# Patient Record
Sex: Male | Born: 1945
Health system: Southern US, Community
[De-identification: ages and names within clinical notes are randomized; demographics above are authoritative.]

## PROBLEM LIST (undated history)

## (undated) DIAGNOSIS — I10 Essential (primary) hypertension: Secondary | ICD-10-CM

## (undated) DIAGNOSIS — I519 Heart disease, unspecified: Secondary | ICD-10-CM

## (undated) DIAGNOSIS — C801 Malignant (primary) neoplasm, unspecified: Secondary | ICD-10-CM

## (undated) DIAGNOSIS — I2510 Atherosclerotic heart disease of native coronary artery without angina pectoris: Secondary | ICD-10-CM

## (undated) HISTORY — PX: SHOULDER SURGERY: SHX246

## (undated) HISTORY — DX: Atherosclerotic heart disease of native coronary artery without angina pectoris: I25.10

## (undated) HISTORY — DX: Heart disease, unspecified: I51.9

---

## 2001-03-06 ENCOUNTER — Encounter: Payer: Self-pay | Admitting: *Deleted

## 2001-03-06 ENCOUNTER — Emergency Department (HOSPITAL_COMMUNITY): Admission: EM | Admit: 2001-03-06 | Discharge: 2001-03-06 | Payer: Self-pay | Admitting: *Deleted

## 2002-03-29 ENCOUNTER — Encounter: Payer: Self-pay | Admitting: Internal Medicine

## 2002-03-29 ENCOUNTER — Observation Stay (HOSPITAL_COMMUNITY): Admission: AD | Admit: 2002-03-29 | Discharge: 2002-03-30 | Payer: Self-pay | Admitting: Internal Medicine

## 2002-10-01 ENCOUNTER — Emergency Department (HOSPITAL_COMMUNITY): Admission: EM | Admit: 2002-10-01 | Discharge: 2002-10-01 | Payer: Self-pay | Admitting: *Deleted

## 2002-10-01 ENCOUNTER — Encounter: Payer: Self-pay | Admitting: *Deleted

## 2002-10-04 ENCOUNTER — Emergency Department (HOSPITAL_COMMUNITY): Admission: EM | Admit: 2002-10-04 | Discharge: 2002-10-04 | Payer: Self-pay | Admitting: *Deleted

## 2003-07-30 ENCOUNTER — Ambulatory Visit (HOSPITAL_COMMUNITY): Admission: RE | Admit: 2003-07-30 | Discharge: 2003-07-30 | Payer: Self-pay | Admitting: Family Medicine

## 2004-07-31 ENCOUNTER — Ambulatory Visit (HOSPITAL_COMMUNITY): Admission: RE | Admit: 2004-07-31 | Discharge: 2004-07-31 | Payer: Self-pay | Admitting: Family Medicine

## 2004-08-25 ENCOUNTER — Ambulatory Visit (HOSPITAL_COMMUNITY): Admission: RE | Admit: 2004-08-25 | Discharge: 2004-08-25 | Payer: Self-pay | Admitting: General Surgery

## 2008-11-30 ENCOUNTER — Emergency Department (HOSPITAL_COMMUNITY): Admission: EM | Admit: 2008-11-30 | Discharge: 2008-11-30 | Payer: Self-pay | Admitting: Emergency Medicine

## 2009-02-19 ENCOUNTER — Emergency Department (HOSPITAL_COMMUNITY): Admission: EM | Admit: 2009-02-19 | Discharge: 2009-02-19 | Payer: Self-pay | Admitting: Internal Medicine

## 2009-02-20 ENCOUNTER — Emergency Department (HOSPITAL_COMMUNITY): Admission: EM | Admit: 2009-02-20 | Discharge: 2009-02-20 | Payer: Self-pay | Admitting: Emergency Medicine

## 2010-04-27 LAB — BASIC METABOLIC PANEL
Calcium: 9.8 mg/dL (ref 8.4–10.5)
GFR calc Af Amer: >60 mL/min (ref >60)
Potassium: 3.3 mEq/L — ABNORMAL LOW (ref 3.5–5.1)
Sodium: 135 mEq/L (ref 135–145)

## 2010-04-27 LAB — CBC
Hemoglobin: 14.5 g/dL (ref 13.0–17.0)
MCV: 93.4 fL (ref 78.0–100.0)
Platelets: 179 10*3/uL (ref 150–400)
RDW: 13.2 % (ref 11.5–15.5)
WBC: 7.9 10*3/uL (ref 4.0–10.5)

## 2010-04-27 LAB — DIFFERENTIAL
Basophils Relative: 0 % (ref 0–1)
Neutrophils Relative %: 86 % — ABNORMAL HIGH (ref 43–77)

## 2010-05-15 LAB — CBC
HCT: 43.3 % (ref 39.0–52.0)
Hemoglobin: 15.2 g/dL (ref 13.0–17.0)
Platelets: 207 10*3/uL (ref 150–400)
RDW: 13.4 % (ref 11.5–15.5)
WBC: 13.7 10*3/uL — ABNORMAL HIGH (ref 4.0–10.5)

## 2010-05-15 LAB — COMPREHENSIVE METABOLIC PANEL
ALT: 43 U/L (ref 0–53)
AST: 33 U/L (ref 0–37)
Albumin: 4.3 g/dL (ref 3.5–5.2)
BUN: 11 mg/dL (ref 6–23)
Calcium: 9.7 mg/dL (ref 8.4–10.5)
Creatinine, Ser: 0.86 mg/dL (ref 0.4–1.5)
GFR calc non Af Amer: >60 mL/min (ref >60)
Glucose, Bld: 140 mg/dL — ABNORMAL HIGH (ref 70–99)
Potassium: 4.1 mEq/L (ref 3.5–5.1)
Total Protein: 7 g/dL (ref 6.0–8.3)

## 2010-05-15 LAB — DIFFERENTIAL
Basophils Absolute: 0 10*3/uL (ref 0.0–0.1)
Eosinophils Relative: 0 % (ref 0–5)
Monocytes Relative: 2 % — ABNORMAL LOW (ref 3–12)
Neutrophils Relative %: 93 % — ABNORMAL HIGH (ref 43–77)

## 2010-06-27 NOTE — Consult Note (Signed)
[name], [name]                        ACCOUNT NO.:  [id number]   MEDICAL RECORD NO.:  [id number]                   PATIENT TYPE:  OBV   LOCATION:  A301                                 FACILITY:  [facility]   PHYSICIAN:  Dennie Maizes, M.D.                DATE OF BIRTH:  26-Jan-1946   DATE OF CONSULTATION:  03/30/2002  DATE OF DISCHARGE:                                   CONSULTATION   REPORT TITLE:  UROLOGY CONSULTATION   REASON FOR CONSULTATION:  Right distal ureteral calculus with obstruction.   HISTORY OF PRESENT ILLNESS:  This 65 year old male has a past history of  recurrent urolithiasis.  He experienced severe right focal abdominal pain  yesterday.  He had relief of pain after some time.  He had recurrent severe  pain this morning associated with nausea.  He came to the emergency room for  further evaluation.  He had passed a stone several years ago.  The patient  felt the stone was typical for his stone pain he had before.  He was  evaluated by Dr. Malvin Johns for possible appendicitis.  The patient has been  admitted to the hospital for observation.   CT scan of the abdomen and pelvis with contrast revealed no evidence of  appendicitis.  The patient had bilateral renal calculi as well  a 5 x 3 mm  size right distal ureteral calculus with obstruction and mild  hydronephrosis.  I was asked to see the patient for further evaluation and  management.  The patient is not having any fever, chills, voiding  difficulty, or gross hematuria.  He has some nausea.  His pain is adequately  controlled with pain narcotics.   PAST MEDICAL HISTORY:  1. History of recurrent urolithiasis.  2. Hypertension.   MEDICATIONS:  Diovan 80 __________  one p.o. daily.   ALLERGIES:  PENICILLIN.   PHYSICAL EXAMINATION:  GENERAL:  The patient is comfortable after receiving  pain narcotics.  ABDOMEN:  Soft.  No palpable flank mass.  No CVA tenderness.  No local  abdominal tenderness.  Bladder not  palpable.  GENITOURINARY:  Penis and testes are normal.  RECTAL:  Not done.  Rectal examination by Dr. Malvin Johns revealed a normal  prostate.   ADMISSION LABORATORY DATA:  CBC:  WBC 9.7, hemoglobin 14.8, hematocrit 42.4.  BUN 20, creatinine 1.2.  Urinalysis revealed small blood, RBCs 7-10/hpf.   CT of the abdomen and pelvis revealed bilateral small renal calculi.  There  is a 5 x 3 mm size right distal ureteral calculus at the level of the  ureterosacral junction with obstruction and mild hydronephrosis.   IMPRESSIONS:  1. Right distal ureteral calculus with obstruction.  2. Right hydronephrosis.  3. Right renal colic.   PLAN:  1. Continue IV fluids and pain narcotics.  2. Discussed with the patient and his wife regarding management options.  He     has a  good chance of passing this stone spontaneously.  The patient wants     to wait and pass the stone.  3. The patient will be discharged in the a.m. if he is pain free.   Thanks for this consult.                                               Dennie Maizes, M.D.    SK/MEDQ  D:  03/29/2002  T:  03/29/2002  Job:  [id number]   cc:   Barbaraann Barthel, M.D.  [address]  [place]  [place] [address]  Fax: [phone]

## 2010-06-27 NOTE — Consult Note (Signed)
[name], [name]                        ACCOUNT NO.:  [id number]   MEDICAL RECORD NO.:  [id number]                   PATIENT TYPE:  EMS   LOCATION:  ED                                   FACILITY:  [facility]   PHYSICIAN:  Barbaraann Barthel, M.D.              DATE OF BIRTH:  24-Oct-1945   DATE OF CONSULTATION:  DATE OF DISCHARGE:                                   CONSULTATION   REASON FOR CONSULTATION:  Surgery was asked to see this 65 year old white  male for right lower quadrant pain.   CHIEF COMPLAINT:  Right lower quadrant pain and nausea.   HISTORY OF PRESENT MEDICAL ILLNESS:  The patient states that he has  approximately a 24-hour history of right lower quadrant pain.  This began  yesterday morning when he awoke.  He had some right lower quadrant pain.  He  drank a little V8 juice and the pain continued and got worse later on in the  morning and then abated.  This pain was described as crampy and localized to  the right lower quadrant.  This pain abated somewhat and he was able to eat  lunch and a little supper, however, he awoke the next morning with continued  pain and he sought attention in the emergency room.  Surgery was consulted  and responded immediately.   PHYSICAL EXAMINATION:  GENERAL:  Discloses a pleasant 65 year old white  male, uncomfortable, but in no acute distress.   VITAL SIGNS:  He is approximately 5 feet 7 inches, weighs 170 pounds.  His  blood pressure is 138/67, heart rate 52 per minute, respirations 16 per  minute.   HEENT:  The head is normocephalic.  Eyes, extraocular movements intact,  pupils equal, round, reactive to light and accommodation, there is no  conjunctive pallor or scleral injection.  Sclerae is a normal tincture.  There are no bruits auscultated and there is no adenopathy.   CHEST:  Clear to both anterior and posterior auscultation.   HEART:  Regular rhythm.   ABDOMEN:  Bowel sounds are diminished.  The patient has right lower  quadrant  pain with localized tenderness.  No hernias are appreciated.   RECTAL:  The prostate is smooth and stool is guaiac negative.   EXTREMITIES:  These are within normal limits.  The patient has had previous  left shoulder surgery.  Other than that, he is moving all extremities  purposefully.   REVIEW OF SYSTEMS:  GASTROINTESTINAL:  Right lower quadrant pain with nausea  and anorexia and no past history of hepatitis.  No past history of change in  bowel habits.  The patient moved his bowels today.  There was no diarrhea,  no bright red rectal bleeding or melena.  No past history of hepatitis.  No  past history of unexplained weight loss.  GU:  No history of dysuria.  The  patient has had a past history of nephrolithiasis in  the past, however, he  states that the pain he is having now is not at all like previous renal  colic that is known to him.  ENDOCRINE:  No history of diabetes or thyroid  disease.  CARDIOVASCULAR:  The patient is a nonsmoker, nondrinker.  He has a  history of hypertension.  He takes Diovan 80 mg/12.5 daily.  He has no past  history of myocardial infarction.  His EKG shows sinus bradycardia.  This  was after he was administered Demerol.  MUSCULOSKELETAL:  As stated, past  history of left shoulder surgery.   LABORATORY DATA:  The patient has a white count of 9.7 with an H&H of 14.8  and 42.4 with normal platelet count of 193,000 with 81 neutrophils noted.  Amylase is not elevated.  Lipase is also not elevated.  Liver function  studies and electrolytes are all grossly within normal limits.  The patient  has been sent by the emergency room physician to have a CT scan.  This is  pending at present.   IMPRESSION:  Acute appendicitis.  The patient has signs and symptoms, to me,  suggestive of this at approximately 24 hours duration.   PLAN:  Continue IV hydration.  We will initiate IV antibiotics.  We will  plan for a laparoscopic or open appendectomy as explained  in detail to the  family.  Informed consent was obtained and all questions were answered.                                               Barbaraann Barthel, M.D.    WB/MEDQ  D:  03/29/2002  T:  03/29/2002  Job:  [id number]

## 2010-06-27 NOTE — H&P (Signed)
NAMEKLAUS, CASTENEDA            ACCOUNT NO.:  [id number]   MEDICAL RECORD NO.:  [id number]          PATIENT TYPE:  AMB   LOCATION:                                FACILITY:  [facility]   PHYSICIAN:  Dalia Heading, M.D.  DATE OF BIRTH:  11-Oct-1945   DATE OF ADMISSION:  08/25/2004  DATE OF DISCHARGE:  LH                                HISTORY & PHYSICAL   CHIEF COMPLAINT:  Need for screening colonoscopy.   HISTORY OF PRESENT ILLNESS:  The patient is a 65 year old, white male who  was referred for endoscopic evaluation.  He needs a colonoscopy for  screening purposes.  He has had no abdominal pain, weight loss, nausea,  vomiting, diarrhea, constipation, melena or hematochezia have been noted.  He has never had a colonoscopy.  There is no family history of colon  carcinoma.   PAST MEDICAL HISTORY:  Hypertension.   PAST SURGICAL HISTORY:  Left shoulder surgery.   CURRENT MEDICATIONS:  Diovan.   ALLERGIES:  CLARINEX.   REVIEW OF SYSTEMS:  Noncontributory.   PHYSICAL EXAMINATION:  GENERAL:  Well-developed, well-nourished, white male  in no acute distress.  VITAL SIGNS:  Stable.  LUNGS:  Clear to auscultation with equal breath sounds bilaterally.  HEART:  Regular rate and rhythm without S3, S4 or murmurs.  ABDOMEN:  Soft, nontender, nondistended.  No hepatosplenomegaly or masses  are noted  RECTAL:  Deferred to the procedure.   IMPRESSION:  Need for screening colonoscopy.   PLAN:  The patient was scheduled for a colonoscopy on August 25, 2004.  The  risks and benefits of the procedure including bleeding and perforation were  fully explained to the patient gaining informed consent.       MAJ/MEDQ  D:  08/21/2004  T:  08/21/2004  Job:  [id number]   cc:   Corrie Mckusick, M.D.  Fax: [phone]

## 2010-06-27 NOTE — Procedures (Signed)
   [name], [name]                        ACCOUNT NO.:  [id number]   MEDICAL RECORD NO.:  [id number]                   PATIENT TYPE:  OBV   LOCATION:  A301                                 FACILITY:  [facility]   PHYSICIAN:  Edward L. Juanetta Gosling, M.D.             DATE OF BIRTH:  May 16, 1945   DATE OF PROCEDURE:  03/29/2002  DATE OF DISCHARGE:                                EKG INTERPRETATION   INTERPRETATION:  The rhythm is sinus rhythm with a bradycardic rate in the  40s.                                               Edward L. Juanetta Gosling, M.D.    ELH/MEDQ  D:  03/29/2002  T:  03/29/2002  Job:  [id number]

## 2010-08-01 IMAGING — CT CT HEAD W/O CM
1 series · 16 of 30 positions shown, 20 images · non-contrast
Comparison: None

CLINICAL DATA: High blood pressure.  Headache.

CT HEAD WITHOUT CONTRAST
TECHNIQUE: Contiguous axial images were obtained from the base of
the skull through the vertex without contrast.

[Series 2: headseq 4.8 h37s · axial · 0.45mm/px · z∈[+76,+236]mm · 16 of 36 slices shown, 20 images]
[im 2/36  brain]
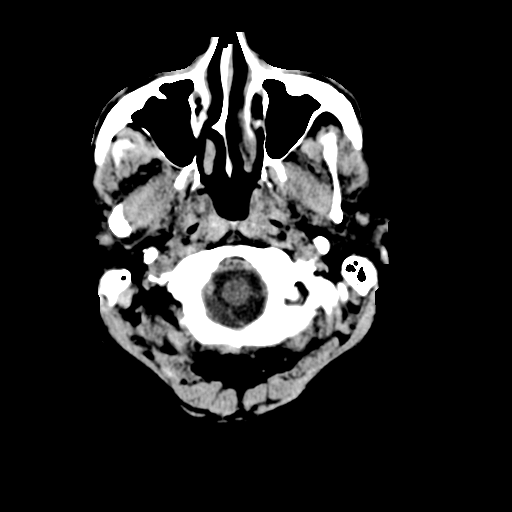
[im 2/36  bone]
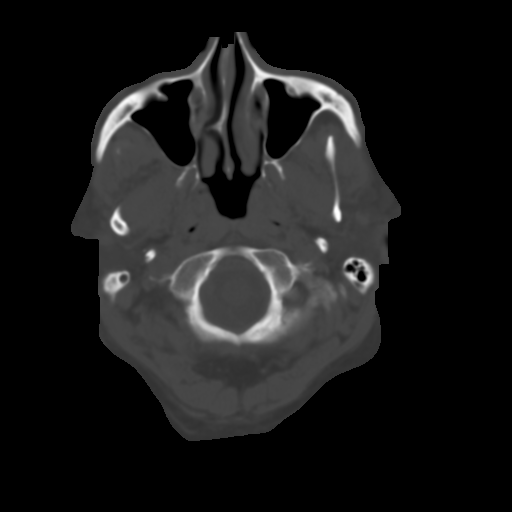
[im 4/36  brain]
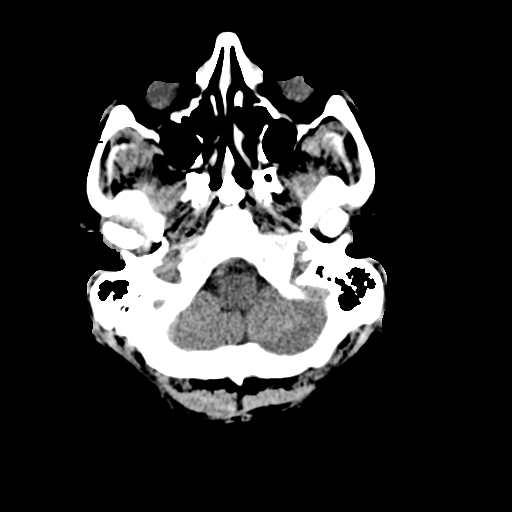
[im 7/36  brain]
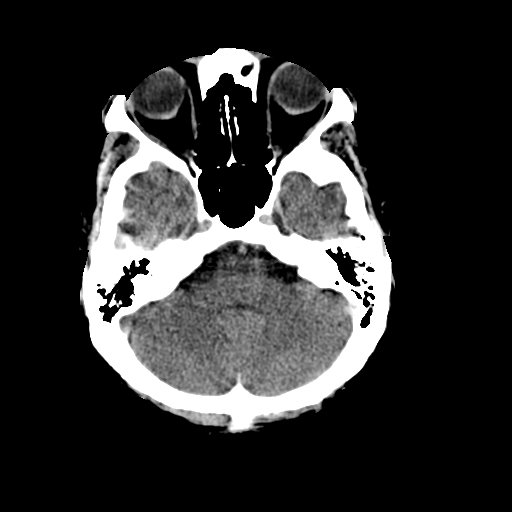
[im 9/36  brain]
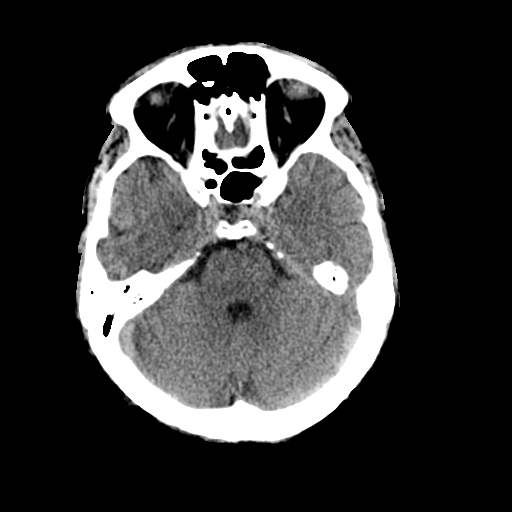
[im 10/36  brain]
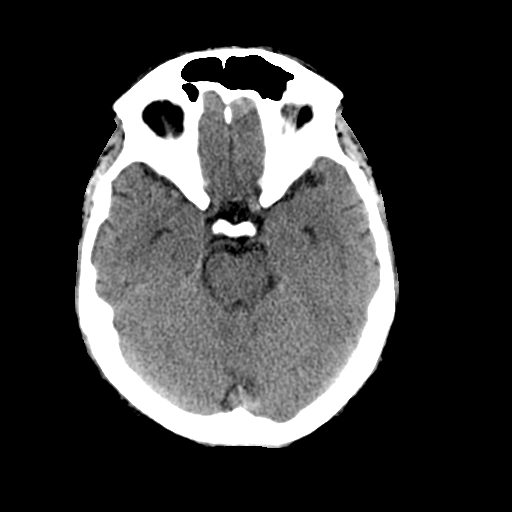
[im 10/36  bone]
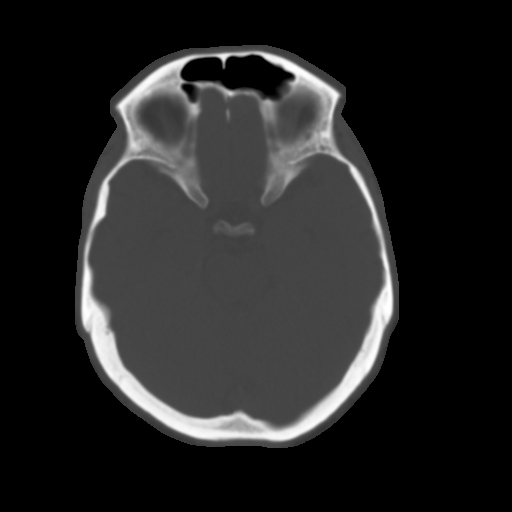
[im 13/36  brain]
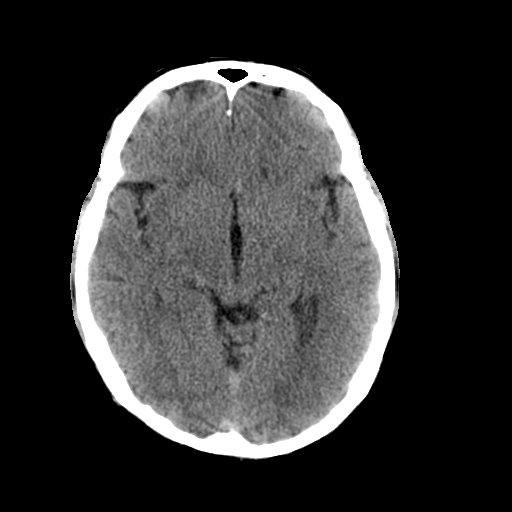
[im 15/36  brain]
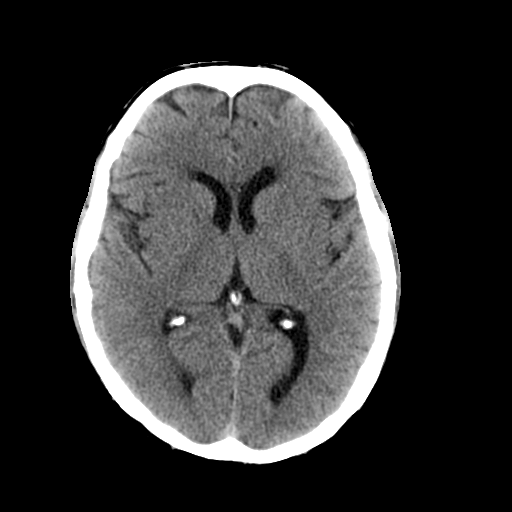
[im 17/36  brain]
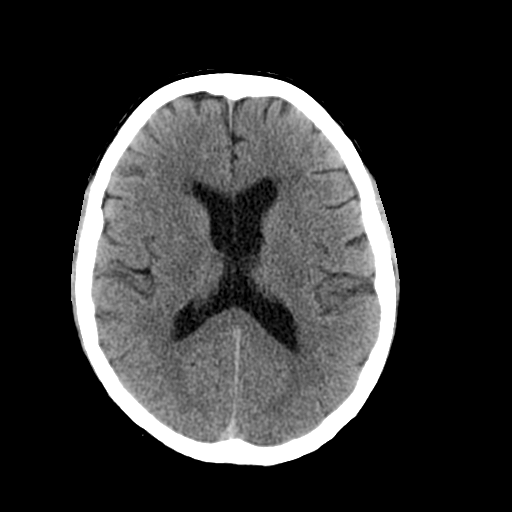
[im 19/36  brain]
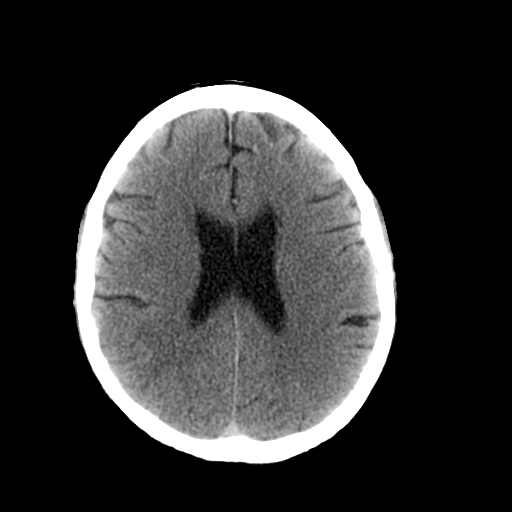
[im 19/36  bone]
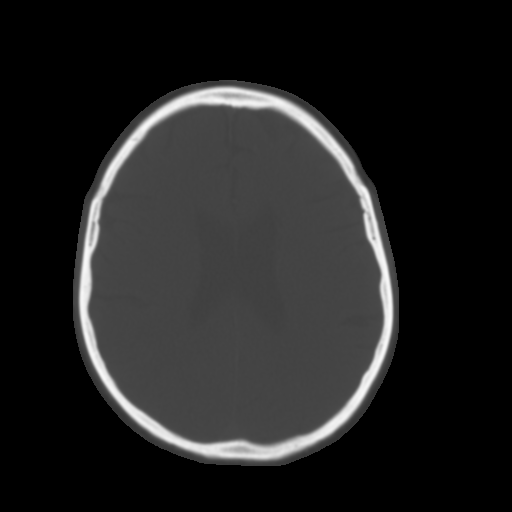
[im 21/36  brain]
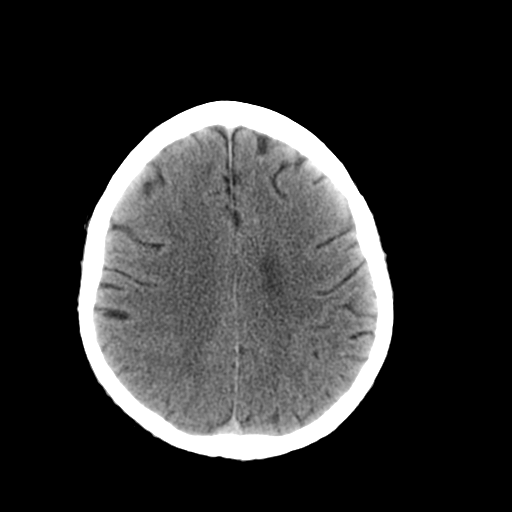
[im 23/36  brain]
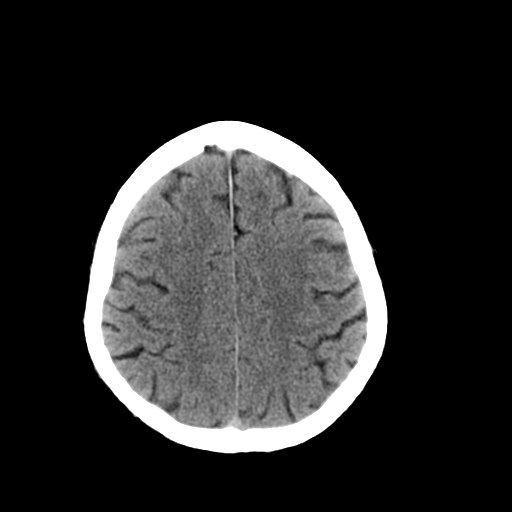
[im 26/36  brain]
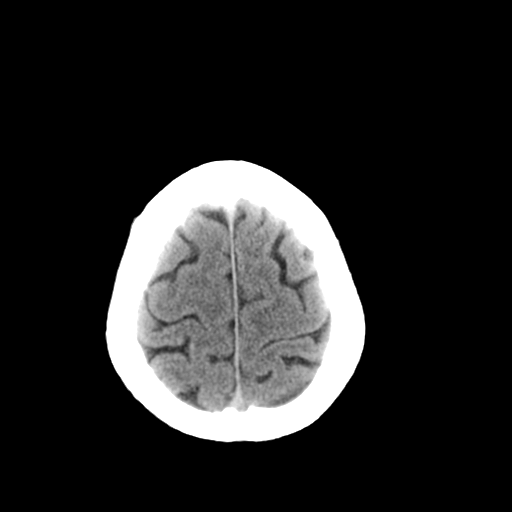
[im 27/36  brain]
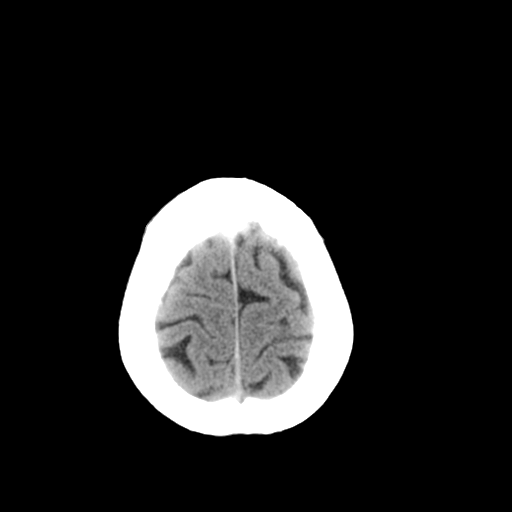
[im 27/36  bone]
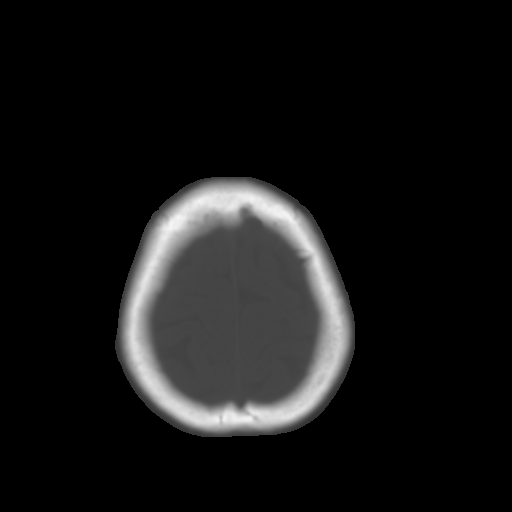
[im 29/36  brain]
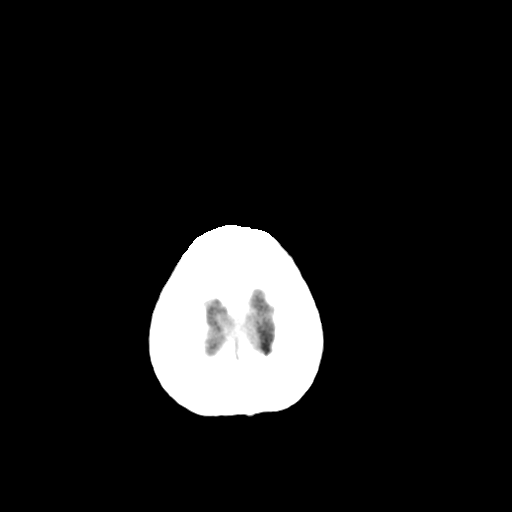
[im 32/36  brain]
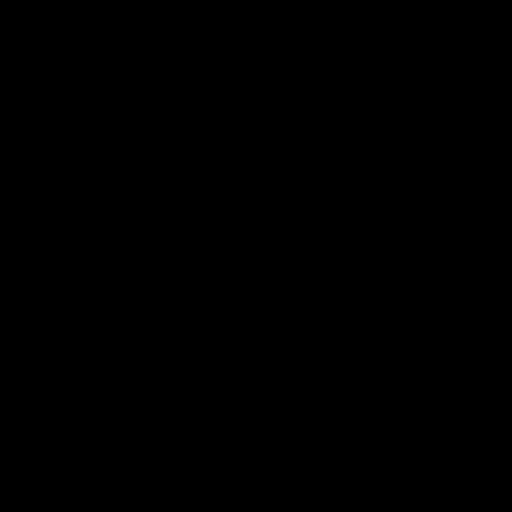
[im 34/36  brain]
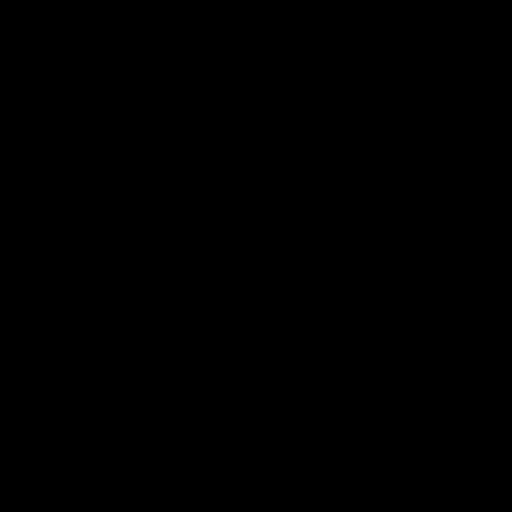

[16 of 30 positions shown; findings below may reference images not displayed]

FINDINGS: There is no intra or extra-axial fluid collection or mass
lesion.  The basilar cisterns and ventricles have a normal
appearance.  There is no CT evidence for acute infarction or
hemorrhage.

Bone windows show no calvarial fracture.
IMPRESSION: No evidence for acute intracranial abnormality.

## 2011-05-15 ENCOUNTER — Ambulatory Visit (HOSPITAL_COMMUNITY)
Admission: RE | Admit: 2011-05-15 | Discharge: 2011-05-15 | Disposition: A | Discharge status: Home / Self Care | Payer: Medicare Other | Source: Ambulatory Visit | Attending: Orthopedic Surgery | Admitting: Orthopedic Surgery

## 2011-05-15 DIAGNOSIS — IMO0001 Reserved for inherently not codable concepts without codable children: Secondary | ICD-10-CM | POA: Insufficient documentation

## 2011-05-15 DIAGNOSIS — M7540 Impingement syndrome of unspecified shoulder: Secondary | ICD-10-CM | POA: Insufficient documentation

## 2011-05-15 DIAGNOSIS — M25519 Pain in unspecified shoulder: Secondary | ICD-10-CM | POA: Insufficient documentation

## 2011-05-15 DIAGNOSIS — M6281 Muscle weakness (generalized): Secondary | ICD-10-CM | POA: Insufficient documentation

## 2011-05-15 DIAGNOSIS — M25619 Stiffness of unspecified shoulder, not elsewhere classified: Secondary | ICD-10-CM | POA: Insufficient documentation

## 2011-05-15 NOTE — Evaluation (Signed)
Occupational Therapy Evaluation  Patient Details  Name: [name] MRN: [id number] Date of Birth: Sep 29, 1945  Today's Date: 05/15/2011 Time: 4098-1191 Time Calculation (min): 40 min OT Eval 850-910 Manual Therapy 910-930 20'  Visit#: 1  of 24   Re-eval: 06/12/11  Assessment Diagnosis: Right SAD/DCE Surgical Date: 05/05/11 Next MD Visit: one month Prior Therapy: no  Past Medical History: No past medical history on file. Past Surgical History: No past surgical history on file.  Subjective Symptoms/Limitations Symptoms: S:  I had surgery on 05/05/11.  I want to get back to playing golf. Limitations: [name] reports that he hurt his right shoulder in 2008.  This winter, he slipped and fell on porch steps, catching his right arm in the hand rail.  He consulted with Dr. Rennis Chris and had surgery on his right shoulder on 05/05/11.  He has been referred to occupational therapy for evaluation and treatment.   Pain Assessment Currently in Pain?: Yes Pain Score:   3 Pain Location: Shoulder Pain Orientation: Right Pain Type: Surgical pain  Precautions/Restrictions   N/A  Prior Functioning  Home Living Additional Comments: lives with his wife Prior Function Comments: enjoys golfing, gardening, traveling  Assessment ADL/Vision/Perception ADL ADL Comments: R handed.  Can not reach overhead or into ext rotation  Cognition/Observation Cognition Overall Cognitive Status: Appears within functional limits for tasks assessed Observation/Other Assessments Other Assessments: 64/80 on UEFI   Additional Assessments RUE AROM (degrees) RUE Overall AROM Comments: assessed in supine, ER and IR with shoulder adducted strength deferred Right Shoulder Flexion  0-170: 125 Degrees Right Shoulder ABduction 0-140: 100 Degrees Right Shoulder Internal Rotation  0-70: 35 Degrees Right Shoulder External Rotation  0-90: 40 Degrees RUE PROM (degrees) Right Shoulder Flexion  0-170: 125  Degrees Right Shoulder ABduction 0-140: 125 Degrees Right Shoulder Internal Rotation  0-70: 35 Degrees Right Shoulder External Rotation  0-90: 40 Degrees Palpation Palpation: Min-mod fascial restrictions noted with healing scope incisions  Exercise/Treatments Supine Protraction: PROM;10 reps Horizontal ABduction: PROM;10 reps External Rotation: PROM;10 reps Internal Rotation: PROM;10 reps Flexion: PROM;10 reps ABduction: PROM;10 reps    Manual Therapy Manual Therapy: Myofascial release Myofascial Release: MFR and manual stretching to right upper arm, shoulder, and scapular Copeland to decrease pain and increase mobility.  910-930  Occupational Therapy Assessment and Plan OT Assessment and Plan Clinical Impression Statement: A:  Patient presents with decreased A/PROM and strength and increased pain and fascial restrictions causing decreased I with B/IADLs and leisure activities. Rehab Potential: Excellent OT Frequency: Min 3X/week OT Duration: 8 weeks OT Treatment/Interventions: Self-care/ADL training;Therapeutic exercise;Manual therapy;Modalities;Patient/family education OT Plan: P:  Skilled OT intervention to increase AROM and strength and decrease pain and restrictions.  Treatment Plan:  MFR and manual stretching to right shoulder.  Supine dowel progress to AROM as tolerated.  seated elev, ext, ret, row.  ball flex abd, thumbtacks, prot/ret//elev/dep, pulleys,.  Progress as tolerated.   Goals Short Term Goals Time to Complete Short Term Goals: 4 weeks Short Term Goal 1: Patient will be educated on a HEP. Short Term Goal 2: Patient will increase PROM in right shoulder to [name] for increased ability to swing golf club. Short Term Goal 3: Patient will increase right shoulder strength to 3+/5 for increased ability to lift gardening tools. Short Term Goal 4: Patient will decrease pain to 2/10 when reaching overhead. Short Term Goal 5: Patient will decrease fascial restrictions to minimal  in his right shoulder Copeland. Long Term Goals Time to Complete Long Term Goals:  8 weeks Long Term Goal 1: Patient will return to prior level of I with all B/IADLs, work, and leisure activities. Long Term Goal 2: Patient will increase right shoulder AROM to WNL for increased ability to swing golf club. Long Term Goal 3: Patient will have 5/5 strength in his right shoulder for increased ability to lift gardening tools. Long Term Goal 4: Patient will have 1/10 pain in his right shoulder when hitting golf balls. Long Term Goal 5: Patient will have trace fascial restrictions in his right shoulder Copeland.  Problem List Patient Active Problem List  Diagnoses  . Impingement syndrome of shoulder Copeland  . Pain in joint, shoulder Copeland  . Muscle weakness (generalized)    End of Session Activity Tolerance: Patient tolerated treatment well General Behavior During Session: [name] for tasks performed Cognition: [name] for tasks performed OT Plan of Care OT Home Exercise Plan: Towel slides and shoulder stretches.  GO  Functional Reporting Modifier  Current Status  [phone] - Carrying, Moving and Handling Objects CJ - At least 20% but less than 40% impaired, limited or restricted  Goal Status  [phone] - Carrying, Moving and Handling Objects CI - At least 1% but less than 20% impaired, limited or restricted   [name], OTR/L  05/15/2011, 2:39 PM  Physician Documentation Your signature is required to indicate approval of the treatment plan as stated above.  Please sign and either send electronically or make a copy of this report for your files and return this physician signed original.  Please mark one 1.__approve of plan  2. ___approve of plan with the following conditions.   ______________________________                                                          _____________________ Physician Signature                                                                                                              Date

## 2011-05-19 ENCOUNTER — Ambulatory Visit (HOSPITAL_COMMUNITY)
Admission: RE | Admit: 2011-05-19 | Discharge: 2011-05-19 | Disposition: A | Discharge status: Home / Self Care | Payer: Medicare Other | Source: Ambulatory Visit | Attending: Orthopedic Surgery | Admitting: Orthopedic Surgery

## 2011-05-19 DIAGNOSIS — M7540 Impingement syndrome of unspecified shoulder: Secondary | ICD-10-CM

## 2011-05-19 DIAGNOSIS — M25519 Pain in unspecified shoulder: Secondary | ICD-10-CM

## 2011-05-19 DIAGNOSIS — M6281 Muscle weakness (generalized): Secondary | ICD-10-CM

## 2011-05-19 NOTE — Progress Notes (Signed)
Occupational Therapy Treatment  Patient Details  Name: [name] MRN: [id number] Date of Birth: 1945/08/10  Today's Date: 05/19/2011 Time: 6213-0865 Time Calculation (min): 47 min Manual Therapy 850-910 20' Therapeutic Exercises 910-93 39'  Visit#: 2  of 24   Re-eval: 06/12/11    Subjective Symptoms/Limitations Symptoms: S:  I didnt have any problem with the exercises. Pain Assessment Currently in Pain?: Yes Pain Score:   3 Pain Location: Shoulder Pain Orientation: Right Pain Type: Surgical pain  Precautions/Restrictions     Exercise/Treatments Supine Protraction: PROM;AAROM;10 reps Horizontal ABduction: PROM;AAROM;10 reps External Rotation: PROM;AAROM;10 reps Internal Rotation: PROM;AAROM;10 reps Flexion: PROM;AAROM;10 reps ABduction: PROM;AAROM;10 reps Seated Elevation: AROM;10 reps Extension: AROM;10 reps Row: AROM;10 reps Pulleys Flexion: 2 minutes ABduction: 2 minutes Therapy Ball Flexion: 20 reps ABduction: 20 reps    Manual Therapy Manual Therapy: Myofascial release Myofascial Release: MFR and manual stretching to right upper arm, shoulder, and scapular region to decrease pain and increase mobility.  Scar release to each incision to decrease scar adherance and improve mobility.  850-910  Occupational Therapy Assessment and Plan OT Assessment and Plan Clinical Impression Statement: A:  Flexion and abduction painful at end range passive range of motion.   OT Plan: P:  Add thumbtacks, prot/ret//elev/dep, increase dowel reps as tolerated.   Goals Short Term Goals Time to Complete Short Term Goals: 4 weeks Short Term Goal 1: Patient will be educated on a HEP. Short Term Goal 1 Progress: Progressing toward goal Short Term Goal 2: Patient will increase PROM in right shoulder to [facility] for increased ability to swing golf club. Short Term Goal 2 Progress: Progressing toward goal Short Term Goal 3: Patient will increase right shoulder strength to 3+/5  for increased ability to lift gardening tools. Short Term Goal 3 Progress: Progressing toward goal Short Term Goal 4: Patient will decrease pain to 2/10 when reaching overhead. Short Term Goal 4 Progress: Progressing toward goal Short Term Goal 5: Patient will decrease fascial restrictions to minimal in his right shoulder region. Short Term Goal 5 Progress: Progressing toward goal Long Term Goals Time to Complete Long Term Goals: 8 weeks Long Term Goal 1: Patient will return to prior level of I with all B/IADLs, work, and leisure activities. Long Term Goal 1 Progress: Progressing toward goal Long Term Goal 2: Patient will increase right shoulder AROM to WNL for increased ability to swing golf club. Long Term Goal 2 Progress: Progressing toward goal Long Term Goal 3: Patient will have 5/5 strength in his right shoulder for increased ability to lift gardening tools. Long Term Goal 3 Progress: Progressing toward goal Long Term Goal 4: Patient will have 1/10 pain in his right shoulder when hitting golf balls. Long Term Goal 4 Progress: Progressing toward goal Long Term Goal 5: Patient will have trace fascial restrictions in his right shoulder region. Long Term Goal 5 Progress: Progressing toward goal  Problem List Patient Active Problem List  Diagnoses  . Impingement syndrome of shoulder region  . Pain in joint, shoulder region  . Muscle weakness (generalized)    End of Session Activity Tolerance: Patient tolerated treatment well General Behavior During Session: [facility] for tasks performed Cognition: [facility] for tasks performed  GO No functional reporting required  Shirlean Mylar, OTR/L  05/19/2011, 10:32 AM

## 2011-05-21 ENCOUNTER — Ambulatory Visit (HOSPITAL_COMMUNITY)
Admission: RE | Admit: 2011-05-21 | Discharge: 2011-05-21 | Disposition: A | Discharge status: Home / Self Care | Payer: Medicare Other | Source: Ambulatory Visit | Attending: Specialist | Admitting: Specialist

## 2011-05-21 NOTE — Progress Notes (Signed)
Occupational Therapy Treatment  Patient Details  Name: [name] MRN: [id number] Date of Birth: May 05, 1945  Today's Date: 05/21/2011 Time: 4098-1191 Time Calculation (min): 51 min Manual Therapy 478-295 27' Therapeutic Ex 621-308 24' Visit#: 3  of 24   Re-eval: 06/12/11    Subjective Symptoms/Limitations Symptoms: S:  I mowed the grass yesterday and I can feel that I worked my arm. Pain Assessment Currently in Pain?: Yes Pain Score:   2 Pain Location: Shoulder Pain Orientation: Right Pain Type: Surgical pain;Acute pain  Precautions/Restrictions     Exercise/Treatments Supine Protraction: PROM;AROM;10 reps Horizontal ABduction: PROM;AROM;10 reps External Rotation: PROM;AROM;10 reps Internal Rotation: PROM;AROM;10 reps Flexion: PROM;AROM;10 reps ABduction: PROM;AROM;10 reps Seated Elevation: AROM;15 reps Extension: AROM;15 reps Retraction: AROM;15 reps Row: AROM;15 reps   Pulleys Flexion: 2 minutes ABduction: 2 minutes Therapy Ball Flexion: 20 reps ABduction: 20 reps ROM / Strengthening / Isometric Strengthening Wall Wash: 1' Thumb Tacks: 1' Prot/Ret//Elev/Dep: 1'         Manual Therapy Manual Therapy: Myofascial release Myofascial Release: MFR and manual stretching to right upper  arm, shoulder, and scapular region to decrease pain and increase mobility.  Scar release to each incision to decrease scar adherarnce and improve mobility. [phone]  Occupational Therapy Assessment and Plan OT Assessment and Plan Clinical Impression Statement: A:  Patient transitioned from dowel rod AAROM to AROM in supine.  Flexion and abduction were most difficult movements and he required assistance with abduction. OT Plan: P:  Complete AROM abduction in supine without assistance.   Goals Short Term Goals Time to Complete Short Term Goals: 4 weeks Short Term Goal 1: Patient will be educated on a HEP. Short Term Goal 2: Patient will increase PROM in right shoulder  to [facility] for increased ability to swing golf club. Short Term Goal 3: Patient will increase right shoulder strength to 3+/5 for increased ability to lift gardening tools. Short Term Goal 4: Patient will decrease pain to 2/10 when reaching overhead. Short Term Goal 5: Patient will decrease fascial restrictions to minimal in his right shoulder region. Long Term Goals Time to Complete Long Term Goals: 8 weeks Long Term Goal 1: Patient will return to prior level of I with all B/IADLs, work, and leisure activities. Long Term Goal 2: Patient will increase right shoulder AROM to WNL for increased ability to swing golf club. Long Term Goal 3: Patient will have 5/5 strength in his right shoulder for increased ability to lift gardening tools. Long Term Goal 4: Patient will have 1/10 pain in his right shoulder when hitting golf balls. Long Term Goal 5: Patient will have trace fascial restrictions in his right shoulder region.  Problem List Patient Active Problem List  Diagnoses  . Impingement syndrome of shoulder region  . Pain in joint, shoulder region  . Muscle weakness (generalized)    End of Session Activity Tolerance: Patient tolerated treatment well General Behavior During Session: [facility] for tasks performed Cognition: [facility] for tasks performed  GO No functional reporting required  Shirlean Mylar, OTR/L  05/21/2011, 9:45 AM

## 2011-05-22 ENCOUNTER — Ambulatory Visit (HOSPITAL_COMMUNITY)
Admission: RE | Admit: 2011-05-22 | Discharge: 2011-05-22 | Disposition: A | Discharge status: Home / Self Care | Payer: Medicare Other | Source: Ambulatory Visit | Attending: Family Medicine | Admitting: Family Medicine

## 2011-05-22 ENCOUNTER — Ambulatory Visit (HOSPITAL_COMMUNITY): Payer: Self-pay | Admitting: Specialist

## 2011-05-22 DIAGNOSIS — M6281 Muscle weakness (generalized): Secondary | ICD-10-CM

## 2011-05-22 DIAGNOSIS — M7540 Impingement syndrome of unspecified shoulder: Secondary | ICD-10-CM

## 2011-05-22 DIAGNOSIS — M25519 Pain in unspecified shoulder: Secondary | ICD-10-CM

## 2011-05-22 NOTE — Progress Notes (Signed)
Occupational Therapy Treatment  Patient Details  Name: [name] MRN: [id number] Date of Birth: August 28, 1945  Today's Date: 05/22/2011 Time: 5784-6962 Time Calculation (min): 48 min Manual Therapy [id number]' Therapeutic Exercise 0102-7253 30' Visit#: 4  of 24   Re-eval: 06/12/11    Subjective Symptoms/Limitations Symptoms: S:  Its feeling pretty good. Pain Assessment Currently in Pain?: Yes Pain Score:   1 Pain Location: Shoulder Pain Orientation: Right Pain Type: Acute pain  Precautions/Restrictions     Exercise/Treatments   05/22/11 0700 Shoulder Exercises: Supine Protraction PROM;10 reps;AROM;12 reps Horizontal ABduction PROM;10 reps;AROM;12 reps External Rotation PROM;10 reps;AROM;12 reps Internal Rotation PROM;10 reps;AROM;12 reps Flexion PROM;10 reps;AROM;12 reps ABduction PROM;10 reps;AROM;12 reps Shoulder Exercises: Seated Elevation AROM;15 reps Extension AROM;15 reps Retraction AROM;15 reps Row AROM;15 reps Protraction AROM;10 reps Horizontal ABduction AROM;10 reps External Rotation AROM;10 reps Internal Rotation AROM;10 reps Flexion AROM;10 reps Abduction AROM;10 reps Shoulder Exercises: Therapy Ball Flexion 20 reps ABduction 20 reps Right/Left 5 reps Shoulder Exercises: ROM/Strengthening Wall Wash 2'  Thumb Tacks 1' X to V Arms x 5 Prot/Ret//Elev/Dep 1'       Manual Therapy Manual Therapy: Myofascial release Myofascial Release: MFR and manual stretching to right upper arm, shoulder, and scapular region to decrease pain and increase mobility.  Scar release to each incision [id number]  Occupational Therapy Assessment and Plan OT Assessment and Plan Clinical Impression Statement: A:  Able to complete AROM in supine without assistance this date.  Required min faciliation with abd in seated. OT Plan: P:  Increase to 10 reps of x to v arms.   Goals Short Term Goals Time to Complete Short Term Goals: 4 weeks Short Term Goal 1:  Patient will be educated on a HEP. Short Term Goal 2: Patient will increase PROM in right shoulder to [facility] for increased ability to swing golf club. Short Term Goal 3: Patient will increase right shoulder strength to 3+/5 for increased ability to lift gardening tools. Short Term Goal 4: Patient will decrease pain to 2/10 when reaching overhead. Short Term Goal 5: Patient will decrease fascial restrictions to minimal in his right shoulder region. Long Term Goals Time to Complete Long Term Goals: 8 weeks Long Term Goal 1: Patient will return to prior level of I with all B/IADLs, work, and leisure activities. Long Term Goal 2: Patient will increase right shoulder AROM to WNL for increased ability to swing golf club. Long Term Goal 3: Patient will have 5/5 strength in his right shoulder for increased ability to lift gardening tools. Long Term Goal 4: Patient will have 1/10 pain in his right shoulder when hitting golf balls. Long Term Goal 5: Patient will have trace fascial restrictions in his right shoulder region.  Problem List Patient Active Problem List  Diagnoses  . Impingement syndrome of shoulder region  . Pain in joint, shoulder region  . Muscle weakness (generalized)    End of Session Activity Tolerance: Patient tolerated treatment well General Behavior During Session: [facility] for tasks performed Cognition: [facility] for tasks performed  GO No functional reporting required  Shirlean Mylar, OTR/L  05/22/2011, 4:09 PM

## 2011-05-25 ENCOUNTER — Ambulatory Visit (HOSPITAL_COMMUNITY)
Admission: RE | Admit: 2011-05-25 | Discharge: 2011-05-25 | Disposition: A | Discharge status: Home / Self Care | Payer: Medicare Other | Source: Ambulatory Visit | Attending: Occupational Therapy | Admitting: Occupational Therapy

## 2011-05-25 DIAGNOSIS — M7540 Impingement syndrome of unspecified shoulder: Secondary | ICD-10-CM

## 2011-05-25 DIAGNOSIS — M6281 Muscle weakness (generalized): Secondary | ICD-10-CM

## 2011-05-25 DIAGNOSIS — M25519 Pain in unspecified shoulder: Secondary | ICD-10-CM

## 2011-05-25 NOTE — Progress Notes (Signed)
Occupational Therapy Treatment  Patient Details  Name: ROALD LUKACS MRN: [id number] Date of Birth: 1945-12-25  Today's Date: 05/25/2011 Time: 7829-5621 Time Calculation (min): 44 min Manual Therapy [phone] 29' Therapeutic Exercise [id number] 14'  Visit#: 5  of 25   Re-eval: 05/13/11 Assessment Diagnosis: Right SAD/DCE Surgical Date: 05/05/11  Subjective Symptoms/Limitations Symptoms: S:  It hurts me more at night. Pain Assessment Currently in Pain?: Yes Pain Score:   3 Pain Location: Shoulder Pain Orientation: Right Pain Type: Acute pain  Precautions/Restrictions     Exercise/Treatments Supine Protraction: PROM;10 reps;AROM;15 reps Horizontal ABduction: PROM;10 reps;AROM;15 reps External Rotation: PROM;10 reps;AROM;15 reps Internal Rotation: PROM;10 reps;AROM;15 reps Flexion: PROM;10 reps;AROM;15 reps ABduction: PROM;10 reps;AROM;15 reps Seated Protraction: AROM;12 reps Horizontal ABduction: AROM;12 reps External Rotation: AROM;12 reps Internal Rotation: AROM;12 reps Flexion: AROM;12 reps Abduction: AROM;12 reps Therapy Ball Flexion: 20 reps ABduction: 20 reps Right/Left: 5 reps ROM / Strengthening / Isometric Strengthening Wall Wash: 3' Thumb Tacks: 1' X to V Arms: x10       Manual Therapy Manual Therapy: Myofascial release Myofascial Release: MFR and manual stretching to right upper arm, shoulder, and scapular region to decrease pain and increase mobility. Scar release to each incision   Occupational Therapy Assessment and Plan OT Assessment and Plan Clinical Impression Statement: A:  Min to no assist with abd in seated position.  Added tband for scapular stab ex. Rehab Potential: Excellent OT Plan: P:  Increase reps with tband.   Goals Short Term Goals Time to Complete Short Term Goals: 4 weeks Short Term Goal 1: Patient will be educated on a HEP. Short Term Goal 2: Patient will increase PROM in right shoulder to [facility] for increased ability to  swing golf club. Short Term Goal 3: Patient will increase right shoulder strength to 3+/5 for increased ability to lift gardening tools. Short Term Goal 4: Patient will decrease pain to 2/10 when reaching overhead. Short Term Goal 5: Patient will decrease fascial restrictions to minimal in his right shoulder region. Long Term Goals Time to Complete Long Term Goals: 8 weeks Long Term Goal 1: Patient will return to prior level of I with all B/IADLs, work, and leisure activities. Long Term Goal 2: Patient will increase right shoulder AROM to WNL for increased ability to swing golf club. Long Term Goal 3: Patient will have 5/5 strength in his right shoulder for increased ability to lift gardening tools. Long Term Goal 4: Patient will have 1/10 pain in his right shoulder when hitting golf balls. Long Term Goal 5: Patient will have trace fascial restrictions in his right shoulder region.  Problem List Patient Active Problem List  Diagnoses  . Impingement syndrome of shoulder region  . Pain in joint, shoulder region  . Muscle weakness (generalized)    End of Session Activity Tolerance: Patient tolerated treatment well General Behavior During Session: [facility] for tasks performed Cognition: [facility] for tasks performed  GO No functional reporting required   Deantre Bourdon L. Khadeja Abt, COTA/L  05/25/2011, 12:08 PM

## 2011-05-26 ENCOUNTER — Ambulatory Visit (HOSPITAL_COMMUNITY)
Admission: RE | Admit: 2011-05-26 | Discharge: 2011-05-26 | Disposition: A | Discharge status: Home / Self Care | Payer: Medicare Other | Source: Ambulatory Visit | Attending: Family Medicine | Admitting: Family Medicine

## 2011-05-26 DIAGNOSIS — M7540 Impingement syndrome of unspecified shoulder: Secondary | ICD-10-CM

## 2011-05-26 DIAGNOSIS — M6281 Muscle weakness (generalized): Secondary | ICD-10-CM

## 2011-05-26 DIAGNOSIS — M25519 Pain in unspecified shoulder: Secondary | ICD-10-CM

## 2011-05-26 NOTE — Progress Notes (Addendum)
Occupational Therapy Treatment  Patient Details  Name: [name] MRN: [id number] Date of Birth: 02/17/1945  Today's Date: 05/26/2011 Time: 4098-1191 Time Calculation (min): 53 min Manual Therapy [phone] 27' Therapeutic Exercise [phone] 25'  Visit#: 6  of 25   Re-eval: 06/12/11 Assessment Diagnosis: Right SAD/DCE Surgical Date: 05/05/11  Subjective Symptoms/Limitations Symptoms: S:  I think I slept on it last night again.  Precautions/Restrictions     Exercise/Treatments Supine Protraction: PROM;10 reps;AROM;15 reps Horizontal ABduction: PROM;10 reps;AROM;15 reps External Rotation: PROM;10 reps;AROM;15 reps Internal Rotation: PROM;10 reps;AROM;15 reps Flexion: PROM;10 reps;AROM;15 reps ABduction: PROM;10 reps;AROM;15 reps Seated Theraband Level (Shoulder Elevation): Level 2 (Red) Extension: Theraband;12 reps Theraband Level (Shoulder Extension): Level 2 (Red) Retraction: Theraband;12 reps Theraband Level (Shoulder Retraction): Level 2 (Red) Row: Theraband;12 reps Theraband Level (Shoulder Row): Level 2 (Red) Protraction: AROM;15 reps Horizontal ABduction: AROM;15 reps External Rotation: AROM;15 reps Internal Rotation: AROM;15 reps Flexion: AROM;15 reps Abduction: AROM;15 reps Therapy Ball Flexion: 20 reps ABduction: 20 reps Right/Left: 5 reps ROM / Strengthening / Isometric Strengthening UBE (Upper Arm Bike): 3' forward 3' backwards 1.0 Wall Wash: 3' Thumb Tacks: 1' X to V Arms: x10 Prot/Ret//Elev/Dep: 1'       Manual Therapy Manual Therapy: Myofascial release Myofascial Release: MFR and manual stretching to right upper arm, shoulder, and scapular region to decrease pain and increase mobility. Scar release to each incision   Occupational Therapy Assessment and Plan OT Assessment and Plan Clinical Impression Statement: A:  Added UBE.  Good release along ribs with MFR evidenced by increase in ABD PROM and decrease pain with ABD PROM Rehab  Potential: Excellent OT Plan: P:  Add w arms.   Goals Short Term Goals Time to Complete Short Term Goals: 4 weeks Short Term Goal 1: Patient will be educated on a HEP. Short Term Goal 2: Patient will increase PROM in right shoulder to [facility] for increased ability to swing golf club. Short Term Goal 3: Patient will increase right shoulder strength to 3+/5 for increased ability to lift gardening tools. Short Term Goal 4: Patient will decrease pain to 2/10 when reaching overhead. Short Term Goal 5: Patient will decrease fascial restrictions to minimal in his right shoulder region. Long Term Goals Time to Complete Long Term Goals: 8 weeks Long Term Goal 1: Patient will return to prior level of I with all B/IADLs, work, and leisure activities. Long Term Goal 2: Patient will increase right shoulder AROM to WNL for increased ability to swing golf club. Long Term Goal 3: Patient will have 5/5 strength in his right shoulder for increased ability to lift gardening tools. Long Term Goal 4: Patient will have 1/10 pain in his right shoulder when hitting golf balls. Long Term Goal 5: Patient will have trace fascial restrictions in his right shoulder region.  Problem List Patient Active Problem List  Diagnoses  . Impingement syndrome of shoulder region  . Pain in joint, shoulder region  . Muscle weakness (generalized)    End of Session Activity Tolerance: Patient tolerated treatment well General Behavior During Session: [facility] for tasks performed Cognition: [facility] for tasks performed  GO No functional reporting required   Dajanae Brophy L. Carrington Mullenax, COTA/L  05/26/2011, 9:46 AM

## 2011-05-28 ENCOUNTER — Ambulatory Visit (HOSPITAL_COMMUNITY)
Admission: RE | Admit: 2011-05-28 | Discharge: 2011-05-28 | Disposition: A | Discharge status: Home / Self Care | Payer: Medicare Other | Source: Ambulatory Visit | Attending: Orthopedic Surgery | Admitting: Orthopedic Surgery

## 2011-05-28 DIAGNOSIS — M25519 Pain in unspecified shoulder: Secondary | ICD-10-CM

## 2011-05-28 DIAGNOSIS — M6281 Muscle weakness (generalized): Secondary | ICD-10-CM

## 2011-05-28 DIAGNOSIS — M7540 Impingement syndrome of unspecified shoulder: Secondary | ICD-10-CM

## 2011-05-28 NOTE — Progress Notes (Signed)
Occupational Therapy Treatment  Patient Details  Name: [name] MRN: [id number] Date of Birth: 04/11/1945  Today's Date: 05/28/2011 Time: 3664-4034 Time Calculation (min): 60 min Manual Therapy [phone] 29' Therapeutic exercises 919-950 31' Visit#: 7  of 24   Re-eval: 06/12/11    Subjective Symptoms/Limitations Symptoms: S:  I still have alot of soreness in the posterior shoulder. Pain Assessment Currently in Pain?: Yes Pain Score:   2 Pain Location: Shoulder Pain Orientation: Right Pain Type: Acute pain  Precautions/Restrictions     O:  Exercise/Treatments Supine Protraction: PROM;10 reps;AROM;15 reps Horizontal ABduction: PROM;10 reps;AROM;15 reps External Rotation: PROM;10 reps;AROM;15 reps Internal Rotation: PROM;10 reps;AROM;15 reps Flexion: PROM;10 reps;AROM;15 reps ABduction: PROM;10 reps;AROM;15 reps Seated Extension: Theraband;15 reps (green) Retraction: Theraband;15 reps (green) Row: Theraband;15 reps (green) Protraction: AROM;15 reps Horizontal ABduction: AROM;15 reps External Rotation: AROM;15 reps Internal Rotation: AROM;15 reps Flexion: AROM;15 reps Abduction: AROM;15 reps Other Seated Exercises: ext and int rot green tband x 15 Therapy Ball Flexion: 20 reps ABduction: 20 reps Right/Left: 5 reps ROM / Strengthening / Isometric Strengthening UBE (Upper Arm Bike): 3' forward 3' backwards 1.0 Wall Wash: 4'  Thumb Tacks: 1' "W" Arms: x 12 X to V Arms: x 12 Prot/Ret//Elev/Dep: 1'      Manual Therapy Manual Therapy: Myofascial release Myofascial Release: MFR and manual stretching to right upper arm, shoulder, and scapular region to decrease pain and increase mobility.  Scar release to each incision.  [phone]  Occupational Therapy Assessment and Plan OT Assessment and Plan Clinical Impression Statement: A:  With MFR to posterior/lateral upper arm and scapular, increased passive abduction with less pain. OT Plan: P:  Increase PROM to [facility]  in supine and add 1# resistance in supine.   Goals Short Term Goals Time to Complete Short Term Goals: 4 weeks Short Term Goal 1: Patient will be educated on a HEP. Short Term Goal 2: Patient will increase PROM in right shoulder to [facility] for increased ability to swing golf club. Short Term Goal 3: Patient will increase right shoulder strength to 3+/5 for increased ability to lift gardening tools. Short Term Goal 4: Patient will decrease pain to 2/10 when reaching overhead. Short Term Goal 5: Patient will decrease fascial restrictions to minimal in his right shoulder region. Long Term Goals Time to Complete Long Term Goals: 8 weeks Long Term Goal 1: Patient will return to prior level of I with all B/IADLs, work, and leisure activities. Long Term Goal 2: Patient will increase right shoulder AROM to WNL for increased ability to swing golf club. Long Term Goal 3: Patient will have 5/5 strength in his right shoulder for increased ability to lift gardening tools. Long Term Goal 4: Patient will have 1/10 pain in his right shoulder when hitting golf balls. Long Term Goal 5: Patient will have trace fascial restrictions in his right shoulder region.  Problem List Patient Active Problem List  Diagnoses  . Impingement syndrome of shoulder region  . Pain in joint, shoulder region  . Muscle weakness (generalized)    End of Session Activity Tolerance: Patient tolerated treatment well General Behavior During Session: [facility] for tasks performed Cognition: [facility] for tasks performed  GO No functional reporting required  Shirlean Mylar, OTR/L  05/28/2011, 11:58 AM

## 2011-06-01 ENCOUNTER — Ambulatory Visit (HOSPITAL_COMMUNITY)
Admission: RE | Admit: 2011-06-01 | Discharge: 2011-06-01 | Disposition: A | Discharge status: Home / Self Care | Payer: Medicare Other | Source: Ambulatory Visit | Attending: Orthopedic Surgery | Admitting: Orthopedic Surgery

## 2011-06-01 DIAGNOSIS — M6281 Muscle weakness (generalized): Secondary | ICD-10-CM

## 2011-06-01 DIAGNOSIS — M7540 Impingement syndrome of unspecified shoulder: Secondary | ICD-10-CM

## 2011-06-01 DIAGNOSIS — M25519 Pain in unspecified shoulder: Secondary | ICD-10-CM

## 2011-06-01 NOTE — Progress Notes (Signed)
Occupational Therapy Treatment  Patient Details  Name: [name] MRN: [id number] Date of Birth: [birth year]  Today's Date: 06/01/2011 Time: 6962-9528 Time Calculation (min): 65 min Manual Therapy [phone] 24' Therapeutic Exercise 909-950 25' Visit#: 8  of 24   Re-eval: 06/12/11    Subjective Symptoms/Limitations Symptoms: S:  I played golf this weekend.  I just chipped and putted, but it felt good. Pain Assessment Currently in Pain?: Yes Pain Score:   2 Pain Orientation: Right Pain Type: Acute pain  Precautions/Restrictions     Exercise/Treatments Supine Protraction: PROM;Strengthening;10 reps Protraction Weight (lbs): 1 Horizontal ABduction: PROM;Strengthening;10 reps Horizontal ABduction Weight (lbs): 1 External Rotation: PROM;Strengthening;10 reps External Rotation Weight (lbs): 1 Internal Rotation: PROM;Strengthening;10 reps Internal Rotation Weight (lbs): 1 Flexion: PROM;Strengthening;10 reps Shoulder Flexion Weight (lbs): 1 ABduction: PROM;Strengthening;10 reps Shoulder ABduction Weight (lbs): 1 Seated Extension: Theraband;15 reps Theraband Level (Shoulder Extension): Level 3 (Green) Retraction: Theraband;15 reps Theraband Level (Shoulder Retraction): Level 3 (Green) Row: Theraband;15 reps Theraband Level (Shoulder Row): Level 3 (Green) Protraction: AROM;15 reps Horizontal ABduction: AROM;15 reps External Rotation: AROM;15 reps Internal Rotation: AROM;15 reps Flexion: AROM;15 reps Abduction: AROM;15 reps Other Seated Exercises: ext and int rot green tband x 15 Therapy Ball Flexion: 20 reps ABduction: 20 reps Right/Left: 5 reps ROM / Strengthening / Isometric Strengthening UBE (Upper Arm Bike): 3' forward 3' backwards 1.5 Wall Wash: 2' with 1# Thumb Tacks: 1' "W" Arms: x 15 X to V Arms: x15 Prot/Ret//Elev/Dep: 1'      Manual Therapy Manual Therapy: Myofascial release Myofascial Release: MFR and manual stretching to right upper arm,  shoulder, and scapular region to decrease pain and increase mobility.  Scar release to each incision.  [phone]  Occupational Therapy Assessment and Plan OT Assessment and Plan Clinical Impression Statement: A:  Added one pound to supine exercises and wall wash. OT Plan: P:  Attempt weight in seated as tolerated.   Goals Short Term Goals Time to Complete Short Term Goals: 4 weeks Short Term Goal 1: Patient will be educated on a HEP. Short Term Goal 1 Progress: Progressing toward goal Short Term Goal 2: Patient will increase PROM in right shoulder to [facility] for increased ability to swing golf club. Short Term Goal 2 Progress: Progressing toward goal Short Term Goal 3: Patient will increase right shoulder strength to 3+/5 for increased ability to lift gardening tools. Short Term Goal 3 Progress: Progressing toward goal Short Term Goal 4: Patient will decrease pain to 2/10 when reaching overhead. Short Term Goal 4 Progress: Progressing toward goal Short Term Goal 5: Patient will decrease fascial restrictions to minimal in his right shoulder region. Short Term Goal 5 Progress: Progressing toward goal Long Term Goals Time to Complete Long Term Goals: 8 weeks Long Term Goal 1: Patient will return to prior level of I with all B/IADLs, work, and leisure activities. Long Term Goal 1 Progress: Progressing toward goal Long Term Goal 2: Patient will increase right shoulder AROM to WNL for increased ability to swing golf club. Long Term Goal 2 Progress: Progressing toward goal Long Term Goal 3: Patient will have 5/5 strength in his right shoulder for increased ability to lift gardening tools. Long Term Goal 3 Progress: Progressing toward goal Long Term Goal 4: Patient will have 1/10 pain in his right shoulder when hitting golf balls. Long Term Goal 4 Progress: Progressing toward goal Long Term Goal 5: Patient will have trace fascial restrictions in his right shoulder region. Long Term Goal 5 Progress:  Progressing toward  goal  Problem List Patient Active Problem List  Diagnoses  . Impingement syndrome of shoulder region  . Pain in joint, shoulder region  . Muscle weakness (generalized)    End of Session Activity Tolerance: Patient tolerated treatment well General Behavior During Session: [facility] for tasks performed Cognition: [facility] for tasks performed  GO No functional reporting required  Shirlean Mylar, OTR/L  06/01/2011, 11:48 AM

## 2011-06-03 ENCOUNTER — Ambulatory Visit (HOSPITAL_COMMUNITY)
Admission: RE | Admit: 2011-06-03 | Discharge: 2011-06-03 | Disposition: A | Discharge status: Home / Self Care | Payer: Medicare Other | Source: Ambulatory Visit | Attending: Orthopedic Surgery | Admitting: Orthopedic Surgery

## 2011-06-03 DIAGNOSIS — M6281 Muscle weakness (generalized): Secondary | ICD-10-CM

## 2011-06-03 DIAGNOSIS — M7540 Impingement syndrome of unspecified shoulder: Secondary | ICD-10-CM

## 2011-06-03 DIAGNOSIS — M25519 Pain in unspecified shoulder: Secondary | ICD-10-CM

## 2011-06-03 NOTE — Progress Notes (Signed)
Occupational Therapy Treatment  Patient Details  Name: [name] MRN: [id number] Date of Birth: 18-Jul-1945  Today's Date: 06/03/2011 Time: 4098-1191 Time Calculation (min): 64 min Manual THerapy [phone] 25' Therapeutic Exercises 911-950 39' Visit#: 9  of 24   Re-eval: 06/12/11    Subjective Symptoms/Limitations Symptoms: S:  Its doing better, not as sore and I can roll over on it better with less pain. Pain Assessment Currently in Pain?: No/denies  Precautions/Restrictions     Exercise/Treatments Supine   Seated   Therapy Ball   ROM / Strengthening / Isometric Strengthening        Manual Therapy Manual Therapy: Myofascial release Myofascial Release: MFR and manual stretching to right upper arm, shoulder, and scapular region to decrease pain and increase mobility.  Scar release to each incision.  [phone]  Occupational Therapy Assessment and Plan OT Assessment and Plan Clinical Impression Statement: A:  Increased to full PROM and AROM in supine this date.  Added 1# to seated strengthening exercises. OT Plan: P:  DC flexion and abduction with ball.  Attempt cybex press and row. Give tband as HEP.   Goals Short Term Goals Time to Complete Short Term Goals: 4 weeks Short Term Goal 1: Patient will be educated on a HEP. Short Term Goal 2: Patient will increase PROM in right shoulder to [facility] for increased ability to swing golf club. Short Term Goal 3: Patient will increase right shoulder strength to 3+/5 for increased ability to lift gardening tools. Short Term Goal 4: Patient will decrease pain to 2/10 when reaching overhead. Short Term Goal 5: Patient will decrease fascial restrictions to minimal in his right shoulder region. Long Term Goals Time to Complete Long Term Goals: 8 weeks Long Term Goal 1: Patient will return to prior level of I with all B/IADLs, work, and leisure activities. Long Term Goal 2: Patient will increase right shoulder AROM to WNL for  increased ability to swing golf club. Long Term Goal 3: Patient will have 5/5 strength in his right shoulder for increased ability to lift gardening tools. Long Term Goal 4: Patient will have 1/10 pain in his right shoulder when hitting golf balls. Long Term Goal 5: Patient will have trace fascial restrictions in his right shoulder region.  Problem List Patient Active Problem List  Diagnoses  . Impingement syndrome of shoulder region  . Pain in joint, shoulder region  . Muscle weakness (generalized)    End of Session Activity Tolerance: Patient tolerated treatment well General Behavior During Session: [facility] for tasks performed Cognition: [facility] for tasks performed  GO No functional reporting required  Shirlean Mylar, OTR/L  06/03/2011, 11:12 AM

## 2011-06-05 ENCOUNTER — Ambulatory Visit (HOSPITAL_COMMUNITY)
Admission: RE | Admit: 2011-06-05 | Discharge: 2011-06-05 | Disposition: A | Discharge status: Home / Self Care | Payer: Medicare Other | Source: Ambulatory Visit | Attending: Family Medicine | Admitting: Family Medicine

## 2011-06-05 DIAGNOSIS — M25519 Pain in unspecified shoulder: Secondary | ICD-10-CM

## 2011-06-05 DIAGNOSIS — M7540 Impingement syndrome of unspecified shoulder: Secondary | ICD-10-CM

## 2011-06-05 DIAGNOSIS — M6281 Muscle weakness (generalized): Secondary | ICD-10-CM

## 2011-06-05 NOTE — Progress Notes (Signed)
Occupational Therapy Treatment  Patient Details  Name: [name] MRN: [id number] Date of Birth: 02-17-1945  Today's Date: 06/05/2011 Time: 5188-4166 Time Calculation (min): 49 min  Visit#: 10  of 24   Re-eval: 06/12/11 Manual Therapy [phone] 23' Therapeutic Exercise [phone] 25'    Subjective Symptoms/Limitations Symptoms: S:  It's not bad. I can sleep on that side some now Pain Assessment Currently in Pain?: Yes Pain Score:   2 Pain Location: Shoulder Pain Orientation: Right  Precautions/Restrictions     Exercise/Treatments Supine Protraction: PROM;Strengthening;10 reps Protraction Weight (lbs): 2# Horizontal ABduction: PROM;Strengthening;10 reps Horizontal ABduction Weight (lbs): 2# External Rotation: PROM;Strengthening;10 reps External Rotation Weight (lbs): 2# Internal Rotation: PROM;Strengthening;10 reps Internal Rotation Weight (lbs): 2# Flexion: PROM;Strengthening;10 reps Shoulder Flexion Weight (lbs): 2# ABduction: PROM;Strengthening;10 reps Shoulder ABduction Weight (lbs): 2# Seated Extension: Theraband;15 reps Theraband Level (Shoulder Extension): Level 3 (Green) Retraction: Theraband;15 reps Theraband Level (Shoulder Retraction): Level 3 (Green) Row: Theraband;15 reps Theraband Level (Shoulder Row): Level 3 (Green) Protraction: Strengthening;15 reps Protraction Weight (lbs): 1 Horizontal ABduction: Strengthening;10 reps Horizontal ABduction Weight (lbs): 1 External Rotation: Strengthening;10 reps;Theraband;15 reps Theraband Level (Shoulder External Rotation): Level 3 (Green) External Rotation Weight (lbs): 1 Internal Rotation: Strengthening;10 reps;Theraband;15 reps Theraband Level (Shoulder Internal Rotation): Level 3 (Green) Internal Rotation Weight (lbs): 1 Flexion: Strengthening;10 reps Flexion Weight (lbs): 1 Abduction: Strengthening;10 reps ABduction Weight (lbs): 1 Therapy Ball Flexion:  (d/c) ABduction:  (d/c) Right/Left: 5  reps ROM / Strengthening / Isometric Strengthening UBE (Upper Arm Bike): 3' forward 3' backwards 2.0 Wall Wash: time Thumb Tacks: time "W" Arms: 10 x with 1# X to V Arms: 10x with 1# Prot/Ret//Elev/Dep: 1'      Manual Therapy Manual Therapy: Myofascial release Myofascial Release: MFR and manual stretching to right upper arm, shoulder, and scapular region to decrease pain and increase mobility. Scar release to each incision.  Occupational Therapy Assessment and Plan OT Assessment and Plan Clinical Impression Statement: A:  Increased supine to 2# and added cybex press and row. Rehab Potential: Excellent OT Plan: P: Increase cybex to 2 plates.   Goals Short Term Goals Time to Complete Short Term Goals: 4 weeks Short Term Goal 1: Patient will be educated on a HEP. Short Term Goal 2: Patient will increase PROM in right shoulder to [facility] for increased ability to swing golf club. Short Term Goal 3: Patient will increase right shoulder strength to 3+/5 for increased ability to lift gardening tools. Short Term Goal 4: Patient will decrease pain to 2/10 when reaching overhead. Short Term Goal 5: Patient will decrease fascial restrictions to minimal in his right shoulder region. Long Term Goals Time to Complete Long Term Goals: 8 weeks Long Term Goal 1: Patient will return to prior level of I with all B/IADLs, work, and leisure activities. Long Term Goal 2: Patient will increase right shoulder AROM to WNL for increased ability to swing golf club. Long Term Goal 3: Patient will have 5/5 strength in his right shoulder for increased ability to lift gardening tools. Long Term Goal 4: Patient will have 1/10 pain in his right shoulder when hitting golf balls. Long Term Goal 5: Patient will have trace fascial restrictions in his right shoulder region.  Problem List Patient Active Problem List  Diagnoses  . Impingement syndrome of shoulder region  . Pain in joint, shoulder region  . Muscle  weakness (generalized)    End of Session Activity Tolerance: Patient tolerated treatment well General Behavior During Session: [facility] for tasks performed  Cognition: WFL for tasks performed OT Plan of Care OT Home Exercise Plan: TBand with handouts. Consulted and Agree with Plan of Care: Patient  GO No functional reporting required  Naira Standiford L. Cherene Dobbins, COTA/L  06/05/2011, 11:10 AM

## 2011-06-08 ENCOUNTER — Ambulatory Visit (HOSPITAL_COMMUNITY)
Admission: RE | Admit: 2011-06-08 | Discharge: 2011-06-08 | Disposition: A | Discharge status: Home / Self Care | Payer: Medicare Other | Source: Ambulatory Visit | Attending: Orthopedic Surgery | Admitting: Orthopedic Surgery

## 2011-06-08 DIAGNOSIS — M7540 Impingement syndrome of unspecified shoulder: Secondary | ICD-10-CM

## 2011-06-08 DIAGNOSIS — M6281 Muscle weakness (generalized): Secondary | ICD-10-CM

## 2011-06-08 DIAGNOSIS — M25519 Pain in unspecified shoulder: Secondary | ICD-10-CM

## 2011-06-08 NOTE — Progress Notes (Signed)
Occupational Therapy Treatment  Patient Details  Name: [name] MRN: [id number] Date of Birth: 1945/12/15  Today's Date: 06/08/2011 Time: 7829-5621 Time Calculation (min): 49 min Manual THerapy [phone] 22' Therapeutic Exercises [phone] 27'  Visit#: 11  of 24   Re-eval: 06/12/11    Subjective Symptoms/Limitations Symptoms: S:  Im doing ok. Pain Assessment Currently in Pain?: Yes Pain Score:   1 Pain Location: Shoulder Pain Orientation: Right Pain Type: Chronic pain;Acute pain  Precautions/Restrictions     Exercise/Treatments Supine Protraction: PROM;10 reps;Strengthening;12 reps Protraction Weight (lbs): 2# Horizontal ABduction: PROM;10 reps;Strengthening;12 reps Horizontal ABduction Weight (lbs): 2# External Rotation: PROM;10 reps;Strengthening;12 reps External Rotation Weight (lbs): 2# Internal Rotation: PROM;10 reps;Strengthening;12 reps Internal Rotation Weight (lbs): 2# Flexion: PROM;10 reps;Strengthening;12 reps Shoulder Flexion Weight (lbs): 2# ABduction: PROM;10 reps;Strengthening;12 reps Shoulder ABduction Weight (lbs): 2# Seated Protraction: Strengthening;10 reps Protraction Weight (lbs): 2 Horizontal ABduction: Strengthening;10 reps Horizontal ABduction Weight (lbs): 2 External Rotation: Strengthening;10 reps External Rotation Weight (lbs): 2 Internal Rotation: Strengthening;10 reps Internal Rotation Weight (lbs): 2 Flexion: Strengthening;10 reps Flexion Weight (lbs): 2 Abduction: Strengthening;10 reps ABduction Weight (lbs): 2 Therapy Ball Right/Left: 5 reps ROM / Strengthening / Isometric Strengthening UBE (Upper Arm Bike): 3' forward 3' backwards 2.0 Cybex Press: 15 reps;1.5 plate Cybex Row: 1.5 plate;15 reps Wall Wash: 2' wtih 2# Thumb Tacks: dc "W" Arms: 10 x with 2# X to V Arms: 10 x with 2# Proximal Shoulder Strengthening, Seated: 2# x10 Prot/Ret//Elev/Dep: dc      Manual Therapy Manual Therapy: Myofascial  release Myofascial Release: MFR and manual stretching to right upper arm, shoulder, and scapular region to decrease pain and increase mobility.  Scar release to each incision.  [phone]  Occupational Therapy Assessment and Plan OT Assessment and Plan Clinical Impression Statement: A:  Added proximal shoulder strengthening with 2#.  Transitioned to ER and IR strengthening in 90 abd as patient is having difficulty reaching his back pocket and this position will help him attain this goal. OT Plan: P:  Increase cybex to 2 plates.   Goals Short Term Goals Time to Complete Short Term Goals: 4 weeks Short Term Goal 1: Patient will be educated on a HEP. Short Term Goal 2: Patient will increase PROM in right shoulder to [facility] for increased ability to swing golf club. Short Term Goal 3: Patient will increase right shoulder strength to 3+/5 for increased ability to lift gardening tools. Short Term Goal 4: Patient will decrease pain to 2/10 when reaching overhead. Short Term Goal 5: Patient will decrease fascial restrictions to minimal in his right shoulder region. Long Term Goals Time to Complete Long Term Goals: 8 weeks Long Term Goal 1: Patient will return to prior level of I with all B/IADLs, work, and leisure activities. Long Term Goal 2: Patient will increase right shoulder AROM to WNL for increased ability to swing golf club. Long Term Goal 3: Patient will have 5/5 strength in his right shoulder for increased ability to lift gardening tools. Long Term Goal 4: Patient will have 1/10 pain in his right shoulder when hitting golf balls. Long Term Goal 5: Patient will have trace fascial restrictions in his right shoulder region.  Problem List Patient Active Problem List  Diagnoses  . Impingement syndrome of shoulder region  . Pain in joint, shoulder region  . Muscle weakness (generalized)    End of Session Activity Tolerance: Patient tolerated treatment well General Behavior During Session: [facility]  for tasks performed Cognition: [facility] for tasks performed  GO  No functional reporting required  Shirlean Mylar, OTR/L  06/08/2011, 9:40 AM

## 2011-06-10 ENCOUNTER — Ambulatory Visit (HOSPITAL_COMMUNITY)
Admission: RE | Admit: 2011-06-10 | Discharge: 2011-06-10 | Disposition: A | Discharge status: Home / Self Care | Payer: Medicare Other | Source: Ambulatory Visit | Attending: Family Medicine | Admitting: Family Medicine

## 2011-06-10 DIAGNOSIS — M25519 Pain in unspecified shoulder: Secondary | ICD-10-CM | POA: Insufficient documentation

## 2011-06-10 DIAGNOSIS — M6281 Muscle weakness (generalized): Secondary | ICD-10-CM

## 2011-06-10 DIAGNOSIS — IMO0001 Reserved for inherently not codable concepts without codable children: Secondary | ICD-10-CM | POA: Insufficient documentation

## 2011-06-10 DIAGNOSIS — M25619 Stiffness of unspecified shoulder, not elsewhere classified: Secondary | ICD-10-CM | POA: Insufficient documentation

## 2011-06-10 DIAGNOSIS — M7540 Impingement syndrome of unspecified shoulder: Secondary | ICD-10-CM

## 2011-06-10 NOTE — Progress Notes (Signed)
Occupational Therapy Treatment Patient Details  Name: [name] MRN: [id number] Date of Birth: 12/10/1945  Today's Date: 06/10/2011 Time: 4098-1191 OT Time Calculation (min): 35 min Manual Therapy [phone] 24' Therapeutic Exercises [phone] 10' Visit#: 12  of 24   Re-eval: 07/08/11     Subjective Symptoms/Limitations Symptoms: S:  Im ok. Pain Assessment Currently in Pain?: Yes Pain Score:   1 Pain Location: Shoulder Pain Orientation: Right Pain Type: Acute pain  Precautions/Restrictions   n/a  Exercise/Treatments Supine Protraction: PROM;10 reps Horizontal ABduction: PROM;10 reps External Rotation: PROM;10 reps Internal Rotation: PROM;10 reps Flexion: PROM;10 reps ABduction: PROM;10 reps      ROM / Strengthening / Isometric Strengthening UBE (Upper Arm Bike): 3' forward 3' backwards 2.0      Manual Therapy Manual Therapy: Myofascial release Myofascial Release: MFR and manual stretching to right upper arm, shoulder, and scapular region to decrease pain and increase mobility.  Scar release to each incision.  [phone]  Occupational Therapy Assessment and Plan OT Assessment and Plan Clinical Impression Statement: A:  Please see monthly progress note. OT Frequency: Min 3X/week OT Duration: 4 weeks OT Plan: P:  Resume therapeutic exercises, administer UEFI.   Goals Short Term Goals Time to Complete Short Term Goals: 4 weeks Short Term Goal 1: Patient will be educated on a HEP. Short Term Goal 1 Progress: Met Short Term Goal 2: Patient will increase PROM in right shoulder to [facility] for increased ability to swing golf club. Short Term Goal 2 Progress: Met Short Term Goal 3: Patient will increase right shoulder strength to 3+/5 for increased ability to lift gardening tools. Short Term Goal 3 Progress: Met Short Term Goal 4: Patient will decrease pain to 2/10 when reaching overhead. Short Term Goal 4 Progress: Met Short Term Goal 5: Patient will decrease fascial  restrictions to minimal in his right shoulder region. Short Term Goal 5 Progress: Met Long Term Goals Time to Complete Long Term Goals: 8 weeks Long Term Goal 1: Patient will return to prior level of I with all B/IADLs, work, and leisure activities. Long Term Goal 1 Progress: Progressing toward goal Long Term Goal 2: Patient will increase right shoulder AROM to WNL for increased ability to swing golf club. Long Term Goal 2 Progress: Progressing toward goal Long Term Goal 3: Patient will have 5/5 strength in his right shoulder for increased ability to lift gardening tools. Long Term Goal 3 Progress: Progressing toward goal Long Term Goal 4: Patient will have 1/10 pain in his right shoulder when hitting golf balls. Long Term Goal 4 Progress: Progressing toward goal Long Term Goal 5: Patient will have trace fascial restrictions in his right shoulder region. Long Term Goal 5 Progress: Progressing toward goal  Problem List Patient Active Problem List  Diagnoses  . Impingement syndrome of shoulder region  . Pain in joint, shoulder region  . Muscle weakness (generalized)    End of Session Activity Tolerance: Patient tolerated treatment well General Behavior During Session: [facility] for tasks performed Cognition: [facility] for tasks performed  GO No functional reporting required  Shirlean Mylar, OTR/L  06/10/2011, 9:37 AM

## 2011-06-12 ENCOUNTER — Ambulatory Visit (HOSPITAL_COMMUNITY)
Admission: RE | Admit: 2011-06-12 | Discharge: 2011-06-12 | Disposition: A | Discharge status: Home / Self Care | Payer: Medicare Other | Source: Ambulatory Visit | Attending: Orthopedic Surgery | Admitting: Orthopedic Surgery

## 2011-06-12 DIAGNOSIS — M25519 Pain in unspecified shoulder: Secondary | ICD-10-CM

## 2011-06-12 DIAGNOSIS — M6281 Muscle weakness (generalized): Secondary | ICD-10-CM

## 2011-06-12 DIAGNOSIS — M7540 Impingement syndrome of unspecified shoulder: Secondary | ICD-10-CM

## 2011-06-12 NOTE — Progress Notes (Signed)
Occupational Therapy Treatment Patient Details  Name: [name] MRN: [id number] Date of Birth: November 02, 1945  Today's Date: 06/12/2011 Time: 7829-5621 OT Time Calculation (min): 61 min Manual Therapy [phone] 22' Therapeutic Exercises [phone] 1' Visit#: 13  of 24   Re-eval: 07/08/11    Subjective Symptoms/Limitations Symptoms: S:  The MD was pleased with my progress. Pain Assessment Currently in Pain?: Yes Pain Score:   1 Pain Location: Shoulder Pain Orientation: Right Pain Type: Acute pain  Precautions/Restrictions   N/A  Exercise/Treatments Supine Protraction: PROM;10 reps;Strengthening;15 reps Protraction Weight (lbs): 2# Horizontal ABduction: PROM;10 reps;Strengthening;15 reps Horizontal ABduction Weight (lbs): 2# External Rotation: PROM;10 reps;Strengthening;15 reps External Rotation Weight (lbs): 2# Internal Rotation: PROM;10 reps;Strengthening;15 reps Internal Rotation Weight (lbs): 2# Flexion: PROM;10 reps;Strengthening;15 reps Shoulder Flexion Weight (lbs): 2# ABduction: PROM;10 reps;Strengthening;15 reps Shoulder ABduction Weight (lbs): 2# Seated Protraction: Strengthening;15 reps Protraction Weight (lbs): 2 Horizontal ABduction: Strengthening;15 reps Horizontal ABduction Weight (lbs): 2 External Rotation: Strengthening;15 reps External Rotation Weight (lbs): 2 Internal Rotation: Strengthening;15 reps Internal Rotation Weight (lbs): 2 Flexion: Strengthening;15 reps Flexion Weight (lbs): 2 Abduction: Strengthening;15 reps ABduction Weight (lbs): 2 Therapy Balls  Right/Left: 5 reps ROM / Strengthening / Isometric Strengthening UBE (Upper Arm Bike): 3' and 3' 2.5 Cybex Press: 2 plate;20 reps Cybex Row: 2 plate;20 reps Wall Wash: 3' with 2# Over Head Lace: 2# x 1' Wall Pushups: 10 reps "W" Arms: 15 with 2# X to V Arms: 15 with2# Graduated Retraction with Theraband: green x 3 Sustained Retraction with Theraband: green x 3      Manual  Therapy Manual Therapy: Myofascial release Myofascial Release: MFR and manual stretching to right upper arm, shoulder, and scapular region to decrease pain and increase mobility.  Scar release to each incision [phone]  Occupational Therapy Assessment and Plan OT Assessment and Plan Clinical Impression Statement: A:  Added sustained and progressive retraction with theraband and overhead lace. OT Plan: Increase to 3# with supine strengthening, attempt prone strengthening.  Administer UEFI   Goals Short Term Goals Time to Complete Short Term Goals: 4 weeks Short Term Goal 1: Patient will be educated on a HEP. Short Term Goal 2: Patient will increase PROM in right shoulder to [facility] for increased ability to swing golf club. Short Term Goal 3: Patient will increase right shoulder strength to 3+/5 for increased ability to lift gardening tools. Short Term Goal 4: Patient will decrease pain to 2/10 when reaching overhead. Short Term Goal 5: Patient will decrease fascial restrictions to minimal in his right shoulder region. Long Term Goals Time to Complete Long Term Goals: 8 weeks Long Term Goal 1: Patient will return to prior level of I with all B/IADLs, work, and leisure activities. Long Term Goal 2: Patient will increase right shoulder AROM to WNL for increased ability to swing golf club. Long Term Goal 3: Patient will have 5/5 strength in his right shoulder for increased ability to lift gardening tools. Long Term Goal 4: Patient will have 1/10 pain in his right shoulder when hitting golf balls. Long Term Goal 5: Patient will have trace fascial restrictions in his right shoulder region.  Problem List Patient Active Problem List  Diagnoses  . Impingement syndrome of shoulder region  . Pain in joint, shoulder region  . Muscle weakness (generalized)    End of Session Activity Tolerance: Patient tolerated treatment well General Behavior During Session: [facility] for tasks performed Cognition: [facility]  for tasks performed  GO No functional reporting required  Shirlean Mylar,  OTR/L  06/12/2011, 10:16 AM

## 2011-06-15 ENCOUNTER — Ambulatory Visit (HOSPITAL_COMMUNITY)
Admission: RE | Admit: 2011-06-15 | Discharge: 2011-06-15 | Disposition: A | Discharge status: Home / Self Care | Payer: Medicare Other | Source: Ambulatory Visit | Attending: Specialist | Admitting: Specialist

## 2011-06-15 DIAGNOSIS — M7540 Impingement syndrome of unspecified shoulder: Secondary | ICD-10-CM

## 2011-06-15 DIAGNOSIS — M6281 Muscle weakness (generalized): Secondary | ICD-10-CM

## 2011-06-15 DIAGNOSIS — M25519 Pain in unspecified shoulder: Secondary | ICD-10-CM

## 2011-06-15 NOTE — Progress Notes (Signed)
Occupational Therapy Treatment Patient Details  Name: [name] MRN: [id number] Date of Birth: 10-07-1945  Today's Date: 06/15/2011 Time: 7829-5621 OT Time Calculation (min): 54 min Manual Therapy [phone] 18' Therapeutic Exercises [phone] 36' Visit#: 14  of 24   Re-eval: 07/08/11     Subjective Symptoms/Limitations Symptoms: S:  Im doing pretty well. Pain Assessment Currently in Pain?: Yes Pain Score:   1 Pain Orientation: Right Pain Type: Acute pain  Precautions/Restrictions     Exercise/Treatments Supine Protraction: PROM;Strengthening;10 reps Protraction Weight (lbs): 3 Horizontal ABduction: PROM;Strengthening;10 reps Horizontal ABduction Weight (lbs): 3 External Rotation: PROM;Strengthening;10 reps External Rotation Weight (lbs): 3 Internal Rotation: PROM;Strengthening;10 reps Internal Rotation Weight (lbs): 3 Flexion: PROM;Strengthening;10 reps Shoulder Flexion Weight (lbs): 3 ABduction: PROM;Strengthening Shoulder ABduction Weight (lbs): 2 Seated Protraction: Strengthening;10 reps Protraction Weight (lbs): 3 Horizontal ABduction: Strengthening;10 reps Horizontal ABduction Weight (lbs): 3 External Rotation: Strengthening;10 reps External Rotation Weight (lbs): 3 Internal Rotation: Strengthening;10 reps Internal Rotation Weight (lbs): 3 Flexion: Strengthening;10 reps Flexion Weight (lbs): 3 Abduction: Strengthening;10 reps ABduction Weight (lbs): 2 Therapy Ball Right/Left:  (dc) ROM / Strengthening / Isometric Strengthening UBE (Upper Arm Bike): 3' and 3' 3.0 Cybex Press: 2 plate;20 reps Cybex Row: 2 plate;20 reps Wall Wash: 3' wtih 3# Over Head Lace: 2' with 3# Wall Pushups:  (resume next visit) "W" Arms: 15 with 2# X to V Arms: 15 with2# Graduated Retraction with Theraband: resume next visit Sustained Retraction with Theraband: resume next visit      Manual Therapy Manual Therapy: Myofascial release Myofascial Release: MFR and manual  stretching to right upper arm, shoulder, and scapular region to decrease pain and increase mobility.  Scar release to each incision.  [phone]  Occupational Therapy Assessment and Plan OT Assessment and Plan Clinical Impression Statement: A:  Increased to 3# with strengthening exercises except for abduction. OT Plan: P:  Resume missed exercises and attempt 3#  with abduction and administer UEFI.   Goals Short Term Goals Time to Complete Short Term Goals: 4 weeks Short Term Goal 1: Patient will be educated on a HEP. Short Term Goal 2: Patient will increase PROM in right shoulder to [facility] for increased ability to swing golf club. Short Term Goal 3: Patient will increase right shoulder strength to 3+/5 for increased ability to lift gardening tools. Short Term Goal 4: Patient will decrease pain to 2/10 when reaching overhead. Short Term Goal 5: Patient will decrease fascial restrictions to minimal in his right shoulder region. Long Term Goals Time to Complete Long Term Goals: 8 weeks Long Term Goal 1: Patient will return to prior level of I with all B/IADLs, work, and leisure activities. Long Term Goal 2: Patient will increase right shoulder AROM to WNL for increased ability to swing golf club. Long Term Goal 3: Patient will have 5/5 strength in his right shoulder for increased ability to lift gardening tools. Long Term Goal 4: Patient will have 1/10 pain in his right shoulder when hitting golf balls. Long Term Goal 5: Patient will have trace fascial restrictions in his right shoulder region.  Problem List Patient Active Problem List  Diagnoses  . Impingement syndrome of shoulder region  . Pain in joint, shoulder region  . Muscle weakness (generalized)    End of Session Activity Tolerance: Patient tolerated treatment well General Cognition: WFL for tasks performed  GO No functional reporting required  Shirlean Mylar, OTR/L  06/15/2011, 10:34 AM

## 2011-06-16 ENCOUNTER — Ambulatory Visit (HOSPITAL_COMMUNITY)
Admission: RE | Admit: 2011-06-16 | Discharge: 2011-06-16 | Disposition: A | Discharge status: Home / Self Care | Payer: Medicare Other | Source: Ambulatory Visit | Attending: Family Medicine | Admitting: Family Medicine

## 2011-06-16 DIAGNOSIS — M6281 Muscle weakness (generalized): Secondary | ICD-10-CM

## 2011-06-16 DIAGNOSIS — M25519 Pain in unspecified shoulder: Secondary | ICD-10-CM

## 2011-06-16 DIAGNOSIS — M7540 Impingement syndrome of unspecified shoulder: Secondary | ICD-10-CM

## 2011-06-16 NOTE — Progress Notes (Signed)
Occupational Therapy Treatment Patient Details  Name: [name] MRN: [id number] Date of Birth: July 14, 1945  Today's Date: 06/16/2011 Time: 0806-0900 OT Time Calculation (min): 54 min Manual Therapy [phone] 18' Therapeutic Exercises [phone] 36' Visit#: 1  of 24   Re-eval: 07/08/11     Subjective Symptoms/Limitations Symptoms: S:  I can put my billfold in my back pocket now.  That moveemnt out to the side still gets me a little bit. Pain Assessment Currently in Pain?: Yes Pain Score:   1 Pain Location: Shoulder Pain Orientation: Right Pain Type: Acute pain  Precautions/Restrictions     Exercise/Treatments Supine Protraction: PROM;10 reps;Strengthening;12 reps Protraction Weight (lbs): 3 Horizontal ABduction: PROM;10 reps;Strengthening;12 reps Horizontal ABduction Weight (lbs): 3 External Rotation: PROM;10 reps;Strengthening;12 reps External Rotation Weight (lbs): 3 Internal Rotation: PROM;10 reps;Strengthening;12 reps Internal Rotation Weight (lbs): 3 Flexion: PROM;10 reps;Strengthening;12 reps Shoulder Flexion Weight (lbs): 3 ABduction: PROM;10 reps;Strengthening;12 reps Shoulder ABduction Weight (lbs): 2 Seated Protraction: Strengthening;12 reps Protraction Weight (lbs): 3 Horizontal ABduction: Strengthening;12 reps Horizontal ABduction Weight (lbs): 3 External Rotation: Strengthening;12 reps External Rotation Weight (lbs): 3 Internal Rotation: Strengthening;12 reps Internal Rotation Weight (lbs): 3 Flexion: Strengthening;12 reps Flexion Weight (lbs): 3 Abduction: Strengthening;12 reps ABduction Weight (lbs): 2 ROM / Strengthening / Isometric Strengthening UBE (Upper Arm Bike): 3' and 3' 3.0 Cybex Press: 2.5 plate;15 reps Cybex Row: 2.5 plate;15 reps Wall Wash: 3' with 3# Over Head Lace: 3' with 3# Wall Pushups: 15 reps "W" Arms: 10 with 3# X to V Arms: 10 with 3# Graduated Retraction with Theraband: green x 3 Sustained Retraction with Theraband:  green x 3      Manual Therapy Manual Therapy: Myofascial release Myofascial Release: MFR and manual stretching to right upper arm, shoulder, and scapular region to decrease pain and increase mobility.  [phone]  Occupational Therapy Assessment and Plan OT Assessment and Plan Clinical Impression Statement: A:  UEFI  63/80 78% was 64/80 = 80%. OT Plan: P:  Attempt 3# with abduction   Goals Short Term Goals Time to Complete Short Term Goals: 4 weeks Short Term Goal 1: Patient will be educated on a HEP. Short Term Goal 2: Patient will increase PROM in right shoulder to [facility] for increased ability to swing golf club. Short Term Goal 3: Patient will increase right shoulder strength to 3+/5 for increased ability to lift gardening tools. Short Term Goal 4: Patient will decrease pain to 2/10 when reaching overhead. Short Term Goal 5: Patient will decrease fascial restrictions to minimal in his right shoulder region. Long Term Goals Time to Complete Long Term Goals: 8 weeks Long Term Goal 1: Patient will return to prior level of I with all B/IADLs, work, and leisure activities. Long Term Goal 1 Progress: Progressing toward goal Long Term Goal 2: Patient will increase right shoulder AROM to WNL for increased ability to swing golf club. Long Term Goal 2 Progress: Progressing toward goal Long Term Goal 3: Patient will have 5/5 strength in his right shoulder for increased ability to lift gardening tools. Long Term Goal 3 Progress: Progressing toward goal Long Term Goal 4: Patient will have 1/10 pain in his right shoulder when hitting golf balls. Long Term Goal 4 Progress: Progressing toward goal Long Term Goal 5: Patient will have trace fascial restrictions in his right shoulder region. Long Term Goal 5 Progress: Progressing toward goal  Problem List Patient Active Problem List  Diagnoses  . Impingement syndrome of shoulder region  . Pain in joint, shoulder region  .  Muscle weakness  (generalized)    End of Session Activity Tolerance: Patient tolerated treatment well General Behavior During Session: [facility] for tasks performed Cognition: [facility] for tasks performed  GO No functional reporting required  Shirlean Mylar, OTR/L  06/16/2011, 9:56 AM

## 2011-06-18 ENCOUNTER — Ambulatory Visit (HOSPITAL_COMMUNITY): Payer: Medicare Other | Admitting: Specialist

## 2011-06-19 ENCOUNTER — Ambulatory Visit (HOSPITAL_COMMUNITY)
Admission: RE | Admit: 2011-06-19 | Discharge: 2011-06-19 | Disposition: A | Discharge status: Home / Self Care | Payer: Medicare Other | Source: Ambulatory Visit | Attending: Family Medicine | Admitting: Family Medicine

## 2011-06-19 DIAGNOSIS — M6281 Muscle weakness (generalized): Secondary | ICD-10-CM

## 2011-06-19 DIAGNOSIS — M7540 Impingement syndrome of unspecified shoulder: Secondary | ICD-10-CM

## 2011-06-19 DIAGNOSIS — M25519 Pain in unspecified shoulder: Secondary | ICD-10-CM

## 2011-06-19 NOTE — Progress Notes (Signed)
Occupational Therapy Treatment Patient Details  Name: [name] MRN: [id number] Date of Birth: May 27, 1945  Today's Date: 06/19/2011 Time: 0806-0910 OT Time Calculation (min): 64 min Manual Therapy [phone] 24' Therapeutic Exercises 830-910 40' Visit#: 16  of 24   Re-eval: 07/08/11    Subjective Symptoms/Limitations Symptoms: S:  I tried to move my chicken coop yesterday, and I needed help. Pain Assessment Currently in Pain?: No/denies  Precautions/Restrictions   N/A  Exercise/Treatments Supine Protraction: PROM;10 reps;Strengthening;12 reps Protraction Weight (lbs): 3 Horizontal ABduction: PROM;10 reps;Strengthening;12 reps Horizontal ABduction Weight (lbs): 3 External Rotation: PROM;10 reps;Strengthening;12 reps External Rotation Weight (lbs): 3 Internal Rotation: PROM;10 reps;Strengthening;12 reps Internal Rotation Weight (lbs): 3 Flexion: PROM;10 reps;Strengthening;12 reps Shoulder Flexion Weight (lbs): 3 ABduction: PROM;10 reps;Strengthening;12 reps Shoulder ABduction Weight (lbs): 3 Seated Protraction: Strengthening;12 reps Protraction Weight (lbs): 3 Horizontal ABduction: Strengthening;12 reps Horizontal ABduction Weight (lbs): 3 External Rotation: Strengthening;12 reps External Rotation Weight (lbs): 3 Internal Rotation: Strengthening;12 reps Internal Rotation Weight (lbs): 3 Flexion: Strengthening;12 reps Flexion Weight (lbs): 3 Abduction: Strengthening;12 reps ABduction Weight (lbs): 3 ROM / Strengthening / Isometric Strengthening UBE (Upper Arm Bike): 3' and 3' 3.5 Cybex Press: 2.5 plate;20 reps Cybex Row: 2.5 plate;20 reps Wall Wash: 4' with 3# Over Head Lace: 4' with 3# Wall Pushups: 15 reps "W" Arms: 12 with 3# X to V Arms: 12 with 3# Graduated Retraction with Theraband: green x 5 Sustained Retraction with Theraband: green x 5      Manual Therapy Manual Therapy: Myofascial release Myofascial Release: MFR and manual stretching to  right upper arm, shoulder, and scapular region to decrease pain and increase mobility.  [phone]  Occupational Therapy Assessment and Plan OT Assessment and Plan Clinical Impairments Affecting Rehab Potential: A:  Able to complete abduction with 3# in supine and seated. OT Plan: P:  DC supine strengthening.   Goals Short Term Goals Time to Complete Short Term Goals: 4 weeks Short Term Goal 1: Patient will be educated on a HEP. Short Term Goal 1 Progress: Progressing toward goal Short Term Goal 2: Patient will increase PROM in right shoulder to [facility] for increased ability to swing golf club. Short Term Goal 2 Progress: Progressing toward goal Short Term Goal 3: Patient will increase right shoulder strength to 3+/5 for increased ability to lift gardening tools. Short Term Goal 3 Progress: Progressing toward goal Short Term Goal 4: Patient will decrease pain to 2/10 when reaching overhead. Short Term Goal 4 Progress: Progressing toward goal Short Term Goal 5: Patient will decrease fascial restrictions to minimal in his right shoulder region. Short Term Goal 5 Progress: Progressing toward goal Long Term Goals Time to Complete Long Term Goals: 8 weeks Long Term Goal 1: Patient will return to prior level of I with all B/IADLs, work, and leisure activities. Long Term Goal 1 Progress: Progressing toward goal Long Term Goal 2: Patient will increase right shoulder AROM to WNL for increased ability to swing golf club. Long Term Goal 2 Progress: Progressing toward goal Long Term Goal 3: Patient will have 5/5 strength in his right shoulder for increased ability to lift gardening tools. Long Term Goal 3 Progress: Progressing toward goal Long Term Goal 4: Patient will have 1/10 pain in his right shoulder when hitting golf balls. Long Term Goal 4 Progress: Progressing toward goal Long Term Goal 5: Patient will have trace fascial restrictions in his right shoulder region. Long Term Goal 5 Progress:  Progressing toward goal  Problem List Patient Active Problem List  Diagnoses  . Impingement syndrome of shoulder region  . Pain in joint, shoulder region  . Muscle weakness (generalized)    End of Session Activity Tolerance: Patient tolerated treatment well General Behavior During Session: [facility] for tasks performed Cognition: [facility] for tasks performed  GO No functional reporting required  Shirlean Mylar, OTR/L  06/19/2011, 9:40 AM

## 2011-06-23 ENCOUNTER — Ambulatory Visit (HOSPITAL_COMMUNITY)
Admission: RE | Admit: 2011-06-23 | Discharge: 2011-06-23 | Disposition: A | Discharge status: Home / Self Care | Payer: Medicare Other | Source: Ambulatory Visit | Attending: Family Medicine | Admitting: Family Medicine

## 2011-06-23 DIAGNOSIS — M25519 Pain in unspecified shoulder: Secondary | ICD-10-CM

## 2011-06-23 DIAGNOSIS — M6281 Muscle weakness (generalized): Secondary | ICD-10-CM

## 2011-06-23 DIAGNOSIS — M7540 Impingement syndrome of unspecified shoulder: Secondary | ICD-10-CM

## 2011-06-23 NOTE — Progress Notes (Signed)
Occupational Therapy Treatment Patient Details  Name: [name] MRN: [id number] Date of Birth: 1945-11-28  Today's Date: 06/23/2011 Time: 4098-1191 OT Time Calculation (min): 53 min Manual Therapy [phone] 25' Therapeutic Exercises [phone] 28' Visit#: 17  of 24   Re-eval: 07/08/11     Subjective Symptoms/Limitations Symptoms: S:  Its not as painful just tired. (discussing abduction). Pain Assessment Currently in Pain?: No/denies  Precautions/Restrictions   N/A  0:  Exercise/Treatments Supine Protraction: PROM;10 reps Horizontal ABduction: PROM;10 reps External Rotation: PROM;10 reps Internal Rotation: PROM;10 reps Flexion: PROM;10 reps ABduction: PROM;10 reps Seated Protraction: Strengthening;15 reps Protraction Weight (lbs): 3 Horizontal ABduction: Strengthening;15 reps Horizontal ABduction Weight (lbs): 3 External Rotation: Strengthening;15 reps External Rotation Weight (lbs): 3 Internal Rotation: Strengthening;15 reps Internal Rotation Weight (lbs): 3 Flexion: Strengthening;15 reps Flexion Weight (lbs): 3 Abduction: Strengthening;15 reps ABduction Weight (lbs): 3 ROM / Strengthening / Isometric Strengthening UBE (Upper Arm Bike): 3' and 3' 3.5 Cybex Press: 2.5 plate;20 reps Cybex Row: 2.5 plate;20 reps Wall Wash: 4' with 3# Over Head Lace: 4' with 3# Wall Pushups: 15 reps "W" Arms: 15 wtih 3# X to V Arms: 15 wtih 3# Graduated Retraction with Theraband: green x 5 Sustained Retraction with Theraband: green x 5      Manual Therapy Manual Therapy: Myofascial release Myofascial Release: MFR and manual stretching to right upper arm, shoulder, and scapular region to decrease pain and increase mobility.  [phone]  Occupational Therapy Assessment and Plan OT Assessment and Plan Clinical Impression Statement: A:  No pain with abduction strengthening, just fatiguing. OT Plan: P:  Add prone strengthening.    Goals Short Term Goals Time to Complete  Short Term Goals: 4 weeks Short Term Goal 1: Patient will be educated on a HEP. Short Term Goal 2: Patient will increase PROM in right shoulder to [facility] for increased ability to swing golf club. Short Term Goal 3: Patient will increase right shoulder strength to 3+/5 for increased ability to lift gardening tools. Short Term Goal 4: Patient will decrease pain to 2/10 when reaching overhead. Short Term Goal 5: Patient will decrease fascial restrictions to minimal in his right shoulder region. Long Term Goals Time to Complete Long Term Goals: 8 weeks Long Term Goal 1: Patient will return to prior level of I with all B/IADLs, work, and leisure activities. Long Term Goal 2: Patient will increase right shoulder AROM to WNL for increased ability to swing golf club. Long Term Goal 3: Patient will have 5/5 strength in his right shoulder for increased ability to lift gardening tools. Long Term Goal 4: Patient will have 1/10 pain in his right shoulder when hitting golf balls. Long Term Goal 5: Patient will have trace fascial restrictions in his right shoulder region.  Problem List Patient Active Problem List  Diagnoses  . Impingement syndrome of shoulder region  . Pain in joint, shoulder region  . Muscle weakness (generalized)    End of Session Activity Tolerance: Patient tolerated treatment well General Behavior During Session: [facility] for tasks performed Cognition: [facility] for tasks performed  GO No functional reporting required  Shirlean Mylar, OTR/L  06/23/2011, 10:50 AM

## 2011-06-25 ENCOUNTER — Ambulatory Visit (HOSPITAL_COMMUNITY)
Admission: RE | Admit: 2011-06-25 | Discharge: 2011-06-25 | Disposition: A | Discharge status: Home / Self Care | Payer: Medicare Other | Source: Ambulatory Visit | Attending: Family Medicine | Admitting: Family Medicine

## 2011-06-25 NOTE — Progress Notes (Signed)
Occupational Therapy Treatment Patient Details  Name: [name] MRN: [id number] Date of Birth: 04-Sep-1945  Today's Date: 06/25/2011 Time: 4098-1191 OT Time Calculation (min): 49 min Manual Therapy [phone] 15' Therapeutic Exercises [phone] 24' Visit#: 18  of 24   Re-eval: 07/08/11    Subjective Symptoms/Limitations Symptoms: S:  I hit a bag of balls yesterday and it felt fine. Pain Assessment Currently in Pain?: No/denies Pain Score: 0-No pain   O:  Exercise/Treatments Supine Protraction: PROM;10 reps Horizontal ABduction: PROM;10 reps External Rotation: PROM;10 reps Internal Rotation: PROM;10 reps Flexion: PROM;10 reps ABduction: PROM;10 reps Seated Protraction: Strengthening;15 reps Protraction Weight (lbs): 3 Horizontal ABduction: Strengthening;15 reps Horizontal ABduction Weight (lbs): 3 External Rotation: Strengthening;15 reps External Rotation Weight (lbs): 3 Internal Rotation: Strengthening;15 reps Internal Rotation Weight (lbs): 3 Flexion: Strengthening;15 reps Flexion Weight (lbs): 3 Abduction: Strengthening;15 reps ABduction Weight (lbs): 3 Prone  Retraction: Strengthening;10 reps Retraction Weight (lbs): 2 Flexion: Strengthening;10 reps Flexion Weight (lbs): 2 Extension: Strengthening;10 reps Extension Weight (lbs): 2 External Rotation: Strengthening;10 reps External Rotation Weight (lbs): 2 Internal Rotation: Strengthening;10 reps Internal Rotation Weight (lbs): 2 Horizontal ABduction 1: Strengthening;10 reps Horizontal ABduction 1 Weight (lbs): 2 Horizontal ABduction 2: Strengthening;10 reps Horizontal ABduction 2 Weight (lbs): 2 ROM / Strengthening / Isometric Strengthening UBE (Upper Arm Bike): 3' and 3' 3.5 Cybex Press: 3 plate;15 reps Cybex Row: 3 plate;15 reps Wall Wash: 2' with 4# Over Head Lace: 2' with 4# Wall Pushups: 20 reps "W" Arms: 15 with 3# X to V Arms: 15 with 3#      Manual Therapy Manual Therapy: Myofascial  release Myofascial Release: MFR and manual stretching to right upper arm, shoulder, and scapular region to decrease pain and increase mobility.  [phone]  Occupational Therapy Assessment and Plan OT Assessment and Plan Clinical Impression Statement: A:  Added prone strengthening and increased weight to 4# with sustained activity tolerance exercises. Transition focus of treatment to strengthening. OT Frequency: Min 2X/week OT Duration: 4 weeks OT Plan: P: Decrease frequency to 2 times a week.  Increase reps and duration with all exercises.   Goals Short Term Goals Time to Complete Short Term Goals: 4 weeks Short Term Goal 1: Patient will be educated on a HEP. Short Term Goal 1 Progress: Met Short Term Goal 2: Patient will increase PROM in right shoulder to [facility] for increased ability to swing golf club. Short Term Goal 2 Progress: Met Short Term Goal 3: Patient will increase right shoulder strength to 3+/5 for increased ability to lift gardening tools. Short Term Goal 3 Progress: Met Short Term Goal 4: Patient will decrease pain to 2/10 when reaching overhead. Short Term Goal 4 Progress: Met Short Term Goal 5: Patient will decrease fascial restrictions to minimal in his right shoulder region. Short Term Goal 5 Progress: Met Long Term Goals Time to Complete Long Term Goals: 8 weeks Long Term Goal 1: Patient will return to prior level of I with all B/IADLs, work, and leisure activities. Long Term Goal 1 Progress: Progressing toward goal Long Term Goal 2: Patient will increase right shoulder AROM to WNL for increased ability to swing golf club. Long Term Goal 2 Progress: Progressing toward goal Long Term Goal 3: Patient will have 5/5 strength in his right shoulder for increased ability to lift gardening tools. Long Term Goal 3 Progress: Progressing toward goal Long Term Goal 4: Patient will have 1/10 pain in his right shoulder when hitting golf balls. Long Term Goal 4 Progress: Progressing  toward goal  Long Term Goal 5: Patient will have trace fascial restrictions in his right shoulder region. Long Term Goal 5 Progress: Progressing toward goal  Problem List Patient Active Problem List  Diagnoses  . Impingement syndrome of shoulder region  . Pain in joint, shoulder region  . Muscle weakness (generalized)    End of Session Activity Tolerance: Patient tolerated treatment well General Behavior During Session: [facility] for tasks performed Cognition: [facility] for tasks performed  GO No functional reporting required  Shirlean Mylar, OTR/L  06/25/2011, 8:59 AM

## 2011-06-26 ENCOUNTER — Ambulatory Visit (HOSPITAL_COMMUNITY): Payer: Medicare Other | Admitting: Specialist

## 2011-06-30 ENCOUNTER — Ambulatory Visit (HOSPITAL_COMMUNITY)
Admission: RE | Admit: 2011-06-30 | Discharge: 2011-06-30 | Disposition: A | Discharge status: Home / Self Care | Payer: Medicare Other | Source: Ambulatory Visit | Attending: Family Medicine | Admitting: Family Medicine

## 2011-06-30 NOTE — Progress Notes (Signed)
Occupational Therapy Treatment Patient Details  Name: BRAEDAN MEUTH MRN: [id number] Date of Birth: Aug 05, 1945  Today's Date: 06/30/2011 Time: 4098-1191 OT Time Calculation (min): 55 min Manual Therapy [phone] 15' Therapeutic Exercise [phone] 40' Visit#: 19  of 24   Re-eval: 07/08/11    Subjective Symptoms/Limitations Symptoms: S:  My arm feels a little painful along the front (biceps tendon) when I use it now. Pain Assessment Currently in Pain?: Yes Pain Score:   1 Pain Location: Shoulder Pain Orientation: Right;Anterior Pain Type: Acute pain  Precautions/Restrictions     Exercise/Treatments   06/30/11 0700  Shoulder Exercises: Supine  Protraction PROM;10 reps  Horizontal ABduction PROM;10 reps  External Rotation PROM;10 reps  Internal Rotation PROM;10 reps  Flexion PROM;10 reps  ABduction PROM;10 reps  Shoulder Exercises: Seated  Protraction Strengthening;10 reps  Protraction Weight (lbs) 4  Horizontal ABduction Strengthening;10 reps  Horizontal ABduction Weight (lbs) 4  External Rotation Strengthening;10 reps  External Rotation Weight (lbs) 4  Internal Rotation Strengthening;10 reps  Internal Rotation Weight (lbs) 4  Flexion Strengthening;10 reps  Flexion Weight (lbs) 4  Abduction Strengthening;10 reps  ABduction Weight (lbs) 4  Shoulder Exercises: Prone  Retraction Strengthening;12 reps  Retraction Weight (lbs) 2  Flexion Strengthening;12 reps  Flexion Weight (lbs) 2  Extension Strengthening;12 reps  Extension Weight (lbs) 2  External Rotation Strengthening;12 reps  External Rotation Weight (lbs) 2  Internal Rotation Strengthening;12 reps  Internal Rotation Weight (lbs) 2  Horizontal ABduction 1 Strengthening;12 reps  Horizontal ABduction 1 Weight (lbs) 2  Horizontal ABduction 2 Strengthening;12 reps  Horizontal ABduction 2 Weight (lbs) 2  Shoulder Exercises: ROM/Strengthening  UBE (Upper Arm Bike) 3' and 3' 4.0  Cybex Press 3 plate;20 reps    Cybex Row 3 plate;20 reps  Wall Wash 3' with 4#  Over Head Lace 3' with 4#  Wall Pushups Other (comment) (25)  "W" Arms 10 with 4#  X to V Arms 10 with 4#  Graduated Retraction with Theraband blue x 3   Sustained Retraction with Theraband blue x 3           Manual Therapy Manual Therapy: Myofascial release Myofascial Release: MFR and manual stretching to right upper arm, shoulder, and scapular region to decrease pain and increase mobility [phone]  Occupational Therapy Assessment and Plan OT Assessment and Plan Clinical Impression Statement: A:  Increased time and reps with all strengthening exercises.  Noted increased pain in anterior shoulder with prone retraction. OT Plan: P:  Analyze golf swing and simulate with body blade in prep for returning to golf with less pain.   Goals Short Term Goals Time to Complete Short Term Goals: 4 weeks Short Term Goal 1: Patient will be educated on a HEP. Short Term Goal 2: Patient will increase PROM in right shoulder to [facility] for increased ability to swing golf club. Short Term Goal 3: Patient will increase right shoulder strength to 3+/5 for increased ability to lift gardening tools. Short Term Goal 4: Patient will decrease pain to 2/10 when reaching overhead. Short Term Goal 5: Patient will decrease fascial restrictions to minimal in his right shoulder region. Long Term Goals Time to Complete Long Term Goals: 8 weeks Long Term Goal 1: Patient will return to prior level of I with all B/IADLs, work, and leisure activities. Long Term Goal 2: Patient will increase right shoulder AROM to WNL for increased ability to swing golf club. Long Term Goal 3: Patient will have 5/5 strength in his right shoulder for increased  ability to lift gardening tools. Long Term Goal 4: Patient will have 1/10 pain in his right shoulder when hitting golf balls. Long Term Goal 5: Patient will have trace fascial restrictions in his right shoulder region.  Problem  List Patient Active Problem List  Diagnoses  . Impingement syndrome of shoulder region  . Pain in joint, shoulder region  . Muscle weakness (generalized)    End of Session Activity Tolerance: Patient tolerated treatment well General Behavior During Session: [facility] for tasks performed Cognition: [facility] for tasks performed  GO No functional reporting required  Shirlean Mylar, OTR/L  06/30/2011, 11:11 AM

## 2011-07-02 ENCOUNTER — Ambulatory Visit (HOSPITAL_COMMUNITY)
Admission: RE | Admit: 2011-07-02 | Discharge: 2011-07-02 | Disposition: A | Discharge status: Home / Self Care | Payer: Medicare Other | Source: Ambulatory Visit | Attending: Family Medicine | Admitting: Family Medicine

## 2011-07-02 DIAGNOSIS — M25519 Pain in unspecified shoulder: Secondary | ICD-10-CM

## 2011-07-02 DIAGNOSIS — M6281 Muscle weakness (generalized): Secondary | ICD-10-CM

## 2011-07-02 DIAGNOSIS — M7540 Impingement syndrome of unspecified shoulder: Secondary | ICD-10-CM

## 2011-07-02 NOTE — Evaluation (Signed)
Occupational Therapy Evaluation  Patient Details  Name: [name] MRN: [id number] Date of Birth: [birth year]  Today's Date: 07/02/2011 Time: 1191-4782 OT Time Calculation (min): 49 min  Manual Therapy: 956-213 14' Therapeutic Exercise: 820-855 35' Visit#: 20  of 24   Re-eval: 07/02/11    Subjective Symptoms/Limitations Symptoms: S: I golfed yesterday and did well. I feel ready to discharge today. Pain Assessment Currently in Pain?: No/denies Pain Score: 0-No pain  Additional Assessments RUE AROM (degrees) Right Shoulder Flexion  0-170: 148 Degrees Right Shoulder ABduction 0-170: 145 Degrees Right Shoulder Internal Rotation  0-90: 55 Degrees Right Shoulder External Rotation  0-90: 60 Degrees RUE Strength Right Shoulder Flexion: 5/5 Right Shoulder ABduction: 5/5 Right Shoulder Internal Rotation: 5/5 Right Shoulder External Rotation: 5/5     Exercise/Treatments Supine Protraction: PROM;10 reps Horizontal ABduction: PROM;10 reps External Rotation: PROM;10 reps Internal Rotation: PROM;10 reps Flexion: PROM;10 reps ABduction: PROM;10 reps Seated Protraction: Strengthening;15 reps Protraction Weight (lbs): 4 Horizontal ABduction: Strengthening;15 reps Horizontal ABduction Weight (lbs): 4 External Rotation: Strengthening;12 reps External Rotation Weight (lbs): 4 Internal Rotation: Strengthening;12 reps Internal Rotation Weight (lbs): 4 Flexion: Strengthening;12 reps Flexion Weight (lbs): 4 Abduction: Strengthening;12 reps ABduction Weight (lbs): 4 Prone  Retraction: Strengthening;15 reps Retraction Weight (lbs): 2 Flexion: Strengthening;15 reps Flexion Weight (lbs): 2 Extension: Strengthening;15 reps Extension Weight (lbs): 2 External Rotation: Strengthening;15 reps External Rotation Weight (lbs): 2 Internal Rotation: Strengthening;15 reps Internal Rotation Weight (lbs): 2 Horizontal ABduction 1: Strengthening;15 reps Horizontal ABduction 1 Weight  (lbs): 2 Horizontal ABduction 2: Strengthening;15 reps Horizontal ABduction 2 Weight (lbs): 2  ROM / Strengthening / Isometric Strengthening Wall Wash: 3\' 30"  with 4# Over Head Lace: 3' with 4# Pushups: 10 reps (modified ) "W" Arms: 12 with 4# X to V Arms: 12 with 4#      Manual Therapy Myofascial Release: MFR and manual stretching to right upper arm, shoulder, and scapular region to decrease pain and increase mobility [phone].  Occupational Therapy Assessment and Plan OT Assessment and Plan Clinical Impression Statement: A: Increased wall wash duration. Increased to modified pushup. Refer to D/C summary.  OT Plan: P: Discharge from OT services secondary to patient has met all goals and has returned to all daily activites and desired activities.    Goals Short Term Goals Time to Complete Short Term Goals: 4 weeks Short Term Goal 1: Patient will be educated on a HEP. Short Term Goal 2: Patient will increase PROM in right shoulder to [facility] for increased ability to swing golf club. Short Term Goal 3: Patient will increase right shoulder strength to 3+/5 for increased ability to lift gardening tools. Short Term Goal 4: Patient will decrease pain to 2/10 when reaching overhead. Short Term Goal 5: Patient will decrease fascial restrictions to minimal in his right shoulder region. Long Term Goals Time to Complete Long Term Goals: 8 weeks Long Term Goal 1: Patient will return to prior level of I with all B/IADLs, work, and leisure activities. Long Term Goal 1 Progress: Met Long Term Goal 2: Patient will increase right shoulder AROM to WNL for increased ability to swing golf club. Long Term Goal 2 Progress: Met Long Term Goal 3: Patient will have 5/5 strength in his right shoulder for increased ability to lift gardening tools. Long Term Goal 3 Progress: Met Long Term Goal 4: Patient will have 1/10 pain in his right shoulder when hitting golf balls. Long Term Goal 4 Progress: Met Long Term  Goal 5: Patient will have trace fascial  restrictions in his right shoulder region. Long Term Goal 5 Progress: Met  Problem List Patient Active Problem List  Diagnoses  . Impingement syndrome of shoulder region  . Pain in joint, shoulder region  . Muscle weakness (generalized)    End of Session Activity Tolerance: Patient tolerated treatment well General Behavior During Session: [facility] for tasks performed Cognition: [facility] for tasks performed OT Plan of Care OT Patient Instructions: Discussed continuation of current activity level.   Armandina Stammer, OTS Note reviewed by clinical instructor and accurately reflects treatment session. Shirlean Mylar, OTR/L  07/02/2011, 9:09 AM  Physician Documentation Your signature is required to indicate approval of the treatment plan as stated above.  Please sign and either send electronically or make a copy of this report for your files and return this physician signed original.  Please mark one 1.__approve of plan  2. ___approve of plan with the following conditions.   ______________________________                                                          _____________________ Physician Signature                                                                                                             Date

## 2011-07-03 ENCOUNTER — Ambulatory Visit (HOSPITAL_COMMUNITY): Payer: Medicare Other | Admitting: Specialist

## 2011-07-07 ENCOUNTER — Ambulatory Visit (HOSPITAL_COMMUNITY): Payer: Medicare Other | Admitting: Specialist

## 2011-07-09 ENCOUNTER — Ambulatory Visit (HOSPITAL_COMMUNITY): Payer: Medicare Other | Admitting: Specialist

## 2011-07-10 ENCOUNTER — Ambulatory Visit (HOSPITAL_COMMUNITY): Payer: Medicare Other | Admitting: Specialist

## 2012-07-10 DIAGNOSIS — C801 Malignant (primary) neoplasm, unspecified: Secondary | ICD-10-CM

## 2012-07-10 HISTORY — DX: Malignant (primary) neoplasm, unspecified: C80.1

## 2012-12-10 HISTORY — PX: PROSTATE SURGERY: SHX751

## 2014-06-12 DIAGNOSIS — E6609 Other obesity due to excess calories: Secondary | ICD-10-CM | POA: Diagnosis not present

## 2014-06-12 DIAGNOSIS — Z0000 Encounter for general adult medical examination without abnormal findings: Secondary | ICD-10-CM | POA: Diagnosis not present

## 2014-06-12 DIAGNOSIS — R7301 Impaired fasting glucose: Secondary | ICD-10-CM | POA: Diagnosis not present

## 2014-06-12 DIAGNOSIS — Z6832 Body mass index (BMI) 32.0-32.9, adult: Secondary | ICD-10-CM | POA: Diagnosis not present

## 2014-06-12 DIAGNOSIS — I10 Essential (primary) hypertension: Secondary | ICD-10-CM | POA: Diagnosis not present

## 2014-08-16 DIAGNOSIS — Z1211 Encounter for screening for malignant neoplasm of colon: Secondary | ICD-10-CM | POA: Diagnosis not present

## 2014-08-17 NOTE — H&P (Signed)
  NTS SOAP Note  Vital Signs:  Vitals as of: 04/16/1694: Systolic 789: Diastolic 89: Heart Rate 94: Temp 97.3F: Height 72ft 6in: Weight 201Lbs 0 Ounces: BMI 32.44  BMI : 32.44 kg/m2  Subjective: This 69 year old male presents for of need for screening TCS.  Last had a TCS over ten years ago.  No family h/o colon cancer.  Denies any gi complaints.  Review of Symptoms:  Constitutional:unremarkable   Head:unremarkable Eyes:unremarkable   Nose/Mouth/Throat:unremarkable Cardiovascular:  unremarkable Respiratory:unremarkable Gastrointestinal:  unremarkable   Genitourinary:unremarkable   Musculoskeletal:unremarkable Skin:unremarkable Hematolgic/Lymphatic:unremarkable   Allergic/Immunologic:unremarkable   Past Medical History:  Reviewed  Past Medical History  Surgical History: prostatectomy, shoulder surgeries Medical Problems: HTN Allergies: pcn Medications: hyzaar   Social History:Reviewed  Social History  Preferred Language: English Race:  White Ethnicity: Not Hispanic / Latino Age: 69 year Marital Status:  M Alcohol: no   Smoking Status: Never smoker reviewed on 08/16/2014 Functional Status reviewed on 08/16/2014 ------------------------------------------------ Bathing: Normal Cooking: Normal Dressing: Normal Driving: Normal Eating: Normal Managing Meds: Normal Oral Care: Normal Shopping: Normal Toileting: Normal Transferring: Normal Walking: Normal Cognitive Status reviewed on 08/16/2014 ------------------------------------------------ Attention: Normal Decision Making: Normal Language: Normal Memory: Normal Motor: Normal Perception: Normal Problem Solving: Normal Visual and Spatial: Normal   Family History:Reviewed  Family Health History Mother, Deceased; Healthy;  Father, Deceased; Healthy;     Objective Information: General:Well appearing, well nourished in no distress. Heart:RRR, no murmur Lungs:  CTA  bilaterally, no wheezes, rhonchi, rales.  Breathing unlabored. Abdomen:Soft, NT/ND, no HSM, no masses. deferred to procedure  Assessment:Need for screening TCS  Diagnoses: V76.51  Z12.11 Screening for malignant neoplasm of colon (Encounter for screening for malignant neoplasm of colon)  Procedures: 38101 - OFFICE OUTPATIENT NEW 20 MINUTES    Plan:  Scheduled for TCS on 08/28/14.   Patient Education:Alternative treatments to surgery were discussed with patient (and family).  Risks and benefits  of procedure including bleeding and perforation were fully explained to the patient (and family) who gave informed consent. Patient/family questions were addressed.  Follow-up:Pending Surgery

## 2014-08-21 DIAGNOSIS — D485 Neoplasm of uncertain behavior of skin: Secondary | ICD-10-CM | POA: Diagnosis not present

## 2014-08-21 DIAGNOSIS — L820 Inflamed seborrheic keratosis: Secondary | ICD-10-CM | POA: Diagnosis not present

## 2014-08-21 DIAGNOSIS — L818 Other specified disorders of pigmentation: Secondary | ICD-10-CM | POA: Diagnosis not present

## 2014-08-21 DIAGNOSIS — L720 Epidermal cyst: Secondary | ICD-10-CM | POA: Diagnosis not present

## 2014-08-21 DIAGNOSIS — L570 Actinic keratosis: Secondary | ICD-10-CM | POA: Diagnosis not present

## 2014-08-28 ENCOUNTER — Encounter (HOSPITAL_COMMUNITY): Payer: Self-pay | Admitting: *Deleted

## 2014-08-28 ENCOUNTER — Encounter (HOSPITAL_COMMUNITY)
Admission: RE | Disposition: A | Discharge status: Home / Self Care | Payer: Self-pay | Source: Ambulatory Visit | Attending: General Surgery

## 2014-08-28 ENCOUNTER — Ambulatory Visit (HOSPITAL_COMMUNITY)
Admission: RE | Admit: 2014-08-28 | Discharge: 2014-08-28 | Disposition: A | Discharge status: Home / Self Care | Payer: Medicare Other | Source: Ambulatory Visit | Attending: General Surgery | Admitting: General Surgery

## 2014-08-28 DIAGNOSIS — D125 Benign neoplasm of sigmoid colon: Secondary | ICD-10-CM | POA: Diagnosis not present

## 2014-08-28 DIAGNOSIS — K5730 Diverticulosis of large intestine without perforation or abscess without bleeding: Secondary | ICD-10-CM | POA: Diagnosis not present

## 2014-08-28 DIAGNOSIS — Z1211 Encounter for screening for malignant neoplasm of colon: Secondary | ICD-10-CM | POA: Diagnosis not present

## 2014-08-28 DIAGNOSIS — I10 Essential (primary) hypertension: Secondary | ICD-10-CM | POA: Insufficient documentation

## 2014-08-28 HISTORY — PX: COLONOSCOPY: SHX5424

## 2014-08-28 HISTORY — DX: Essential (primary) hypertension: I10

## 2014-08-28 HISTORY — DX: Malignant (primary) neoplasm, unspecified: C80.1

## 2014-08-28 SURGERY — COLONOSCOPY
Anesthesia: Moderate Sedation

## 2014-08-28 MED ORDER — ONDANSETRON HCL 4 MG/2ML IJ SOLN
INTRAMUSCULAR | Status: AC
  Filled 2014-08-28: qty 2

## 2014-08-28 MED ORDER — MIDAZOLAM HCL 5 MG/5ML IJ SOLN
INTRAMUSCULAR | Status: DC | PRN
  Administered 2014-08-28: 3 mg via INTRAVENOUS

## 2014-08-28 MED ORDER — STERILE WATER FOR IRRIGATION IR SOLN
Status: DC | PRN
  Administered 2014-08-28: 08:00:00

## 2014-08-28 MED ORDER — MEPERIDINE HCL 50 MG/ML IJ SOLN
INTRAMUSCULAR | Status: AC
  Filled 2014-08-28: qty 1

## 2014-08-28 MED ORDER — MIDAZOLAM HCL 5 MG/5ML IJ SOLN
INTRAMUSCULAR | Status: AC
  Filled 2014-08-28: qty 5

## 2014-08-28 MED ORDER — SODIUM CHLORIDE 0.9 % IV SOLN
INTRAVENOUS | Status: DC
  Administered 2014-08-28: 07:00:00 via INTRAVENOUS

## 2014-08-28 MED ORDER — ONDANSETRON HCL 4 MG/2ML IJ SOLN
4.0000 mg | Freq: Once | INTRAMUSCULAR | Status: AC
  Administered 2014-08-28: 4 mg via INTRAVENOUS

## 2014-08-28 MED ORDER — MEPERIDINE HCL 50 MG/ML IJ SOLN
INTRAMUSCULAR | Status: DC | PRN
  Administered 2014-08-28: 50 mg via INTRAVENOUS

## 2014-08-28 NOTE — Interval H&P Note (Signed)
History and Physical Interval Note:  08/28/2014 7:22 AM  [name]  has presented today for surgery, with the diagnosis of screening  The various methods of treatment have been discussed with the patient and family. After consideration of risks, benefits and other options for treatment, the patient has consented to  Procedure(s): COLONOSCOPY (N/A) as a surgical intervention .  The patient's history has been reviewed, patient examined, no change in status, stable for surgery.  I have reviewed the patient's chart and labs.  Questions were answered to the patient's satisfaction.     Aviva Signs A

## 2014-08-28 NOTE — Discharge Instructions (Signed)
Diverticulosis °Diverticulosis is the condition that develops when small pouches (diverticula) form in the wall of your colon. Your colon, or large intestine, is where water is absorbed and stool is formed. The pouches form when the inside layer of your colon pushes through weak spots in the outer layers of your colon. °CAUSES  °No one knows exactly what causes diverticulosis. °RISK FACTORS °· Being older than 50. Your risk for this condition increases with age. Diverticulosis is rare in people younger than 40 years. By age 80, almost everyone has it. °· Eating a low-fiber diet. °· Being frequently constipated. °· Being overweight. °· Not getting enough exercise. °· Smoking. °· Taking over-the-counter pain medicines, like aspirin and ibuprofen. °SYMPTOMS  °Most people with diverticulosis do not have symptoms. °DIAGNOSIS  °Because diverticulosis often has no symptoms, health care providers often discover the condition during an exam for other colon problems. In many cases, a health care provider will diagnose diverticulosis while using a flexible scope to examine the colon (colonoscopy). °TREATMENT  °If you have never developed an infection related to diverticulosis, you may not need treatment. If you have had an infection before, treatment may include: °· Eating more fruits, vegetables, and grains. °· Taking a fiber supplement. °· Taking a live bacteria supplement (probiotic). °· Taking medicine to relax your colon. °HOME CARE INSTRUCTIONS  °· Drink at least 6-8 glasses of water each day to prevent constipation. °· Try not to strain when you have a bowel movement. °· Keep all follow-up appointments. °If you have had an infection before:  °· Increase the fiber in your diet as directed by your health care provider or dietitian. °· Take a dietary fiber supplement if your health care provider approves. °· Only take medicines as directed by your health care provider. °SEEK MEDICAL CARE IF:  °· You have abdominal  pain. °· You have bloating. °· You have cramps. °· You have not gone to the bathroom in 3 days. °SEEK IMMEDIATE MEDICAL CARE IF:  °· Your pain gets worse. °· Your bloating becomes very bad. °· You have a fever or chills, and your symptoms suddenly get worse. °· You begin vomiting. °· You have bowel movements that are bloody or black. °MAKE SURE YOU: °· Understand these instructions. °· Will watch your condition. °· Will get help right away if you are not doing well or get worse. °Document Released: 10/24/2003 Document Revised: 01/31/2013 Document Reviewed: 12/21/2012 °ExitCare® Patient Information ©2015 [employer]. This information is not intended to replace advice given to you by your health care provider. Make sure you discuss any questions you have with your health care provider. °Colonoscopy, Care After °Refer to this sheet in the next few weeks. These instructions provide you with information on caring for yourself after your procedure. Your health care provider may also give you more specific instructions. Your treatment has been planned according to current medical practices, but problems sometimes occur. Call your health care provider if you have any problems or questions after your procedure. °WHAT TO EXPECT AFTER THE PROCEDURE  °After your procedure, it is typical to have the following: °· A small amount of blood in your stool. °· Moderate amounts of gas and mild abdominal cramping or bloating. °HOME CARE INSTRUCTIONS °· Do not drive, operate machinery, or sign important documents for 24 hours. °· You may shower and resume your regular physical activities, but move at a slower pace for the first 24 hours. °· Take frequent rest periods for the first 24 hours. °· Walk   around or put a warm pack on your abdomen to help reduce abdominal cramping and bloating. °· Drink enough fluids to keep your urine clear or pale yellow. °· You may resume your normal diet as instructed by your health care provider. Avoid  heavy or fried foods that are hard to digest. °· Avoid drinking alcohol for 24 hours or as instructed by your health care provider. °· Only take over-the-counter or prescription medicines as directed by your health care provider. °· If a tissue sample (biopsy) was taken during your procedure: °¨ Do not take aspirin or blood thinners for 7 days, or as instructed by your health care provider. °¨ Do not drink alcohol for 7 days, or as instructed by your health care provider. °¨ Eat soft foods for the first 24 hours. °SEEK MEDICAL CARE IF: °You have persistent spotting of blood in your stool 2-3 days after the procedure. °SEEK IMMEDIATE MEDICAL CARE IF: °· You have more than a small spotting of blood in your stool. °· You pass large blood clots in your stool. °· Your abdomen is swollen (distended). °· You have nausea or vomiting. °· You have a fever. °· You have increasing abdominal pain that is not relieved with medicine. °Document Released: 09/10/2003 Document Revised: 11/16/2012 Document Reviewed: 10/03/2012 °ExitCare® Patient Information ©2015 [employer]. This information is not intended to replace advice given to you by your health care provider. Make sure you discuss any questions you have with your health care provider. ° °

## 2014-08-28 NOTE — Op Note (Signed)
[place], [id number]   COLONOSCOPY PROCEDURE REPORT     EXAM DATE: 30-Aug-2014  PATIENT NAME:      [name], [name]           MR #: [id number] BIRTHDATE:       Jul 19, 1945      VISIT #:     [phone]  ATTENDING:     Aviva Signs, MD     STATUS:     outpatient ASSISTANT:  INDICATIONS:  The patient is a 69 yr old male here for a colonoscopy due to average risk patient for colon cancer. PROCEDURE PERFORMED:     Colonoscopy with snare polypectomy MEDICATIONS:     Demerol 50 mg IV, Versed 3 mg IV, and Zofran 4mg  iv  ESTIMATED BLOOD LOSS:     None  CONSENT: The patient understands the risks and benefits of the procedure and understands that these risks include, but are not limited to: sedation, allergic reaction, infection, perforation and/or bleeding. Alternative means of evaluation and treatment include, among others: physical exam, x-rays, and/or surgical intervention. The patient elects to proceed with this endoscopic procedure.  DESCRIPTION OF PROCEDURE: During intra-op preparation period all mechanical & medical equipment was checked for proper function. Hand hygiene and appropriate measures for infection prevention was taken. After the risks, benefits and alternatives of the procedure were thoroughly explained, Informed consent was verified, confirmed and timeout was successfully executed by the treatment team. A digital exam revealed no abnormalities of the rectum. The EC-[id number] ([id number]) endoscope was introduced through the anus and advanced to the cecum, which was identified by both the appendix and ileocecal valve. adequate (Trilyte was used) The instrument was then slowly withdrawn as the colon was fully examined.Estimated blood loss is zero unless otherwise noted in this procedure report.   COLON FINDINGS: A smooth sessile polyp measuring 3 mm in size was found in the distal sigmoid colon.  A polypectomy was  performed using snare cautery.  The resection was complete but the polyp tissue was not retrieved.   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  The opening was medium sized.   The examination was otherwise normal. Retroflexed views revealed no abnormalities. The scope was then completely withdrawn from the patient and the procedure terminated. SCOPE WITHDRAWAL TIME: 8    ADVERSE EVENTS:      There were no immediate complications.  IMPRESSIONS:     1.  Sessile polyp was found in the distal sigmoid colon; polypectomy was performed using snare cautery 2.  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  The examination was otherwise normal  RECOMMENDATIONS:     Repeat Colonoscopy in 5 years. RECALL:  _____________________________ Aviva Signs, MD eSigned:  Aviva Signs, MD 2014-08-30 7:48 AM   cc:   CPT CODES: ICD CODES:  The ICD and CPT codes recommended by this software are interpretations from the data that the clinical staff has captured with the software.  The verification of the translation of this report to the ICD and CPT codes and modifiers is the sole responsibility of the health care institution and practicing physician where this report was generated.  [employer] will not be held responsible for the validity of the ICD and CPT codes included on this report.  AMA assumes no liability for data contained or not contained herein. [employer] is a Designer, television/film set of the [employer].   PATIENT NAME:  [name], [name] MR#: [id number]

## 2014-08-29 ENCOUNTER — Encounter (HOSPITAL_COMMUNITY): Payer: Self-pay | Admitting: General Surgery

## 2014-10-04 DIAGNOSIS — C61 Malignant neoplasm of prostate: Secondary | ICD-10-CM | POA: Diagnosis not present

## 2014-10-11 DIAGNOSIS — C61 Malignant neoplasm of prostate: Secondary | ICD-10-CM | POA: Diagnosis not present

## 2015-01-02 DIAGNOSIS — C61 Malignant neoplasm of prostate: Secondary | ICD-10-CM | POA: Diagnosis not present

## 2015-01-02 DIAGNOSIS — Z6833 Body mass index (BMI) 33.0-33.9, adult: Secondary | ICD-10-CM | POA: Diagnosis not present

## 2015-01-02 DIAGNOSIS — E6609 Other obesity due to excess calories: Secondary | ICD-10-CM | POA: Diagnosis not present

## 2015-01-02 DIAGNOSIS — I10 Essential (primary) hypertension: Secondary | ICD-10-CM | POA: Diagnosis not present

## 2015-01-02 DIAGNOSIS — Z1389 Encounter for screening for other disorder: Secondary | ICD-10-CM | POA: Diagnosis not present

## 2015-01-02 DIAGNOSIS — E782 Mixed hyperlipidemia: Secondary | ICD-10-CM | POA: Diagnosis not present

## 2015-01-08 DIAGNOSIS — E782 Mixed hyperlipidemia: Secondary | ICD-10-CM | POA: Diagnosis not present

## 2015-01-08 DIAGNOSIS — I10 Essential (primary) hypertension: Secondary | ICD-10-CM | POA: Diagnosis not present

## 2015-01-08 DIAGNOSIS — Z1389 Encounter for screening for other disorder: Secondary | ICD-10-CM | POA: Diagnosis not present

## 2015-01-08 DIAGNOSIS — Z6833 Body mass index (BMI) 33.0-33.9, adult: Secondary | ICD-10-CM | POA: Diagnosis not present

## 2015-04-17 DIAGNOSIS — L570 Actinic keratosis: Secondary | ICD-10-CM | POA: Diagnosis not present

## 2015-04-17 DIAGNOSIS — D485 Neoplasm of uncertain behavior of skin: Secondary | ICD-10-CM | POA: Diagnosis not present

## 2015-10-10 DIAGNOSIS — C61 Malignant neoplasm of prostate: Secondary | ICD-10-CM | POA: Diagnosis not present

## 2015-10-22 DIAGNOSIS — L570 Actinic keratosis: Secondary | ICD-10-CM | POA: Diagnosis not present

## 2015-10-22 DIAGNOSIS — L821 Other seborrheic keratosis: Secondary | ICD-10-CM | POA: Diagnosis not present

## 2015-10-22 DIAGNOSIS — C44622 Squamous cell carcinoma of skin of right upper limb, including shoulder: Secondary | ICD-10-CM | POA: Diagnosis not present

## 2015-10-22 DIAGNOSIS — D485 Neoplasm of uncertain behavior of skin: Secondary | ICD-10-CM | POA: Diagnosis not present

## 2015-11-07 DIAGNOSIS — C44529 Squamous cell carcinoma of skin of other part of trunk: Secondary | ICD-10-CM | POA: Diagnosis not present

## 2015-12-23 DIAGNOSIS — I10 Essential (primary) hypertension: Secondary | ICD-10-CM | POA: Diagnosis not present

## 2015-12-23 DIAGNOSIS — E782 Mixed hyperlipidemia: Secondary | ICD-10-CM | POA: Diagnosis not present

## 2015-12-23 DIAGNOSIS — Z1389 Encounter for screening for other disorder: Secondary | ICD-10-CM | POA: Diagnosis not present

## 2015-12-23 DIAGNOSIS — R7309 Other abnormal glucose: Secondary | ICD-10-CM | POA: Diagnosis not present

## 2015-12-23 DIAGNOSIS — R011 Cardiac murmur, unspecified: Secondary | ICD-10-CM | POA: Diagnosis not present

## 2015-12-23 DIAGNOSIS — Z0001 Encounter for general adult medical examination with abnormal findings: Secondary | ICD-10-CM | POA: Diagnosis not present

## 2015-12-24 DIAGNOSIS — K7689 Other specified diseases of liver: Secondary | ICD-10-CM | POA: Diagnosis not present

## 2016-04-01 DIAGNOSIS — R42 Dizziness and giddiness: Secondary | ICD-10-CM | POA: Diagnosis not present

## 2016-07-01 DIAGNOSIS — L570 Actinic keratosis: Secondary | ICD-10-CM | POA: Diagnosis not present

## 2016-07-01 DIAGNOSIS — D485 Neoplasm of uncertain behavior of skin: Secondary | ICD-10-CM | POA: Diagnosis not present

## 2016-07-01 DIAGNOSIS — Z85828 Personal history of other malignant neoplasm of skin: Secondary | ICD-10-CM | POA: Diagnosis not present

## 2016-10-13 DIAGNOSIS — Z08 Encounter for follow-up examination after completed treatment for malignant neoplasm: Secondary | ICD-10-CM | POA: Diagnosis not present

## 2016-10-13 DIAGNOSIS — C61 Malignant neoplasm of prostate: Secondary | ICD-10-CM | POA: Diagnosis not present

## 2017-02-05 DIAGNOSIS — Z0001 Encounter for general adult medical examination with abnormal findings: Secondary | ICD-10-CM | POA: Diagnosis not present

## 2017-02-05 DIAGNOSIS — E782 Mixed hyperlipidemia: Secondary | ICD-10-CM | POA: Diagnosis not present

## 2017-02-05 DIAGNOSIS — R7309 Other abnormal glucose: Secondary | ICD-10-CM | POA: Diagnosis not present

## 2017-02-05 DIAGNOSIS — C61 Malignant neoplasm of prostate: Secondary | ICD-10-CM | POA: Diagnosis not present

## 2017-02-05 DIAGNOSIS — I10 Essential (primary) hypertension: Secondary | ICD-10-CM | POA: Diagnosis not present

## 2017-02-05 DIAGNOSIS — Z1389 Encounter for screening for other disorder: Secondary | ICD-10-CM | POA: Diagnosis not present

## 2017-11-02 DIAGNOSIS — E782 Mixed hyperlipidemia: Secondary | ICD-10-CM | POA: Diagnosis not present

## 2017-11-02 DIAGNOSIS — I10 Essential (primary) hypertension: Secondary | ICD-10-CM | POA: Diagnosis not present

## 2017-11-02 DIAGNOSIS — R7309 Other abnormal glucose: Secondary | ICD-10-CM | POA: Diagnosis not present

## 2017-11-02 DIAGNOSIS — R9431 Abnormal electrocardiogram [ECG] [EKG]: Secondary | ICD-10-CM | POA: Diagnosis not present

## 2017-11-02 DIAGNOSIS — Z1389 Encounter for screening for other disorder: Secondary | ICD-10-CM | POA: Diagnosis not present

## 2017-11-03 ENCOUNTER — Observation Stay (HOSPITAL_COMMUNITY)
Admission: EM | Admit: 2017-11-03 | Discharge: 2017-11-05 | Disposition: A | Discharge status: Home / Self Care | Payer: Medicare Other | Attending: Cardiovascular Disease | Admitting: Cardiovascular Disease

## 2017-11-03 ENCOUNTER — Other Ambulatory Visit: Payer: Self-pay

## 2017-11-03 ENCOUNTER — Emergency Department (HOSPITAL_COMMUNITY): Payer: Medicare Other

## 2017-11-03 ENCOUNTER — Observation Stay (HOSPITAL_COMMUNITY): Payer: Medicare Other

## 2017-11-03 ENCOUNTER — Encounter (HOSPITAL_COMMUNITY): Payer: Self-pay

## 2017-11-03 DIAGNOSIS — Z8546 Personal history of malignant neoplasm of prostate: Secondary | ICD-10-CM | POA: Insufficient documentation

## 2017-11-03 DIAGNOSIS — I2510 Atherosclerotic heart disease of native coronary artery without angina pectoris: Secondary | ICD-10-CM | POA: Diagnosis not present

## 2017-11-03 DIAGNOSIS — I110 Hypertensive heart disease with heart failure: Secondary | ICD-10-CM | POA: Insufficient documentation

## 2017-11-03 DIAGNOSIS — I259 Chronic ischemic heart disease, unspecified: Secondary | ICD-10-CM | POA: Diagnosis not present

## 2017-11-03 DIAGNOSIS — I498 Other specified cardiac arrhythmias: Secondary | ICD-10-CM

## 2017-11-03 DIAGNOSIS — R7989 Other specified abnormal findings of blood chemistry: Secondary | ICD-10-CM

## 2017-11-03 DIAGNOSIS — I080 Rheumatic disorders of both mitral and aortic valves: Secondary | ICD-10-CM | POA: Diagnosis not present

## 2017-11-03 DIAGNOSIS — I5030 Unspecified diastolic (congestive) heart failure: Secondary | ICD-10-CM | POA: Insufficient documentation

## 2017-11-03 DIAGNOSIS — Z880 Allergy status to penicillin: Secondary | ICD-10-CM | POA: Diagnosis not present

## 2017-11-03 DIAGNOSIS — Z7982 Long term (current) use of aspirin: Secondary | ICD-10-CM | POA: Insufficient documentation

## 2017-11-03 DIAGNOSIS — I420 Dilated cardiomyopathy: Secondary | ICD-10-CM | POA: Insufficient documentation

## 2017-11-03 DIAGNOSIS — R0789 Other chest pain: Secondary | ICD-10-CM | POA: Diagnosis not present

## 2017-11-03 DIAGNOSIS — R079 Chest pain, unspecified: Secondary | ICD-10-CM | POA: Diagnosis not present

## 2017-11-03 DIAGNOSIS — Z9889 Other specified postprocedural states: Secondary | ICD-10-CM | POA: Insufficient documentation

## 2017-11-03 DIAGNOSIS — I10 Essential (primary) hypertension: Secondary | ICD-10-CM

## 2017-11-03 DIAGNOSIS — Z87891 Personal history of nicotine dependence: Secondary | ICD-10-CM | POA: Insufficient documentation

## 2017-11-03 DIAGNOSIS — E785 Hyperlipidemia, unspecified: Secondary | ICD-10-CM

## 2017-11-03 DIAGNOSIS — R001 Bradycardia, unspecified: Secondary | ICD-10-CM | POA: Insufficient documentation

## 2017-11-03 DIAGNOSIS — R778 Other specified abnormalities of plasma proteins: Secondary | ICD-10-CM

## 2017-11-03 DIAGNOSIS — Z79899 Other long term (current) drug therapy: Secondary | ICD-10-CM | POA: Diagnosis not present

## 2017-11-03 DIAGNOSIS — I200 Unstable angina: Secondary | ICD-10-CM

## 2017-11-03 LAB — CBC WITH DIFFERENTIAL/PLATELET
Basophils Absolute: 0.1 10*3/uL (ref 0.0–0.1)
Basophils Relative: 1 %
EOS ABS: 0.3 10*3/uL (ref 0.0–0.7)
EOS PCT: 4 %
HCT: 39.1 % (ref 39.0–52.0)
Hemoglobin: 14 g/dL (ref 13.0–17.0)
LYMPHS ABS: 1.4 10*3/uL (ref 0.7–4.0)
Lymphocytes Relative: 19 %
MCH: 33.6 pg (ref 26.0–34.0)
MCHC: 35.8 g/dL (ref 30.0–36.0)
MCV: 93.8 fL (ref 78.0–100.0)
MONO ABS: 0.8 10*3/uL (ref 0.1–1.0)
MONOS PCT: 11 %
Neutro Abs: 4.7 10*3/uL (ref 1.7–7.7)
Neutrophils Relative %: 65 %
Platelets: 220 10*3/uL (ref 150–400)
RBC: 4.17 MIL/uL — ABNORMAL LOW (ref 4.22–5.81)
RDW: 12.9 % (ref 11.5–15.5)
WBC: 7.2 10*3/uL (ref 4.0–10.5)

## 2017-11-03 LAB — BASIC METABOLIC PANEL
Anion gap: 9 (ref 5–15)
BUN: 18 mg/dL (ref 8–23)
CALCIUM: 9.1 mg/dL (ref 8.9–10.3)
CHLORIDE: 106 mmol/L (ref 98–111)
CO2: 21 mmol/L — ABNORMAL LOW (ref 22–32)
CREATININE: 0.93 mg/dL (ref 0.61–1.24)
GFR calc Af Amer: >60 mL/min (ref >60)
GFR calc non Af Amer: >60 mL/min (ref >60)
Glucose, Bld: 87 mg/dL (ref 70–99)
Potassium: 3.9 mmol/L (ref 3.5–5.1)
SODIUM: 136 mmol/L (ref 135–145)

## 2017-11-03 LAB — HEMOGLOBIN A1C
Hgb A1c MFr Bld: 5.8 % — ABNORMAL HIGH (ref 4.8–5.6)
Mean Plasma Glucose: 119.76 mg/dL

## 2017-11-03 LAB — TROPONIN I
TROPONIN I: 0.03 ng/mL — AB (ref <0.03)
Troponin I: 0.03 ng/mL (ref <0.03)
Troponin I: 0.03 ng/mL (ref <0.03)

## 2017-11-03 MED ORDER — METOPROLOL TARTRATE 25 MG PO TABS
12.5000 mg | ORAL_TABLET | Freq: Two times a day (BID) | ORAL | Status: DC
  Administered 2017-11-03: 12.5 mg via ORAL
  Filled 2017-11-03: qty 1

## 2017-11-03 MED ORDER — ENOXAPARIN SODIUM 40 MG/0.4ML ~~LOC~~ SOLN
40.0000 mg | SUBCUTANEOUS | Status: DC
  Administered 2017-11-03: 40 mg via SUBCUTANEOUS
  Filled 2017-11-03: qty 0.4

## 2017-11-03 MED ORDER — ASPIRIN EC 81 MG PO TBEC
81.0000 mg | DELAYED_RELEASE_TABLET | Freq: Every day | ORAL | Status: DC
  Administered 2017-11-05: 11:00:00 81 mg via ORAL
  Filled 2017-11-03: qty 1

## 2017-11-03 MED ORDER — ASPIRIN 81 MG PO CHEW
324.0000 mg | CHEWABLE_TABLET | Freq: Once | ORAL | Status: AC
  Administered 2017-11-03: 324 mg via ORAL
  Filled 2017-11-03: qty 4

## 2017-11-03 MED ORDER — ACETAMINOPHEN 325 MG PO TABS
650.0000 mg | ORAL_TABLET | Freq: Four times a day (QID) | ORAL | Status: DC | PRN
  Administered 2017-11-04: 650 mg via ORAL
  Filled 2017-11-03: qty 2

## 2017-11-03 MED ORDER — METOPROLOL TARTRATE 25 MG PO TABS: 25.0000 mg | ORAL_TABLET | Freq: Two times a day (BID) | ORAL | Status: DC

## 2017-11-03 MED ORDER — ALBUTEROL SULFATE (2.5 MG/3ML) 0.083% IN NEBU: 2.5000 mg | INHALATION_SOLUTION | RESPIRATORY_TRACT | Status: DC | PRN

## 2017-11-03 MED ORDER — ONDANSETRON HCL 4 MG/2ML IJ SOLN
4.0000 mg | Freq: Four times a day (QID) | INTRAMUSCULAR | Status: DC | PRN
  Administered 2017-11-04: 23:00:00 4 mg via INTRAVENOUS
  Filled 2017-11-03: qty 2

## 2017-11-03 MED ORDER — SODIUM CHLORIDE 0.9% FLUSH
3.0000 mL | Freq: Two times a day (BID) | INTRAVENOUS | Status: DC
  Administered 2017-11-03: 3 mL via INTRAVENOUS

## 2017-11-03 MED ORDER — SENNOSIDES-DOCUSATE SODIUM 8.6-50 MG PO TABS
1.0000 | ORAL_TABLET | Freq: Every evening | ORAL | Status: DC | PRN
  Filled 2017-11-03: qty 1

## 2017-11-03 MED ORDER — LOSARTAN POTASSIUM-HCTZ 100-25 MG PO TABS: 1.0000 | ORAL_TABLET | Freq: Every day | ORAL | Status: DC

## 2017-11-03 MED ORDER — ONDANSETRON HCL 4 MG PO TABS: 4.0000 mg | ORAL_TABLET | Freq: Four times a day (QID) | ORAL | Status: DC | PRN

## 2017-11-03 MED ORDER — PNEUMOCOCCAL VAC POLYVALENT 25 MCG/0.5ML IJ INJ
0.5000 mL | INJECTION | INTRAMUSCULAR | Status: DC
  Filled 2017-11-03: qty 0.5

## 2017-11-03 MED ORDER — HYDROCHLOROTHIAZIDE 25 MG PO TABS
25.0000 mg | ORAL_TABLET | Freq: Every day | ORAL | Status: DC
  Administered 2017-11-04: 25 mg via ORAL
  Filled 2017-11-03: qty 1

## 2017-11-03 MED ORDER — ACETAMINOPHEN 650 MG RE SUPP: 650.0000 mg | Freq: Four times a day (QID) | RECTAL | Status: DC | PRN

## 2017-11-03 MED ORDER — LOSARTAN POTASSIUM 50 MG PO TABS
100.0000 mg | ORAL_TABLET | Freq: Every day | ORAL | Status: DC
  Administered 2017-11-04 – 2017-11-05 (×2): 100 mg via ORAL
  Filled 2017-11-03 (×3): qty 2

## 2017-11-03 NOTE — ED Triage Notes (Signed)
Pt reports intermittent chest pressure x 3 weeks.  Pt had blood work done at [facility] office yesterday and was told today his troponin was elevated.  Pt denies any pain at this time.   Reports has had a cold for the past 3 weeks so says he, "feels terrible."

## 2017-11-03 NOTE — ED Notes (Signed)
CRITICAL VALUE ALERT  Critical Value: tropoinin 0.03  Date & Time Notied:  11/03/17 1357  Provider Notified: dr Rogene Houston  Orders Received/Actions taken:

## 2017-11-03 NOTE — ED Provider Notes (Signed)
[facility] EMERGENCY DEPARTMENT Provider Note   CSN: [id number] Arrival date & time: 11/03/17  1115     History   Chief Complaint Chief Complaint  Patient presents with  . Chest Pain    HPI [name] is a 72 y.o. male.  Patient sent in from primary care office with concerns for elevated troponin.  Patient was seen by them yesterday had a troponin level ordered and it came back at 0.1.  Patient went there because he had not been feeling well for a couple months.  But mostly because he had like an upper respiratory infection for the past few weeks.  But overall starting to feel better from that.  Patient has had a good history of exertional chest pain or chest pressure when he walks up a hill.  And has to rest and then it goes away.  This is been ongoing for 2 months.  Patient has not had any of that occur in the past week.  No chest pain no pressure.  No nausea vomiting no diaphoresis.  No fevers recently.  Oxygen saturation here 98% on room air.  Patient's past medical history is negative for any known coronary disease.  Does have a history of hypertension.  And prostate cancer.     Past Medical History:  Diagnosis Date  . Cancer ([place]) 07/2012   prostate  . Hypertension     Patient Active Problem List   Diagnosis Date Noted  . 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 Pain in joint, shoulder region 05/15/2011  . Muscle weakness (generalized) 05/15/2011    Past Surgical History:  Procedure Laterality Date  . COLONOSCOPY N/A 08/28/2014   Procedure: COLONOSCOPY;  Surgeon: Aviva Signs, MD;  Location: [facility];  Service: Gastroenterology;  Laterality: N/A;  . PROSTATE SURGERY  12/2012  . SHOULDER SURGERY Bilateral         Home Medications    Prior to Admission medications   Medication Sig Start Date End Date Taking? Authorizing Provider  aspirin 81 MG tablet Take 81 mg by mouth daily.   Yes [provider]    losartan-hydrochlorothiazide (HYZAAR) 100-25 MG per tablet Take 1 tablet by mouth daily.   Yes [provider]  metoprolol tartrate (LOPRESSOR) 25 MG tablet Take 1 tablet by mouth 2 (two) times daily. 11/02/17  Yes [provider]  Omega-3 1000 MG CAPS Take 1 capsule by mouth daily as needed.    Yes [provider]    Family History Family History  Problem Relation Age of Onset  . Heart attack Father   . Kidney disease Brother     Social History Social History   Tobacco Use  . Smoking status: Former Smoker    Packs/day: 0.25    Years: 18.00    Pack years: 4.50    Types: Cigarettes  . Smokeless tobacco: Former Systems developer    Quit date: 02/09/1979  Substance Use Topics  . Alcohol use: No  . Drug use: No     Allergies   Penicillins   Review of Systems Review of Systems  Constitutional: Negative for fever.  HENT: Positive for congestion. Negative for sore throat.   Eyes: Negative for redness.  Respiratory: Positive for cough. Negative for shortness of breath.   Cardiovascular: Positive for chest pain.  Gastrointestinal: Negative for abdominal pain.  Genitourinary: Negative for dysuria.  Musculoskeletal: Negative for back pain.  Skin: Negative for rash.  Neurological: Negative for headaches.  Hematological: Does not  bruise/bleed easily.     Physical Exam Updated Vital Signs BP (!) 170/84   Pulse (!) 59   Temp 98.4 F (36.9 C) (Oral)   Resp (!) 24   Ht 1.676 m (5\' 6" )   Wt 96.2 kg   SpO2 95%   BMI 34.22 kg/m   Physical Exam  Constitutional: He is oriented to person, place, and time. He appears well-developed and well-nourished. No distress.  HENT:  Head: Normocephalic and atraumatic.  Mouth/Throat: Oropharynx is clear and moist.  Eyes: Pupils are equal, round, and reactive to light. Conjunctivae and EOM are normal.  Neck: Normal range of motion. Neck supple.  Cardiovascular: Normal rate, regular rhythm and normal heart sounds.   Pulmonary/Chest: Effort normal and breath sounds normal. No respiratory distress.  Abdominal: Soft. Bowel sounds are normal.  Musculoskeletal: Normal range of motion. He exhibits no edema.  Neurological: He is alert and oriented to person, place, and time. No cranial nerve deficit or sensory deficit. He exhibits normal muscle tone. Coordination normal.  Skin: Skin is warm. No rash noted.  Nursing note and vitals reviewed.    ED Treatments / Results  Labs (all labs ordered are listed, but only abnormal results are displayed) Labs Reviewed  CBC WITH DIFFERENTIAL/PLATELET - Abnormal; Notable for the following components:      Result Value   RBC 4.17 (*)    All other components within normal limits  BASIC METABOLIC PANEL - Abnormal; Notable for the following components:   CO2 21 (*)    All other components within normal limits  TROPONIN I - Abnormal; Notable for the following components:   Troponin I 0.03 (*)    All other components within normal limits    EKG EKG Interpretation  Date/Time:  Wednesday November 03 2017 11:26:14 EDT Ventricular Rate:  58 PR Interval:  146 QRS Duration: 92 QT Interval:  424 QTC Calculation: 416 R Axis:   -3 Text Interpretation:  Sinus bradycardia Possible Left atrial enlargement Left ventricular hypertrophy with repolarization abnormality Abnormal ECG Confirmed by Fredia Sorrow [phone]) on 11/03/2017 12:50:49 PM   Radiology Dg Chest 2 View  Result Date: 11/03/2017 CLINICAL DATA:  Intermittent chest pressure EXAM: CHEST - 2 VIEW COMPARISON:  11/30/2008 FINDINGS: The heart size and mediastinal contours are within normal limits. Both lungs are clear. The visualized skeletal structures are unremarkable. IMPRESSION: No acute abnormality noted. Electronically Signed   By: Inez Catalina M.D.   On: 11/03/2017 12:10    Procedures Procedures (including critical care time)  Medications Ordered in ED Medications  aspirin chewable tablet 324 mg (324  mg Oral Given 11/03/17 1442)     Initial Impression / Assessment and Plan / ED Course  I have reviewed the triage vital signs and the nursing notes.  Pertinent labs & imaging results that were available during my care of the patient were reviewed by me and considered in my medical decision making (see chart for details).     Patient has been asymptomatic as far as the chest pressure goes for the past week.  Also no chest pain.  EKG without any acute changes.  Troponin here came back at 0.03 so just barely positive.  This was a regular troponin.  Chest x-ray negative other labs without significant abnormalities.  Patient given baby aspirin here.  Discussed with cardiology they agree that is a good story for exertional angina.  Patient will be admitted here they want him n.p.o. after midnight serial enzymes.  They may consider  transferring for cardiac cath tomorrow.  But due to the history of the upper respiratory infection were considering doing echocardiogram as well.  They will see the patient in consultation.  Discussed with the hospitalist they will admit.  In addition cardiology said that patient does not need to be started on heparin unless the troponins go up or the chest pain recurs.  Final Clinical Impressions(s) / ED Diagnoses   Final diagnoses:  Elevated troponin    ED Discharge Orders    None       Fredia Sorrow, MD 11/03/17 [phone]

## 2017-11-03 NOTE — ED Notes (Signed)
ED Provider at bedside. 

## 2017-11-03 NOTE — H&P (Signed)
History and Physical  [name] JJK:[id number] DOB: May 02, 1945 DOA: 11/03/2017  Referring physician: EDP PCP: Sharilyn Sites, MD  Patient coming from: Home  Chief Complaint: Chest pain  HPI: [name] is a 72 y.o. male with medical history significant for HTN, prostate CA presents to the ED after being instructed by his PCP to come in due to elevated troponin.  Patient reported having a cold about 3 weeks ago, which has currently resolved.  During this time, patient also noted some chest pain/pressure on exertion with associated shortness of breath.  Reported chest pain as substernal, denies any radiation, intermittent, and goes away with rest.  Last episode of chest pain was about a week ago.  Troponin drawn in PCP office on 11/02/2017 showed 0.10.  Patient advised to come to the ER.  Denies any current chest pain, shortness of breath, fever/chills, cough, abdominal pain, diaphoresis, dizziness, nausea/vomiting, diarrhea.  No history of CAD.  ED Course: In the ED, repeat troponin was 0.03, EKG showed sinus bradycardia.  Cardiology consulted by EDP, requests admission for observation and further work-up.  Review of Systems: Review of systems are otherwise negative   Past Medical History:  Diagnosis Date  . Cancer ([place]) 07/2012   prostate  . Hypertension    Past Surgical History:  Procedure Laterality Date  . COLONOSCOPY N/A 08/28/2014   Procedure: COLONOSCOPY;  Surgeon: Aviva Signs, MD;  Location: [facility];  Service: Gastroenterology;  Laterality: N/A;  . PROSTATE SURGERY  12/2012  . SHOULDER SURGERY Bilateral     Social History:  reports that he has quit smoking. His smoking use included cigarettes. He has a 4.50 pack-year smoking history. He quit smokeless tobacco use about 38 years ago. He reports that he does not drink alcohol or use drugs.   Allergies  Allergen Reactions  . Penicillins     Eye swelled shut  .Has patient had a PCN reaction causing  immediate rash, facial/tongue/throat swelling, SOB or lightheadedness with hypotension: Yes Has patient had a PCN reaction causing severe rash involving mucus membranes or skin necrosis: No Has patient had a PCN reaction that required hospitalization: No Has patient had a PCN reaction occurring within the last 10 years: No If all of the above answers are "NO", then may proceed with Cephalosporin use    Family History  Problem Relation Age of Onset  . Heart attack Father   . Kidney disease Brother       Prior to Admission medications   Medication Sig Start Date End Date Taking? Authorizing Provider  aspirin 81 MG tablet Take 81 mg by mouth daily.   Yes [provider]  losartan-hydrochlorothiazide (HYZAAR) 100-25 MG per tablet Take 1 tablet by mouth daily.   Yes [provider]  metoprolol tartrate (LOPRESSOR) 25 MG tablet Take 1 tablet by mouth 2 (two) times daily. 11/02/17  Yes [provider]  Omega-3 1000 MG CAPS Take 1 capsule by mouth daily as needed.    Yes [provider]    Physical Exam: BP (!) 160/75   Pulse (!) 51   Temp 98.4 F (36.9 C) (Oral)   Resp 17   Ht 5\' 6"  (1.676 m)   Wt 96.2 kg   SpO2 95%   BMI 34.22 kg/m   General: NAD Eyes: Normal ENT: Normal Neck: Supple Cardiovascular: S1, S2 present Respiratory: CTA B Abdomen: Soft, nontender, nondistended, bowel sounds present Skin: Normal Musculoskeletal: No pedal edema bilaterally Psychiatric: Normal mood Neurologic: No focal  neurologic deficits noted          Labs on Admission:  Basic Metabolic Panel: Recent Labs  Lab 11/03/17 1225  NA 136  K 3.9  CL 106  CO2 21*  GLUCOSE 87  BUN 18  CREATININE 0.93  CALCIUM 9.1   Liver Function Tests: No results for input(s): AST, ALT, ALKPHOS, BILITOT, PROT, ALBUMIN in the last 168 hours. No results for input(s): LIPASE, AMYLASE in the last 168 hours. No results for input(s): AMMONIA in the last 168 hours. CBC: Recent  Labs  Lab 11/03/17 1225  WBC 7.2  NEUTROABS 4.7  HGB 14.0  HCT 39.1  MCV 93.8  PLT 220   Cardiac Enzymes: Recent Labs  Lab 11/03/17 1225  TROPONINI 0.03*    BNP (last 3 results) No results for input(s): BNP in the last 8760 hours.  ProBNP (last 3 results) No results for input(s): PROBNP in the last 8760 hours.  CBG: No results for input(s): GLUCAP in the last 168 hours.  Radiological Exams on Admission: Dg Chest 2 View  Result Date: 11/03/2017 CLINICAL DATA:  Intermittent chest pressure EXAM: CHEST - 2 VIEW COMPARISON:  11/30/2008 FINDINGS: The heart size and mediastinal contours are within normal limits. Both lungs are clear. The visualized skeletal structures are unremarkable. IMPRESSION: No acute abnormality noted. Electronically Signed   By: Inez Catalina M.D.   On: 11/03/2017 12:10    EKG: Independently reviewed.  Sinus bradycardia  Assessment/Plan Present on Admission: . Chest pain  Principal Problem:   Chest pain Active Problems:   HTN (hypertension)  Chest pain R/O ASC ? Typical, on exertion ED troponin 0.03, will trend A1c, lipid panel pending EKG sinus bradycardia Chest x-ray, no acute abnormality noted Echo pending Continue daily aspirin, metoprolol Cardiology consulted, appreciate recommendations Telemetry, monitor closely  Hypertension Stable Continue losartan, hydrochlorothiazide    DVT prophylaxis: Lovenox  Code Status: Full  Family Communication: Wife at bedside  Disposition Plan: Home once work-up complete  Consults called: Cardiology  Admission status: Observation    Alma Friendly MD [facility]   If 7PM-7AM, please contact night-coverage [email or web address]  11/03/2017, 4:08 PM

## 2017-11-04 ENCOUNTER — Other Ambulatory Visit: Payer: Self-pay

## 2017-11-04 ENCOUNTER — Encounter (HOSPITAL_COMMUNITY): Payer: Self-pay | Admitting: *Deleted

## 2017-11-04 ENCOUNTER — Other Ambulatory Visit (HOSPITAL_COMMUNITY): Payer: Self-pay | Admitting: *Deleted

## 2017-11-04 ENCOUNTER — Other Ambulatory Visit (HOSPITAL_COMMUNITY): Payer: Self-pay | Admitting: Cardiovascular Disease

## 2017-11-04 ENCOUNTER — Observation Stay (HOSPITAL_BASED_OUTPATIENT_CLINIC_OR_DEPARTMENT_OTHER): Payer: Medicare Other

## 2017-11-04 ENCOUNTER — Encounter (HOSPITAL_COMMUNITY)
Admission: EM | Disposition: A | Discharge status: Home / Self Care | Payer: Self-pay | Source: Home / Self Care | Attending: Emergency Medicine

## 2017-11-04 DIAGNOSIS — E785 Hyperlipidemia, unspecified: Secondary | ICD-10-CM | POA: Diagnosis not present

## 2017-11-04 DIAGNOSIS — I259 Chronic ischemic heart disease, unspecified: Secondary | ICD-10-CM | POA: Diagnosis not present

## 2017-11-04 DIAGNOSIS — R001 Bradycardia, unspecified: Secondary | ICD-10-CM

## 2017-11-04 DIAGNOSIS — I080 Rheumatic disorders of both mitral and aortic valves: Secondary | ICD-10-CM | POA: Diagnosis not present

## 2017-11-04 DIAGNOSIS — R079 Chest pain, unspecified: Secondary | ICD-10-CM | POA: Diagnosis not present

## 2017-11-04 DIAGNOSIS — I110 Hypertensive heart disease with heart failure: Secondary | ICD-10-CM | POA: Diagnosis not present

## 2017-11-04 DIAGNOSIS — I498 Other specified cardiac arrhythmias: Secondary | ICD-10-CM | POA: Diagnosis not present

## 2017-11-04 DIAGNOSIS — I351 Nonrheumatic aortic (valve) insufficiency: Secondary | ICD-10-CM | POA: Diagnosis not present

## 2017-11-04 DIAGNOSIS — I2510 Atherosclerotic heart disease of native coronary artery without angina pectoris: Principal | ICD-10-CM

## 2017-11-04 DIAGNOSIS — E782 Mixed hyperlipidemia: Secondary | ICD-10-CM | POA: Diagnosis not present

## 2017-11-04 DIAGNOSIS — I5030 Unspecified diastolic (congestive) heart failure: Secondary | ICD-10-CM | POA: Diagnosis not present

## 2017-11-04 DIAGNOSIS — I200 Unstable angina: Secondary | ICD-10-CM

## 2017-11-04 DIAGNOSIS — R0609 Other forms of dyspnea: Secondary | ICD-10-CM

## 2017-11-04 DIAGNOSIS — I10 Essential (primary) hypertension: Secondary | ICD-10-CM | POA: Diagnosis not present

## 2017-11-04 HISTORY — PX: LEFT HEART CATH AND CORONARY ANGIOGRAPHY: CATH118249

## 2017-11-04 HISTORY — PX: INTRAVASCULAR PRESSURE WIRE/FFR STUDY: CATH118243

## 2017-11-04 LAB — CBC
HCT: 38.8 % — ABNORMAL LOW (ref 39.0–52.0)
Hemoglobin: 13.7 g/dL (ref 13.0–17.0)
MCH: 33.3 pg (ref 26.0–34.0)
MCHC: 35.3 g/dL (ref 30.0–36.0)
MCV: 94.2 fL (ref 78.0–100.0)
PLATELETS: 223 10*3/uL (ref 150–400)
RBC: 4.12 MIL/uL — AB (ref 4.22–5.81)
RDW: 12.9 % (ref 11.5–15.5)
WBC: 6.6 10*3/uL (ref 4.0–10.5)

## 2017-11-04 LAB — POCT ACTIVATED CLOTTING TIME: ACTIVATED CLOTTING TIME: 241 s

## 2017-11-04 LAB — BASIC METABOLIC PANEL
Anion gap: 8 (ref 5–15)
BUN: 18 mg/dL (ref 8–23)
CALCIUM: 9 mg/dL (ref 8.9–10.3)
CO2: 27 mmol/L (ref 22–32)
CREATININE: 0.93 mg/dL (ref 0.61–1.24)
Chloride: 103 mmol/L (ref 98–111)
Glucose, Bld: 98 mg/dL (ref 70–99)
Potassium: 3.8 mmol/L (ref 3.5–5.1)
SODIUM: 138 mmol/L (ref 135–145)

## 2017-11-04 LAB — TROPONIN I: TROPONIN I: 0.03 ng/mL — AB (ref <0.03)

## 2017-11-04 LAB — LIPID PANEL
CHOL/HDL RATIO: 4.8 ratio
CHOLESTEROL: 184 mg/dL (ref 0–200)
HDL: 38 mg/dL — AB (ref >40)
LDL Cholesterol: 128 mg/dL — ABNORMAL HIGH (ref 0–99)
TRIGLYCERIDES: 92 mg/dL (ref <150)
VLDL: 18 mg/dL (ref 0–40)

## 2017-11-04 LAB — HEPATIC FUNCTION PANEL
ALK PHOS: 40 U/L (ref 38–126)
ALT: 73 U/L — ABNORMAL HIGH (ref 0–44)
AST: 58 U/L — ABNORMAL HIGH (ref 15–41)
Albumin: 3.8 g/dL (ref 3.5–5.0)
BILIRUBIN INDIRECT: 0.7 mg/dL (ref 0.3–0.9)
BILIRUBIN TOTAL: 0.8 mg/dL (ref 0.3–1.2)
Bilirubin, Direct: 0.1 mg/dL (ref 0.0–0.2)
TOTAL PROTEIN: 6.7 g/dL (ref 6.5–8.1)

## 2017-11-04 LAB — ECHOCARDIOGRAM COMPLETE
HEIGHTINCHES: 66 in
WEIGHTICAEL: 3392 [oz_av]

## 2017-11-04 SURGERY — LEFT HEART CATH AND CORONARY ANGIOGRAPHY
Anesthesia: LOCAL

## 2017-11-04 MED ORDER — SODIUM CHLORIDE 0.9% FLUSH: 3.0000 mL | INTRAVENOUS | Status: DC | PRN

## 2017-11-04 MED ORDER — ATORVASTATIN CALCIUM 80 MG PO TABS
80.0000 mg | ORAL_TABLET | Freq: Every day | ORAL | Status: DC
  Administered 2017-11-04: 18:00:00 80 mg via ORAL
  Filled 2017-11-04: qty 1

## 2017-11-04 MED ORDER — ATORVASTATIN CALCIUM 40 MG PO TABS: 40.0000 mg | ORAL_TABLET | Freq: Every day | ORAL | Status: DC

## 2017-11-04 MED ORDER — SODIUM CHLORIDE 0.9 % WEIGHT BASED INFUSION: 3.0000 mL/kg/h | INTRAVENOUS | Status: AC

## 2017-11-04 MED ORDER — ADENOSINE 12 MG/4ML IV SOLN
INTRAVENOUS | Status: AC
  Filled 2017-11-04: qty 16

## 2017-11-04 MED ORDER — ONDANSETRON HCL 4 MG/2ML IJ SOLN: 4.0000 mg | Freq: Four times a day (QID) | INTRAMUSCULAR | Status: DC | PRN

## 2017-11-04 MED ORDER — HEPARIN (PORCINE) IN NACL 1000-0.9 UT/500ML-% IV SOLN
INTRAVENOUS | Status: AC
  Filled 2017-11-04: qty 1000

## 2017-11-04 MED ORDER — SODIUM CHLORIDE 0.9% FLUSH: 3.0000 mL | Freq: Two times a day (BID) | INTRAVENOUS | Status: DC

## 2017-11-04 MED ORDER — HYDRALAZINE HCL 20 MG/ML IJ SOLN
10.0000 mg | Freq: Four times a day (QID) | INTRAMUSCULAR | Status: DC | PRN
  Administered 2017-11-04: 18:00:00 10 mg via INTRAVENOUS
  Filled 2017-11-04: qty 1

## 2017-11-04 MED ORDER — LIDOCAINE HCL (PF) 1 % IJ SOLN
INTRAMUSCULAR | Status: AC
  Filled 2017-11-04: qty 30

## 2017-11-04 MED ORDER — LIDOCAINE HCL (PF) 1 % IJ SOLN
INTRAMUSCULAR | Status: DC | PRN
  Administered 2017-11-04: 2 mL

## 2017-11-04 MED ORDER — MIDAZOLAM HCL 2 MG/2ML IJ SOLN
INTRAMUSCULAR | Status: AC
  Filled 2017-11-04: qty 2

## 2017-11-04 MED ORDER — ADENOSINE (DIAGNOSTIC) 140MCG/KG/MIN
INTRAVENOUS | Status: DC | PRN
  Administered 2017-11-04: 140 ug/kg/min via INTRAVENOUS

## 2017-11-04 MED ORDER — ACETAMINOPHEN 325 MG PO TABS: 650.0000 mg | ORAL_TABLET | ORAL | Status: DC | PRN

## 2017-11-04 MED ORDER — FENTANYL CITRATE (PF) 100 MCG/2ML IJ SOLN
INTRAMUSCULAR | Status: AC
  Filled 2017-11-04: qty 2

## 2017-11-04 MED ORDER — SODIUM CHLORIDE 0.9 % IV SOLN: 250.0000 mL | INTRAVENOUS | Status: DC | PRN

## 2017-11-04 MED ORDER — HEPARIN SODIUM (PORCINE) 1000 UNIT/ML IJ SOLN
INTRAMUSCULAR | Status: AC
  Filled 2017-11-04: qty 1

## 2017-11-04 MED ORDER — SODIUM CHLORIDE 0.9% FLUSH
3.0000 mL | Freq: Two times a day (BID) | INTRAVENOUS | Status: DC
  Administered 2017-11-04: 3 mL via INTRAVENOUS

## 2017-11-04 MED ORDER — ISOSORBIDE MONONITRATE ER 30 MG PO TB24
30.0000 mg | ORAL_TABLET | Freq: Every day | ORAL | Status: DC
  Administered 2017-11-04 – 2017-11-05 (×2): 30 mg via ORAL
  Filled 2017-11-04 (×2): qty 1

## 2017-11-04 MED ORDER — FENTANYL CITRATE (PF) 100 MCG/2ML IJ SOLN
INTRAMUSCULAR | Status: DC | PRN
  Administered 2017-11-04: 25 ug via INTRAVENOUS

## 2017-11-04 MED ORDER — ASPIRIN 81 MG PO CHEW
81.0000 mg | CHEWABLE_TABLET | ORAL | Status: AC
  Administered 2017-11-04: 81 mg via ORAL

## 2017-11-04 MED ORDER — VERAPAMIL HCL 2.5 MG/ML IV SOLN
INTRAVENOUS | Status: DC | PRN
  Administered 2017-11-04: 10 mL via INTRA_ARTERIAL

## 2017-11-04 MED ORDER — HEPARIN (PORCINE) IN NACL 1000-0.9 UT/500ML-% IV SOLN
INTRAVENOUS | Status: DC | PRN
  Administered 2017-11-04: 500 mL

## 2017-11-04 MED ORDER — AMLODIPINE BESYLATE 5 MG PO TABS: 5.0000 mg | ORAL_TABLET | Freq: Every day | ORAL | Status: DC

## 2017-11-04 MED ORDER — MIDAZOLAM HCL 2 MG/2ML IJ SOLN
INTRAMUSCULAR | Status: DC | PRN
  Administered 2017-11-04: 1 mg via INTRAVENOUS
  Administered 2017-11-04: 2 mg via INTRAVENOUS

## 2017-11-04 MED ORDER — SODIUM CHLORIDE 0.9 % WEIGHT BASED INFUSION: 1.0000 mL/kg/h | INTRAVENOUS | Status: DC

## 2017-11-04 MED ORDER — ASPIRIN 81 MG PO CHEW
CHEWABLE_TABLET | ORAL | Status: AC
  Filled 2017-11-04: qty 1

## 2017-11-04 MED ORDER — VERAPAMIL HCL 2.5 MG/ML IV SOLN
INTRAVENOUS | Status: AC
  Filled 2017-11-04: qty 2

## 2017-11-04 MED ORDER — HEPARIN SODIUM (PORCINE) 1000 UNIT/ML IJ SOLN
INTRAMUSCULAR | Status: DC | PRN
  Administered 2017-11-04: 2000 [IU] via INTRAVENOUS
  Administered 2017-11-04 (×2): 5000 [IU] via INTRAVENOUS

## 2017-11-04 MED ORDER — AMLODIPINE BESYLATE 5 MG PO TABS
5.0000 mg | ORAL_TABLET | Freq: Every day | ORAL | Status: DC
  Administered 2017-11-04: 5 mg via ORAL
  Filled 2017-11-04: qty 1

## 2017-11-04 MED ORDER — IOHEXOL 350 MG/ML SOLN
INTRAVENOUS | Status: DC | PRN
  Administered 2017-11-04: 125 mL via INTRA_ARTERIAL

## 2017-11-04 MED ORDER — SODIUM CHLORIDE 0.9 % IV SOLN
INTRAVENOUS | Status: DC
  Administered 2017-11-04 (×2): via INTRAVENOUS

## 2017-11-04 MED ORDER — ASPIRIN 81 MG PO CHEW: 81.0000 mg | CHEWABLE_TABLET | Freq: Every day | ORAL | Status: DC

## 2017-11-04 SURGICAL SUPPLY — 14 items
CATH INFINITI 5FR ANG PIGTAIL (CATHETERS) ×1 IMPLANT
CATH OPTITORQUE TIG 4.0 5F (CATHETERS) ×1 IMPLANT
CATH VISTA GUIDE 6FR XBLAD3.5 (CATHETERS) ×1 IMPLANT
DEVICE RAD COMP TR BAND LRG (VASCULAR PRODUCTS) ×1 IMPLANT
GLIDESHEATH SLEND SS 6F .021 (SHEATH) ×2 IMPLANT
GUIDEWIRE INQWIRE 1.5J.035X260 (WIRE) IMPLANT
GUIDEWIRE PRESSURE COMET II (WIRE) ×1 IMPLANT
INQWIRE 1.5J .035X260CM (WIRE) ×2
KIT ESSENTIALS PG (KITS) ×1 IMPLANT
KIT HEART LEFT (KITS) ×2 IMPLANT
PACK CARDIAC CATHETERIZATION (CUSTOM PROCEDURE TRAY) ×2 IMPLANT
SYR MEDRAD MARK V 150ML (SYRINGE) ×2 IMPLANT
TRANSDUCER W/STOPCOCK (MISCELLANEOUS) ×2 IMPLANT
TUBING CIL FLEX 10 FLL-RA (TUBING) ×2 IMPLANT

## 2017-11-04 NOTE — Interval H&P Note (Signed)
Cath Lab Visit (complete for each Cath Lab visit)  Clinical Evaluation Leading to the Procedure:   ACS: No.  Non-ACS:    Anginal Classification: CCS III  Anti-ischemic medical therapy: Minimal Therapy (1 class of medications)  Non-Invasive Test Results: No non-invasive testing performed  Prior CABG: No previous CABG      History and Physical Interval Note:  11/04/2017 2:13 PM  [name]  has presented today for surgery, with the diagnosis of non stemi  The various methods of treatment have been discussed with the patient and family. After consideration of risks, benefits and other options for treatment, the patient has consented to  Procedure(s): LEFT HEART CATH AND CORONARY ANGIOGRAPHY (N/A) as a surgical intervention .  The patient's history has been reviewed, patient examined, no change in status, stable for surgery.  I have reviewed the patient's chart and labs.  Questions were answered to the patient's satisfaction.     Shelva Majestic

## 2017-11-04 NOTE — Consult Note (Addendum)
Cardiology Consultation:   Patient ID: [name]; [id number]; [birth year]   Admit date: 11/03/2017 Date of Consult: 11/04/2017  Primary Care Provider: Sharilyn Sites, MD Primary Cardiologist: Kate Sable, MD (new)  Chief Complaint: chest pain  Patient Profile:   [name] is a 72 y.o. 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 Horris Latino.  History of Present Illness:   [name] has no prior cardiac history but risk factors noted above. For several months he's been experiencing exertional chest tightness associated with dyspnea. He has not had any rest pain. He started noticing this initially when letting his dog out in the morning or walking up a hill to the mailbox. It resolves within several minutes of rest. The pain has been increasing in frequency/severity recently so he went to his PCP 2 days ago. Per report, OP troponin was 0.10 so he was advised to come to ER where troponins have all been low/flat 0.03. He did have a recent URI that has been resolving. He feels well today lying at rest. EKG shows nonspecific changes. No prior cardiac w/u noted. He was noted to be bradycardic into the upper 30s/40s on telemetry overnight during sleeping hours and he was asymptomatic. It does look like he also had a junctional rhythm in the 60s yesterday afternoon. No dizziness or presyncope/syncope reported.  Past Medical History:  Diagnosis Date  . Cancer ([place]) 07/2012   prostate  . Hypertension     Past Surgical History:  Procedure Laterality Date  . COLONOSCOPY N/A 08/28/2014   Procedure: COLONOSCOPY;  Surgeon: Aviva Signs, MD;  Location[facility];  Service: Gastroenterology;  Laterality: N/A;  . PROSTATE SURGERY  12/2012  . SHOULDER SURGERY Bilateral      Inpatient Medications: Scheduled Meds: . aspirin  EC  81 mg Oral Daily  . enoxaparin (LOVENOX) injection  40 mg Subcutaneous Q24H  . hydrochlorothiazide  25 mg Oral Daily  . losartan  100 mg Oral Daily  . metoprolol tartrate  12.5 mg Oral BID  . pneumococcal 23 valent vaccine  0.5 mL Intramuscular Tomorrow-1000  . sodium chloride flush  3 mL Intravenous Q12H   Continuous Infusions:  PRN Meds: acetaminophen **OR** acetaminophen, albuterol, ondansetron **OR** ondansetron (ZOFRAN) IV, senna-docusate  Home Meds: Prior to Admission medications   Medication Sig Start Date End Date Taking? Authorizing Provider  aspirin 81 MG tablet Take 81 mg by mouth daily.   Yes [provider]  losartan-hydrochlorothiazide (HYZAAR) 100-25 MG per tablet Take 1 tablet by mouth daily.   Yes [provider]  metoprolol tartrate (LOPRESSOR) 25 MG tablet Take 1 tablet by mouth 2 (two) times daily. 11/02/17  Yes [provider]  Omega-3 1000 MG CAPS Take 1 capsule by mouth daily as needed.    Yes [provider]    Allergies:    Allergies  Allergen Reactions  . Penicillins     Eye swelled shut  .Has patient had a PCN reaction causing immediate rash, facial/tongue/throat swelling, SOB or lightheadedness with hypotension: Yes Has patient had a PCN reaction causing severe rash involving mucus membranes or skin necrosis: No Has patient had a PCN reaction that required hospitalization: No Has patient had a PCN reaction occurring within the last 10 years: No If all of the above answers are "NO", then  may proceed with Cephalosporin use    Social History:   Social History   Socioeconomic History  . Marital status: Married    Spouse name: Not on file  . Number of children: Not on file  . Years of education: Not on file  . Highest education level: Not on file  Occupational History  . Not on file  Social Needs  . Financial resource strain: Not on file  . Food insecurity:    Worry: Not on file    Inability: Not on file    . Transportation needs:    Medical: Not on file    Non-medical: Not on file  Tobacco Use  . Smoking status: Former Smoker    Packs/day: 0.25    Years: 18.00    Pack years: 4.50    Types: Cigarettes  . Smokeless tobacco: Former Systems developer    Quit date: 02/09/1979  Substance and Sexual Activity  . Alcohol use: No  . Drug use: No  . Sexual activity: Not on file  Lifestyle  . Physical activity:    Days per week: Not on file    Minutes per session: Not on file  . Stress: Not on file  Relationships  . Social connections:    Talks on phone: Not on file    Gets together: Not on file    Attends religious service: Not on file    Active member of club or organization: Not on file    Attends meetings of clubs or organizations: Not on file    Relationship status: Not on file  . Intimate partner violence:    Fear of current or ex partner: Not on file    Emotionally abused: Not on file    Physically abused: Not on file    Forced sexual activity: Not on file  Other Topics Concern  . Not on file  Social History Narrative  . Not on file    Family History:   The patient's family history includes Heart attack in his father; Kidney disease in his brother.  ROS:  Please see the history of present illness.  All other ROS reviewed and negative.     Physical Exam/Data:   Vitals:   11/03/17 2009 11/03/17 2055 11/03/17 2210 11/04/17 0636  BP:  (!) 199/74 (!) 164/77 (!) 144/71  Pulse:  62  (!) 49  Resp:  17  15  Temp:  98.6 F (37 C)  98.4 F (36.9 C)  TempSrc:  Oral  Oral  SpO2: 95% 96%  95%  Weight:      Height:        Intake/Output Summary (Last 24 hours) at 11/04/2017 0855 Last data filed at 11/04/2017 0600 Gross per 24 hour  Intake 240 ml  Output 1 ml  Net 239 ml   Filed Weights   11/03/17 1125  Weight: 96.2 kg   Body mass index is 34.22 kg/m.  General: Well developed, well nourished, in no acute distress. Head: Normocephalic, atraumatic, sclera non-icteric, no  xanthomas, nares are without discharge.  Neck: Negative for carotid bruits. JVD not elevated. Lungs: Clear bilaterally to auscultation without wheezes, rales, or rhonchi. Breathing is unlabored. Heart: RRR with S1 S2. No murmurs, rubs, or gallops appreciated. Abdomen: Soft, non-tender, non-distended with normoactive bowel sounds. No hepatomegaly. No rebound/guarding. No obvious abdominal masses. Msk:  Strength and tone appear normal for age. Extremities: No clubbing or cyanosis. No edema.  Distal pedal pulses are 2+ and equal bilaterally. Neuro: Alert and oriented X  3. No facial asymmetry. No focal deficit. Moves all extremities spontaneously. Psych:  Responds to questions appropriately with a normal affect.  EKG:  The EKG was personally reviewed and demonstrates sinus bradycardia 58bpm TWI I ,avL, V2  Relevant CV Studies: n/a  Laboratory Data:  Chemistry Recent Labs  Lab 11/03/17 1225 11/04/17 0405  NA 136 138  K 3.9 3.8  CL 106 103  CO2 21* 27  GLUCOSE 87 98  BUN 18 18  CREATININE 0.93 0.93  CALCIUM 9.1 9.0  GFRNONAA >60 >60  GFRAA >60 >60  ANIONGAP 9 8    No results for input(s): PROT, ALBUMIN, AST, ALT, ALKPHOS, BILITOT in the last 168 hours. Hematology Recent Labs  Lab 11/03/17 1225 11/04/17 0405  WBC 7.2 6.6  RBC 4.17* 4.12*  HGB 14.0 13.7  HCT 39.1 38.8*  MCV 93.8 94.2  MCH 33.6 33.3  MCHC 35.8 35.3  RDW 12.9 12.9  PLT 220 223   Cardiac Enzymes Recent Labs  Lab 11/03/17 1225 11/03/17 1621 11/03/17 2226 11/04/17 0405  TROPONINI 0.03* 0.03* 0.03* 0.03*   No results for input(s): TROPIPOC in the last 168 hours.  BNPNo results for input(s): BNP, PROBNP in the last 168 hours.  DDimer No results for input(s): DDIMER in the last 168 hours.  Radiology/Studies:  Dg Chest 2 View  Result Date: 11/03/2017 CLINICAL DATA:  Intermittent chest pressure EXAM: CHEST - 2 VIEW COMPARISON:  11/30/2008 FINDINGS: The heart size and mediastinal contours are within  normal limits. Both lungs are clear. The visualized skeletal structures are unremarkable. IMPRESSION: No acute abnormality noted. Electronically Signed   By: Inez Catalina M.D.   On: 11/03/2017 12:10    Assessment and Plan:   1. Chest pain consistent with unstable angina - troponin is mildly elevated in the setting of recent progressive exertional symptoms. Suspect cath is warranted in absence of other obvious causes of symptoms. Risks and benefits of cardiac catheterization have been discussed with the patient.  These include bleeding, infection, kidney damage, stroke, heart attack, death.  The patient understands these risks and is willing to proceed. Will d/w Dr. Bronson Ing. He was not started on heparin on admission. As the patient is currently pain free, will hold off given possible procedure. Continue ASA. D/C BB due to bradycardia. Add statin and check baseline LFTs. If ischemic workup is unrevealing, will need to consider chronotropic incompetence in the differential. MD to see to finalize recs.  2. Essential HTN  - not well controlled. We are discontinuing metoprolol. Will add amlodipine 5mg  daily. Check TSH with next labs.  3. Hyperlipidemia - as above. If the patient is tolerating statin at time of follow-up appointment, would consider rechecking liver function/lipid panel in 6-8 weeks.  4. Sinus bradycardia/junctional rhythm - stop beta blocker and follow. Asymptomatic at rest. Follow off metoprolol. Check TSH with AM labs. Consider OP eval for OSA given nocturnal bradycardia and snoring.   For questions or updates, please contact [place] Please consult [email or web address] for contact info under Cardiology/STEMI.    Signed, Charlie Pitter, PA-C  11/04/2017 8:55 AM   The patient was seen and examined, and I agree with the history, physical exam, assessment and plan as documented above, with modifications as noted below. I have also personally reviewed all relevant documentation, old  records, labs, and both radiographic and cardiovascular studies. I have also independently interpreted old and new ECG's.  Briefly, this is a 72 yr old male with HTN, hyperlipidemia, and a prior h/o  tobacco use who has been experiencing progressive exertional dyspnea over the past 2 months. He has had retrosternal chest pressure with exertion alleviated with rest. He walks his dog up the driveway ([place]) to the mailbox which provoke symptoms. Denies radiation to jaw, back, and arms. Has put on quite a bit of weight. Says his energy levels have declined over the last several months.  He has also had a cold over the last few weeks. Denies fevers. Saw his PCP 2 days ago who checked troponin and found to be 0.1 and was instructed to come to ED.  Troponins reviewed above and are nonspecifically elevated.  I personally reviewed the ECG which shows sinus rhythm with TWI's in I, aVL, and V2 with otherwise nonspecific T wave abnormalities.  Noted he was bradycardic overnight (HR 30-40's) and had what appeared to be a junctional rhythm on 9/25, HR 60's. Asymptomatic during the aforementioned episodes.  Chest xray showed no acute abnormalities.  Overall presentation suspicious for accelerating angina. I spoke to the patient, his wife Jenny Reichmann) and his daughter about transferring to [facility] for coronary angiography and they are all in agreement with this plan. Continue ASA and will start atorvastatin (LD 128 on 9/25). Beta blocker stopped due to bradycardia. He is hypertensive so we are adding amlodipine. Already on losartan 100 mg.  I agree with outpatient sleep study given obesity, nocturnal bradycardia, and h/o snoring with fatigue.  Risks and benefits of cardiac catheterization have been discussed with the patient.  These include bleeding, infection, kidney damage, stroke, heart attack, death.  The patient understands these risks and is willing to proceed.   Kate Sable, MD,  [facility]  11/04/2017 10:09 AM

## 2017-11-04 NOTE — H&P (View-Only) (Signed)
[facility] called, transfer orders written, EMTALA done, cath lab aware and cath orders are in. Also notified Roby Lofts (acting as Manufacturing engineer) today and IM to let them know we will take on our service @ [facility]. Spoke to bed control as well. Dayna Dunn PA-C

## 2017-11-04 NOTE — Progress Notes (Addendum)
[facility] picked up patient for transport to [facility].  20 gauge to right hand flushed WNL.  Patient alert and oriented.  Denies pain.  Small sip with morning medications per Cardiology. Patient NPO since midnight, except for sip with morning medication.  Packet sent with patient.

## 2017-11-04 NOTE — Progress Notes (Signed)
PROGRESS NOTE  [name] CMK:[id number] DOB: 21-Aug-1945 DOA: 11/03/2017 PCP: Sharilyn Sites, MD  HPI/Recap of past 24 hours: [name] is a 72 y.o. male with medical history significant for HTN, prostate CA presents to the ED after being instructed by his PCP to come in due to elevated troponin. Patient reported having a cold about 3 weeks ago, which has currently resolved.  During this time, patient also noted some chest pain/pressure on exertion with associated shortness of breath.  Reported chest pain as substernal, denies any radiation, intermittent, and goes away with rest.  Last episode of chest pain was about a week ago.  Troponin drawn in PCP office on 11/02/2017 showed 0.10.  Patient advised to come to the ER. In the ED, repeat troponin was 0.03, EKG showed sinus bradycardia.  Cardiology consulted by EDP, requests admission for observation and further work-up.  Today, pt reports feeling about the same, denies any chest pain/pressure, N/V, worsening SOB. Cardiology transferring patient over to [facility] for cardiac cath. Cardiology will be the primary.   Assessment/Plan: Principal Problem:   Chest pain Active Problems:   HTN (hypertension)   Unstable angina (HCC)   Hyperlipidemia   Sinus bradycardia   Junctional rhythm  Chest pain R/O ASC ? Typical, on exertion ED troponin 0.03-->0.03-->0.03 A1c 5.8, lipid panel with LDL 128 EKG sinus bradycardia Chest x-ray, no acute abnormality noted Echo pending Continue daily aspirin, d/c metoprolol due to bradycardia Cardiology on board  Hypertension Stable Started on amlodipine, continue home losartan, hydrochlorothiazide  HLD LDL 128 Started on statins    Code Status: Full  Family Communication: None at bedside   Disposition Plan: As per Cardiology   Consultants:  Cardiology  Procedures:  None  Antimicrobials:  None  DVT prophylaxis:  Lovenox   Objective: Vitals:   11/03/17 2009 11/03/17  2055 11/03/17 2210 11/04/17 0636  BP:  (!) 199/74 (!) 164/77 (!) 144/71  Pulse:  62  (!) 49  Resp:  17  15  Temp:  98.6 F (37 C)  98.4 F (36.9 C)  TempSrc:  Oral  Oral  SpO2: 95% 96%  95%  Weight:      Height:        Intake/Output Summary (Last 24 hours) at 11/04/2017 1044 Last data filed at 11/04/2017 0600 Gross per 24 hour  Intake 240 ml  Output 1 ml  Net 239 ml   Filed Weights   11/03/17 1125  Weight: 96.2 kg    Exam:   General: NAD  Cardiovascular: S1, S2 present   Respiratory: CTAB  Abdomen: Soft, NT, ND, BS present  Musculoskeletal: No pedal edema  Skin: Normal  Psychiatry: Normal mood   Data Reviewed: CBC: Recent Labs  Lab 11/03/17 1225 11/04/17 0405  WBC 7.2 6.6  NEUTROABS 4.7  --   HGB 14.0 13.7  HCT 39.1 38.8*  MCV 93.8 94.2  PLT 220 569   Basic Metabolic Panel: Recent Labs  Lab 11/03/17 1225 11/04/17 0405  NA 136 138  K 3.9 3.8  CL 106 103  CO2 21* 27  GLUCOSE 87 98  BUN 18 18  CREATININE 0.93 0.93  CALCIUM 9.1 9.0   GFR: Estimated Creatinine Clearance: 76.8 mL/min (by C-G formula based on SCr of 0.93 mg/dL). Liver Function Tests: Recent Labs  Lab 11/04/17 0405  AST 58*  ALT 73*  ALKPHOS 40  BILITOT 0.8  PROT 6.7  ALBUMIN 3.8   No results for input(s): LIPASE, AMYLASE in the last  168 hours. No results for input(s): AMMONIA in the last 168 hours. Coagulation Profile: No results for input(s): INR, PROTIME in the last 168 hours. Cardiac Enzymes: Recent Labs  Lab 11/03/17 1225 11/03/17 1621 11/03/17 2226 11/04/17 0405  TROPONINI 0.03* 0.03* 0.03* 0.03*   BNP (last 3 results) No results for input(s): PROBNP in the last 8760 hours. HbA1C: Recent Labs    11/03/17 1621  HGBA1C 5.8*   CBG: No results for input(s): GLUCAP in the last 168 hours. Lipid Profile: Recent Labs    11/04/17 0405  CHOL 184  HDL 38*  LDLCALC 128*  TRIG 92  CHOLHDL 4.8   Thyroid Function Tests: No results for input(s): TSH,  T4TOTAL, FREET4, T3FREE, THYROIDAB in the last 72 hours. Anemia Panel: No results for input(s): VITAMINB12, FOLATE, FERRITIN, TIBC, IRON, RETICCTPCT in the last 72 hours. Urine analysis: No results found for: COLORURINE, APPEARANCEUR, LABSPEC, PHURINE, GLUCOSEU, HGBUR, BILIRUBINUR, KETONESUR, PROTEINUR, UROBILINOGEN, NITRITE, LEUKOCYTESUR Sepsis Labs: @LABRCNTIP (procalcitonin:4,lacticidven:4)  )No results found for this or any previous visit (from the past 240 hour(s)).    Studies: Dg Chest 2 View  Result Date: 11/03/2017 CLINICAL DATA:  Intermittent chest pressure EXAM: CHEST - 2 VIEW COMPARISON:  11/30/2008 FINDINGS: The heart size and mediastinal contours are within normal limits. Both lungs are clear. The visualized skeletal structures are unremarkable. IMPRESSION: No acute abnormality noted. Electronically Signed   By: Inez Catalina M.D.   On: 11/03/2017 12:10    Scheduled Meds: . amLODipine  5 mg Oral Daily  . aspirin  81 mg Oral Pre-Cath  . aspirin EC  81 mg Oral Daily  . atorvastatin  40 mg Oral q1800  . enoxaparin (LOVENOX) injection  40 mg Subcutaneous Q24H  . hydrochlorothiazide  25 mg Oral Daily  . losartan  100 mg Oral Daily  . pneumococcal 23 valent vaccine  0.5 mL Intramuscular Tomorrow-1000  . sodium chloride flush  3 mL Intravenous Q12H  . sodium chloride flush  3 mL Intravenous Q12H    Continuous Infusions: . sodium chloride    . sodium chloride     Followed by  . sodium chloride       LOS: 0 days     Alma Friendly, MD [facility]   If 7PM-7AM, please contact night-coverage [email or web address] Password [facility] 11/04/2017, 10:44 AM

## 2017-11-04 NOTE — Progress Notes (Signed)
[facility] called, transfer orders written, EMTALA done, cath lab aware and cath orders are in. Also notified Roby Lofts (acting as Manufacturing engineer) today and IM to let them know we will take on our service @ [facility]. Spoke to bed control as well. Dayna Dunn PA-C

## 2017-11-04 NOTE — Progress Notes (Signed)
  Echocardiogram 2D Echocardiogram has been performed.  Oza Oberle G Heron Pitcock 11/04/2017, 11:11 AM

## 2017-11-04 NOTE — Progress Notes (Addendum)
Received pt from [facility] via [facility].  Pt alert and oriented X4, denies any discomfort at this time.  Pt was placed on bedside  monitor  , IVF started in left upper arm, consent signed.  Family at bedside.

## 2017-11-05 ENCOUNTER — Encounter (HOSPITAL_COMMUNITY): Payer: Self-pay | Admitting: Cardiovascular Disease

## 2017-11-05 DIAGNOSIS — R001 Bradycardia, unspecified: Secondary | ICD-10-CM

## 2017-11-05 DIAGNOSIS — I110 Hypertensive heart disease with heart failure: Secondary | ICD-10-CM | POA: Diagnosis not present

## 2017-11-05 DIAGNOSIS — I259 Chronic ischemic heart disease, unspecified: Secondary | ICD-10-CM | POA: Diagnosis not present

## 2017-11-05 DIAGNOSIS — I080 Rheumatic disorders of both mitral and aortic valves: Secondary | ICD-10-CM | POA: Diagnosis not present

## 2017-11-05 DIAGNOSIS — I200 Unstable angina: Secondary | ICD-10-CM | POA: Diagnosis not present

## 2017-11-05 DIAGNOSIS — I498 Other specified cardiac arrhythmias: Secondary | ICD-10-CM | POA: Diagnosis not present

## 2017-11-05 DIAGNOSIS — I5030 Unspecified diastolic (congestive) heart failure: Secondary | ICD-10-CM | POA: Diagnosis not present

## 2017-11-05 DIAGNOSIS — I25110 Atherosclerotic heart disease of native coronary artery with unstable angina pectoris: Secondary | ICD-10-CM

## 2017-11-05 DIAGNOSIS — I2510 Atherosclerotic heart disease of native coronary artery without angina pectoris: Secondary | ICD-10-CM | POA: Diagnosis not present

## 2017-11-05 LAB — BASIC METABOLIC PANEL
Anion gap: 9 (ref 5–15)
BUN: 15 mg/dL (ref 8–23)
CHLORIDE: 107 mmol/L (ref 98–111)
CO2: 22 mmol/L (ref 22–32)
Calcium: 8.8 mg/dL — ABNORMAL LOW (ref 8.9–10.3)
Creatinine, Ser: 0.96 mg/dL (ref 0.61–1.24)
GFR calc Af Amer: >60 mL/min (ref >60)
GFR calc non Af Amer: >60 mL/min (ref >60)
Glucose, Bld: 122 mg/dL — ABNORMAL HIGH (ref 70–99)
POTASSIUM: 3.6 mmol/L (ref 3.5–5.1)
Sodium: 138 mmol/L (ref 135–145)

## 2017-11-05 LAB — CBC
HEMATOCRIT: 35.7 % — AB (ref 39.0–52.0)
HEMOGLOBIN: 12.6 g/dL — AB (ref 13.0–17.0)
MCH: 33.1 pg (ref 26.0–34.0)
MCHC: 35.3 g/dL (ref 30.0–36.0)
MCV: 93.7 fL (ref 78.0–100.0)
Platelets: 222 10*3/uL (ref 150–400)
RBC: 3.81 MIL/uL — AB (ref 4.22–5.81)
RDW: 12.6 % (ref 11.5–15.5)
WBC: 9.2 10*3/uL (ref 4.0–10.5)

## 2017-11-05 LAB — TSH: TSH: 2.867 u[IU]/mL (ref 0.350–4.500)

## 2017-11-05 MED ORDER — AMLODIPINE BESYLATE 10 MG PO TABS
10.0000 mg | ORAL_TABLET | Freq: Every day | ORAL | Status: DC
  Administered 2017-11-05: 10 mg via ORAL
  Filled 2017-11-05: qty 1

## 2017-11-05 MED ORDER — ATORVASTATIN CALCIUM 80 MG PO TABS: 80.0000 mg | ORAL_TABLET | Freq: Every day | ORAL | 4 refills | Status: DC

## 2017-11-05 MED ORDER — ISOSORBIDE MONONITRATE ER 30 MG PO TB24: 30.0000 mg | ORAL_TABLET | Freq: Every day | ORAL | 4 refills | Status: DC

## 2017-11-05 MED ORDER — AMLODIPINE BESYLATE 10 MG PO TABS: 10.0000 mg | ORAL_TABLET | Freq: Every day | ORAL | 4 refills | Status: DC

## 2017-11-05 MED ORDER — LOSARTAN POTASSIUM 100 MG PO TABS: 100.0000 mg | ORAL_TABLET | Freq: Every day | ORAL | 4 refills | Status: DC

## 2017-11-05 MED FILL — ATORVASTATIN CALCIUM 80 MG: 80 | 90 days supply | Qty: 90 | Fill #0

## 2017-11-05 MED FILL — LOSARTAN POTASSIUM 100 MG T: 100 | 90 days supply | Qty: 90 | Fill #0

## 2017-11-05 MED FILL — ISOSORBIDE MN ER 30 MG TAB: 30 | 90 days supply | Qty: 90 | Fill #0

## 2017-11-05 MED FILL — AMLODIPINE BESYLATE 10 MG T: 10 | 90 days supply | Qty: 90 | Fill #0

## 2017-11-05 NOTE — Discharge Summary (Addendum)
Discharge Summary    Patient ID: [name] MRN: [id number]; DOB: [birth year]  Admit date: 11/03/2017 Discharge date: 11/05/2017  Primary Care Provider: Sharilyn Sites, MD  Primary Cardiologist: Kate Sable, MD   Discharge Diagnoses    Principal Problem:   Chest pain Active Problems:   HTN (hypertension)   Unstable angina (HCC)   Hyperlipidemia   Sinus bradycardia   Junctional rhythm  Allergies Allergies  Allergen Reactions  . Penicillins     Eye swelled shut  .Has patient had a PCN reaction causing immediate rash, facial/tongue/throat swelling, SOB or lightheadedness with hypotension: Yes Has patient had a PCN reaction causing severe rash involving mucus membranes or skin necrosis: No Has patient had a PCN reaction that required hospitalization: No Has patient had a PCN reaction occurring within the last 10 years: No If all of the above answers are "NO", then may proceed with Cephalosporin use   Diagnostic Studies/Procedures    Cardiac catheterization 11/04/2017:   Ost RCA to Prox RCA lesion is 50% stenosed.  Prox RCA lesion is 20% stenosed.  Post Atrio lesion is 20% stenosed.  Prox LAD lesion is 75% stenosed.  Prox Cx to Mid Cx lesion is 10% stenosed.  1st Mrg lesion is 20% stenosed.  The left ventricular systolic function is normal.  LV end diastolic pressure is normal.  The left ventricular ejection fraction is 55-65% by visual estimate.  The proximal LAD has an eccentric stenosis in the region of a large 4 branch diagonal vessel which in most views does not appear significantly narrowed but in the RAO cranial view appears at least 75%. FFR and DFR were discordant with mild positivity on DFR and negative FFR.   Mild nonobstructive 20% circumflex stenoses.  Probable proximal RCA spasm with narrowing up to 50% and 20% proximal and distal RCA stenoses.  Normal LV function with an ejection fraction of 65% without focal segmental wall  motion abnormalities.  RECOMMENDATION: Since the patient was not on any prior anti-ischemic medications into his hospitalization, in light of the discordant flow data and large for branch diagonal vessel, the initial plan will be to attempt aggressive medical therapy with high potency statin therapy, addition of amlodipine, nitrates and beta-blocker. Will discontinue HCTZ. Consider PCI if patient continues to experience recurrent symptomatology on a good medical regimen.  Recommend Aspirin 81mg  daily for moderate CAD.  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 Horris Latino.  Mr. Belsito has no prior cardiac history but risk factors as noted above. For several months he had been experiencing exertional chest tightness associated with dyspnea and denies rest pain. He started noticing this initially when letting his dog out in the morning or walking up a hill to the mailbox. It resolved within several minutes of rest. The pain had been increasing in frequency/severity more recently, therefore he went to his PCP 2 prior to admission for further evaluation. Per report, OP troponin was 0.10 so he was advised to come to ER where troponins were all noted to be low/flat 0.03. He reported a recent URI that had been resolving. EKG showed nonspecific changes. No prior cardiac w/u noted. He was noted to be bradycardic into the upper 30s/40s on telemetry overnight during sleeping hours and was  asymptomatic. It was noted that he also had a junctional rhythm in the 60s yesterday afternoon, 11/03/17. He denied dizziness or presyncope/syncope. Plan was to transfer to [facility] for definitive hart cath evaluation.   Hospital Course     On 11/04/17 pt was taken to the cath lab which revealed with proximal LAD  has an eccentric stenosis in the region of a large 4 branch diagonal vessel. Per cath report in most views does not appear significantly narrowed but in the RAO cranial view appears at least 75%. FFR and DFR were discordant with mild positivity on DFR and negative FFR.  Mild nonobstructive 20% LCx stenosis and probable proximal RCA spasm with narrowing up to 50% and 20% proximal and distal RCA stenosis. Recommendations were for aggressive medical therapy with high potency statin, addition of amlodipine and nitrates.  Consideration for PCI if patient continues to experience recurrent symptoms. Echocardiogram with LVEF of 65 to 70% with G2 DD. ASA 81 daily for moderate CAD.   Other hospital problems include:  -Essential hypertension: -Elevated, 148/65, 157/58, 177/65 -Will increase amlodipine to 10 mg, continue losartan 100 mg, isosorbide mononitrate 30 mg -Hold BB in the setting of bradycardia -May need additional medication titration/addition given the discontinuation of BB    -Hyperlipidemia: -Elevated, LDL 128 with goal of <70 -Continue statin and follow LFT/lipid panel in 6 to 8 weeks  -Sinus bradycardia/junctional rhythm: -HR improved at beta-blocker stopped, heart rate 60-70s -Continues to be asymptomatic -TSH, 2.867 WNL -Consider outpatient evaluation for OSA given nocturnal bradycardia and snoring  Consultants: None   The patient was seen and examined by Dr. Marlou Porch who feels that he is stable and ready for discharge today, 11/05/17. Cath site unremarkable. Follow up appointment made. Denies chest pain today.   Discharge Vitals Blood pressure (!) 148/65, pulse 70, temperature 98.1 F (36.7 C), resp. rate 19, height 5\' 6"  (1.676 m), weight 94.4 kg, SpO2 96 %.  Filed Weights   11/03/17 1125 11/05/17 0359  Weight: 96.2 kg 94.4 kg   Labs & Radiologic Studies    CBC Recent Labs    11/03/17 1225 11/04/17 0405 11/05/17 0351  WBC 7.2 6.6 9.2  NEUTROABS 4.7  --   --   HGB  14.0 13.7 12.6*  HCT 39.1 38.8* 35.7*  MCV 93.8 94.2 93.7  PLT 220 223 009   Basic Metabolic Panel Recent Labs    11/04/17 0405 11/05/17 0351  NA 138 138  K 3.8 3.6  CL 103 107  CO2 27 22  GLUCOSE 98 122*  BUN 18 15  CREATININE 0.93 0.96  CALCIUM 9.0 8.8*   Liver Function Tests Recent Labs    11/04/17 0405  AST 58*  ALT 73*  ALKPHOS 40  BILITOT 0.8  PROT 6.7  ALBUMIN 3.8   Cardiac Enzymes Recent Labs    11/03/17 1621 11/03/17 2226 11/04/17 0405  TROPONINI 0.03* 0.03* 0.03*   Hemoglobin A1C Recent Labs    11/03/17 1621  HGBA1C 5.8*   Fasting Lipid Panel Recent Labs    11/04/17 0405  CHOL 184  HDL 38*  LDLCALC 128*  TRIG 92  CHOLHDL 4.8   Thyroid Function Tests Recent Labs    11/05/17 0351  TSH 2.867  ___________  Dg Chest 2 View  Result Date: 11/03/2017 CLINICAL DATA:  Intermittent chest pressure EXAM: CHEST - 2 VIEW COMPARISON:  11/30/2008 FINDINGS: The heart size and mediastinal contours are within normal limits. Both lungs are clear. The visualized skeletal structures are unremarkable. IMPRESSION:  No acute abnormality noted. Electronically Signed   By: Inez Catalina M.D.   On: 11/03/2017 12:10   Disposition   Pt is being discharged home today in good condition.  Follow-up Plans & Appointments    Follow-up Information    Herminio Commons, MD Follow up.   Specialty:  Cardiology Why:  The office will call you with date and time of appointment. If you have not heard from them by Monday 11/08/17, please call them.  Contact information: [address] [place] [address] [phone]          Discharge Instructions    Call MD for:  difficulty breathing, headache or visual disturbances   Complete by:  As directed    Call MD for:  extreme fatigue   Complete by:  As directed    Call MD for:  persistant dizziness or light-headedness   Complete by:  As directed    Call MD for:  persistant nausea and vomiting   Complete by:  As  directed    Call MD for:  redness, tenderness, or signs of infection (pain, swelling, redness, odor or green/yellow discharge around incision site)   Complete by:  As directed    Call MD for:  severe uncontrolled pain   Complete by:  As directed    Call MD for:  temperature >100.4   Complete by:  As directed    Diet - low sodium heart healthy   Complete by:  As directed    Discharge instructions   Complete by:  As directed    No driving for 3 days. No lifting over 5 lbs for 1 week. No sexual activity for 1 week. Keep procedure site clean & dry. If you notice increased pain, swelling, bleeding or pus, call/return!  You may shower, but no soaking baths/hot tubs/pools for 1 week.   Our office will call you with date/time of follow-up appointment.  Anticipate that this appointment will be in the next 1 to 2 weeks.  If you have not heard from our office by Monday, 11/08/2017 please call them.  We have included their phone number above.   Increase activity slowly   Complete by:  As directed      Discharge Medications   Allergies as of 11/05/2017      Reactions   Penicillins    Eye swelled shut .Has patient had a PCN reaction causing immediate rash, facial/tongue/throat swelling, SOB or lightheadedness with hypotension: Yes Has patient had a PCN reaction causing severe rash involving mucus membranes or skin necrosis: No Has patient had a PCN reaction that required hospitalization: No Has patient had a PCN reaction occurring within the last 10 years: No If all of the above answers are "NO", then may proceed with Cephalosporin use      Medication List    STOP taking these medications   losartan-hydrochlorothiazide 100-25 MG tablet Commonly known as:  HYZAAR   metoprolol tartrate 25 MG tablet Commonly known as:  LOPRESSOR     TAKE these medications   amLODipine 10 MG tablet Commonly known as:  NORVASC Take 1 tablet (10 mg total) by mouth daily.   aspirin 81 MG tablet Take 81 mg by  mouth daily.   atorvastatin 80 MG tablet Commonly known as:  LIPITOR Take 1 tablet (80 mg total) by mouth daily.   isosorbide mononitrate 30 MG 24 hr tablet Commonly known as:  IMDUR Take 1 tablet (30 mg total) by mouth daily.   losartan  100 MG tablet Commonly known as:  COZAAR Take 1 tablet (100 mg total) by mouth daily.   Omega-3 1000 MG Caps Take 1 capsule by mouth daily as needed.        Acute coronary syndrome (MI, NSTEMI, STEMI, etc) this admission?: No.    Outstanding Labs/Studies   LFTs/Lipid panel in 6-8 weeks   Duration of Discharge Encounter   Greater than 30 minutes including physician time.  Signed, Kathyrn Drown, NP 11/05/2017, 10:22 AM   Personally seen and examined. Agree with above.  No chest pain, no shortness of breath, ambulating well. Catheterization site clean dry and intact, tattoo noted on arm, regular rate and rhythm, lungs are clear, no edema, abdomen is soft Lab work personally reviewed, LDL 128  Assessment and plan:  Unstable angina - Catheterization FFR and DFR data reviewed, moderate CAD.  No PCI performed.  Continue with aggressive medical management.  See cath report above for full details.  Isosorbide has been started.  No metoprolol because of previous junctional bradycardia.  Increasing amlodipine as antianginal as well as antihypertensive.  Consider cardiac rehab in outpatient setting.  Aspirin 81.  High intensity statin.  Discussed with patient and family.  Okay with discharge.  Close follow-up.  Candee Furbish, MD

## 2017-11-05 NOTE — Progress Notes (Addendum)
Progress Note  Patient Name: [name] Date of Encounter: 11/05/2017  Primary Cardiologist: Dr. Bronson Ing, MD  Subjective   Pt feeling well this AM. Denies recurrent chest pain. Cath site unremarkable   Inpatient Medications    Scheduled Meds: . amLODipine  5 mg Oral Daily  . aspirin EC  81 mg Oral Daily  . atorvastatin  80 mg Oral q1800  . enoxaparin (LOVENOX) injection  40 mg Subcutaneous Q24H  . isosorbide mononitrate  30 mg Oral Daily  . losartan  100 mg Oral Daily  . pneumococcal 23 valent vaccine  0.5 mL Intramuscular Tomorrow-1000  . sodium chloride flush  3 mL Intravenous Q12H  . sodium chloride flush  3 mL Intravenous Q12H  . sodium chloride flush  3 mL Intravenous Q12H   Continuous Infusions: . sodium chloride    . sodium chloride Stopped (11/05/17 0526)  . sodium chloride    . sodium chloride     PRN Meds: sodium chloride, sodium chloride, acetaminophen **OR** acetaminophen, albuterol, hydrALAZINE, ondansetron **OR** ondansetron (ZOFRAN) IV, senna-docusate, sodium chloride flush, sodium chloride flush   Vital Signs    Vitals:   11/04/17 2042 11/04/17 2100 11/05/17 0359 11/05/17 0800  BP: (!) 181/70 (!) 177/65 (!) 157/58 (!) 148/65  Pulse: 73 72 62 70  Resp: 15 13 13 19   Temp: 97.7 F (36.5 C)  98.3 F (36.8 C) 98.1 F (36.7 C)  TempSrc: Oral  Oral   SpO2: 97% 96% 96% 96%  Weight:   94.4 kg   Height:        Intake/Output Summary (Last 24 hours) at 11/05/2017 0817 Last data filed at 11/05/2017 0526 Gross per 24 hour  Intake 2269.72 ml  Output 300 ml  Net 1969.72 ml   Filed Weights   11/03/17 1125 11/05/17 0359  Weight: 96.2 kg 94.4 kg    Physical Exam   General: Well developed, well nourished, NAD Skin: Warm, dry, intact  Head: Normocephalic, atraumatic, clear, moist mucus membranes. Neck: Negative for carotid bruits. No JVD Lungs:Clear to ausculation bilaterally. No wheezes, rales, or rhonchi. Breathing is  unlabored. Cardiovascular: RRR with S1 S2. No murmurs, rubs, gallops, or LV heave appreciated. Abdomen: Soft, non-tender, non-distended with normoactive bowel sounds. No obvious abdominal masses. MSK: Strength and tone appear normal for age. 5/5 in all extremities Extremities: No edema. No clubbing or cyanosis. DP/PT pulses 2+ bilaterally Neuro: Alert and oriented. No focal deficits. No facial asymmetry. MAE spontaneously. Psych: Responds to questions appropriately with normal affect.    Labs    Chemistry Recent Labs  Lab 11/03/17 1225 11/04/17 0405 11/05/17 0351  NA 136 138 138  K 3.9 3.8 3.6  CL 106 103 107  CO2 21* 27 22  GLUCOSE 87 98 122*  BUN 18 18 15   CREATININE 0.93 0.93 0.96  CALCIUM 9.1 9.0 8.8*  PROT  --  6.7  --   ALBUMIN  --  3.8  --   AST  --  58*  --   ALT  --  73*  --   ALKPHOS  --  40  --   BILITOT  --  0.8  --   GFRNONAA >60 >60 >60  GFRAA >60 >60 >60  ANIONGAP 9 8 9      Hematology Recent Labs  Lab 11/03/17 1225 11/04/17 0405 11/05/17 0351  WBC 7.2 6.6 9.2  RBC 4.17* 4.12* 3.81*  HGB 14.0 13.7 12.6*  HCT 39.1 38.8* 35.7*  MCV 93.8 94.2 93.7  MCH 33.6 33.3 33.1  MCHC 35.8 35.3 35.3  RDW 12.9 12.9 12.6  PLT 220 223 222    Cardiac Enzymes Recent Labs  Lab 11/03/17 1225 11/03/17 1621 11/03/17 2226 11/04/17 0405  TROPONINI 0.03* 0.03* 0.03* 0.03*   No results for input(s): TROPIPOC in the last 168 hours.   BNPNo results for input(s): BNP, PROBNP in the last 168 hours.   DDimer No results for input(s): DDIMER in the last 168 hours.   Radiology    Dg Chest 2 View  Result Date: 11/03/2017 CLINICAL DATA:  Intermittent chest pressure EXAM: CHEST - 2 VIEW COMPARISON:  11/30/2008 FINDINGS: The heart size and mediastinal contours are within normal limits. Both lungs are clear. The visualized skeletal structures are unremarkable. IMPRESSION: No acute abnormality noted. Electronically Signed   By: Inez Catalina M.D.   On: 11/03/2017 12:10    Telemetry    11/05/17 NSR HR 60-70 today  - Personally Reviewed  ECG    11/05/2017 NSR with evidence of LVH and no acute changes- Personally Reviewed  Cardiac Studies   Cardiac catheterization 11/04/2017:   Ost RCA to Prox RCA lesion is 50% stenosed.  Prox RCA lesion is 20% stenosed.  Post Atrio lesion is 20% stenosed.  Prox LAD lesion is 75% stenosed.  Prox Cx to Mid Cx lesion is 10% stenosed.  1st Mrg lesion is 20% stenosed.  The left ventricular systolic function is normal.  LV end diastolic pressure is normal.  The left ventricular ejection fraction is 55-65% by visual estimate.   The proximal LAD has an eccentric stenosis in the region of a large 4 branch diagonal vessel which in most views does not appear significantly narrowed but in the RAO cranial view appears at least 75%.  FFR and DFR were discordant with mild positivity on DFR and negative FFR.   Mild nonobstructive 20% circumflex stenoses.  Probable proximal RCA spasm with narrowing up to 50% and 20% proximal and distal RCA stenoses.  Normal LV function with an ejection fraction of 65% without focal segmental wall motion abnormalities.  RECOMMENDATION: Since the patient was not on any prior anti-ischemic medications into his hospitalization, in light of the discordant flow data and large for branch diagonal vessel, the initial plan will be to attempt aggressive medical therapy with high potency statin therapy, addition of amlodipine, nitrates and beta-blocker.  Will discontinue HCTZ.  Consider PCI if patient continues to experience recurrent symptomatology on a good medical regimen.  Recommend Aspirin 81mg  daily for moderate CAD.  Patient Profile     72 y.o. 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 Horris Latino.  Assessment & Plan    1.  Chest pain  consistent with unstable angina: -Cardiac catheterization 11/04/2017 with proximal LAD has an eccentric stenosis in the region of a large 4 branch diagonal vessel. Per cath report in most views does not appear significantly narrowed but in the RAO cranial view appears at least 75%.  FFR and DFR were discordant with mild positivity on DFR and negative FFR.  Mild nonobstructive 20% LCx stenosis and probable proximal RCA spasm with narrowing up to 50% and 20% proximal and distal RCA stenosis.  Commendations for aggressive medical therapy with high potency statin, addition of amlodipine and nitrates.  Consideration for PCI if patient continues to experience recurrent symptoms.  -  Echocardiogram with LVEF of 65 to 70% with G2 DD -ASA 81 daily for moderate CAD -Denies chest pain -Cath site unremarkable  2.  Essential hypertension: -Elevated, 148/65, 157/58, 177/65 -Will increase amlodipine to 10 mg, continue losartan 100 mg, isosorbide mononitrate 30 mg  3.  Hyperlipidemia: -Elevated, LDL 128 with goal of <70 -Continue statin and follow LFT/lipid panel in 6 to 8 weeks  4.  Sinus bradycardia/junctional rhythm: -HR improved at beta-blocker stopped, heart rate 60-70s -Continues to be asymptomatic -TSH, 2.867 WNL -Consider outpatient evaluation for OSA given nocturnal bradycardia and snoring  Signed, Kathyrn Drown NP-C [place] Pager: [phone] 11/05/2017, 8:17 AM     For questions or updates, please contact   Please consult [email or web address] for contact info under Cardiology/STEMI.  Personally seen and examined. Agree with above.  No chest pain, no shortness of breath, ambulating well. Catheterization site clean dry and intact, tattoo noted on arm, regular rate and rhythm, lungs are clear, no edema, abdomen is soft Lab work personally reviewed, LDL 128  Assessment and plan:  Unstable angina - Catheterization FFR and DFR data reviewed, moderate CAD.  No PCI performed.  Continue with  aggressive medical management.  See cath report above for full details.  Isosorbide has been started.  No metoprolol because of previous junctional bradycardia.  Increasing amlodipine as antianginal as well as antihypertensive.  Consider cardiac rehab in outpatient setting.  Aspirin 81.  High intensity statin.  Discussed with patient and family.  Okay with discharge.  Close follow-up.  Candee Furbish, MD

## 2017-11-12 ENCOUNTER — Encounter: Payer: Self-pay | Admitting: Cardiovascular Disease

## 2017-11-12 ENCOUNTER — Ambulatory Visit: Payer: Medicare Other | Admitting: Cardiovascular Disease

## 2017-11-12 VITALS — BP 144/64 | HR 81 | Ht 66.0 in | Wt 209.0 lb

## 2017-11-12 DIAGNOSIS — R001 Bradycardia, unspecified: Secondary | ICD-10-CM

## 2017-11-12 DIAGNOSIS — I10 Essential (primary) hypertension: Secondary | ICD-10-CM | POA: Diagnosis not present

## 2017-11-12 DIAGNOSIS — Z79899 Other long term (current) drug therapy: Secondary | ICD-10-CM

## 2017-11-12 DIAGNOSIS — I498 Other specified cardiac arrhythmias: Secondary | ICD-10-CM

## 2017-11-12 DIAGNOSIS — G4730 Sleep apnea, unspecified: Secondary | ICD-10-CM | POA: Diagnosis not present

## 2017-11-12 DIAGNOSIS — E785 Hyperlipidemia, unspecified: Secondary | ICD-10-CM

## 2017-11-12 DIAGNOSIS — I25118 Atherosclerotic heart disease of native coronary artery with other forms of angina pectoris: Secondary | ICD-10-CM

## 2017-11-12 DIAGNOSIS — E669 Obesity, unspecified: Secondary | ICD-10-CM

## 2017-11-12 NOTE — Progress Notes (Signed)
SUBJECTIVE: The patient presents for follow-up after undergoing coronary angiography on 11/04/2017.  There was a proximal LAD has an eccentric stenosis in the region of a large 4 branch diagonal vessel which in most views does not appear significantly narrowed but in the RAO cranial view appears at least 75%. FFR and DFR were discordant with mild positivity on DFR and negative FFR.   The initial plan is to attempt aggressive medical therapy with statins, amlodipine, and nitrates.  No beta blockers due to bradycardia and junctional rhythm.  If he continues to experience recurrent symptoms on optimal medical therapy, PCI will be considered.  The patient denies any symptoms of chest pain, palpitations, shortness of breath, lightheadedness, dizziness, leg swelling, orthopnea, PND, and syncope.  Echocardiogram on 11/04/2017 demonstrated vigorous left ventricular systolic function, LVEF 65 to 16%, grade 2 diastolic dysfunction, elevated filling pressures, mild aortic regurgitation, and LVH.  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  Review of Systems: As per "subjective", otherwise negative.  Allergies  Allergen Reactions  . Penicillins     Eye swelled shut  .Has patient had a PCN reaction causing immediate rash, facial/tongue/throat swelling, SOB or lightheadedness with hypotension: Yes Has patient had a PCN reaction causing severe rash involving mucus membranes or skin necrosis: No Has patient had a PCN reaction that required hospitalization: No Has patient had a PCN reaction occurring within the last 10 years: No If all of the above answers are "NO", then may proceed with Cephalosporin use    Current Outpatient Medications  Medication Sig Dispense Refill  . amLODipine (NORVASC) 10 MG tablet Take 1 tablet (10 mg total) by mouth daily. 90 tablet 4  . aspirin 81 MG tablet Take 81 mg by mouth daily.    Marland Kitchen atorvastatin (LIPITOR) 80 MG tablet Take 1 tablet  (80 mg total) by mouth daily. 90 tablet 4  . isosorbide mononitrate (IMDUR) 30 MG 24 hr tablet Take 1 tablet (30 mg total) by mouth daily. 90 tablet 4  . losartan (COZAAR) 100 MG tablet Take 1 tablet (100 mg total) by mouth daily. 90 tablet 4  . Omega-3 1000 MG CAPS Take 1 capsule by mouth daily as needed.      No current facility-administered medications for this visit.     Past Medical History:  Diagnosis Date  . Cancer ([place]) 07/2012   prostate  . Hypertension     Past Surgical History:  Procedure Laterality Date  . COLONOSCOPY N/A 08/28/2014   Procedure: COLONOSCOPY;  Surgeon: Aviva Signs, MD;  [facility];  Service: Gastroenterology;  Laterality: N/A;  . INTRAVASCULAR PRESSURE WIRE/FFR STUDY N/A 11/04/2017   Procedure: INTRAVASCULAR PRESSURE WIRE/DFR vs FFR  STUDY;  Surgeon: Troy Sine, MD;  Location: [place] [facility];  Service: Cardiovascular;  Laterality: N/A;  . LEFT HEART CATH AND CORONARY ANGIOGRAPHY N/A 11/04/2017   Procedure: LEFT HEART CATH AND CORONARY ANGIOGRAPHY;  Surgeon: Troy Sine, MD;  Location: [place] [facility];  Service: Cardiovascular;  Laterality: N/A;  . PROSTATE SURGERY  12/2012  . SHOULDER SURGERY Bilateral     Social History   Socioeconomic History  . Marital status: Married    Spouse name: Not on file  . Number of children: Not on file  . Years of education: Not on file  . Highest education level: Not on file  Occupational History  . Not on file  Social Needs  .  Financial resource strain: Not on file  . Food insecurity:    Worry: Not on file    Inability: Not on file  . Transportation needs:    Medical: Not on file    Non-medical: Not on file  Tobacco Use  . Smoking status: Former Smoker    Packs/day: 0.25    Years: 18.00    Pack years: 4.50    Types: Cigarettes  . Smokeless tobacco: Former Systems developer    Quit date: 02/09/1979  Substance and Sexual Activity  . Alcohol use: No  . Drug use: No  . Sexual activity: Not  on file  Lifestyle  . Physical activity:    Days per week: Not on file    Minutes per session: Not on file  . Stress: Not on file  Relationships  . Social connections:    Talks on phone: Not on file    Gets together: Not on file    Attends religious service: Not on file    Active member of club or organization: Not on file    Attends meetings of clubs or organizations: Not on file    Relationship status: Not on file  . Intimate partner violence:    Fear of current or ex partner: Not on file    Emotionally abused: Not on file    Physically abused: Not on file    Forced sexual activity: Not on file  Other Topics Concern  . Not on file  Social History Narrative  . Not on file     Vitals:   11/12/17 1319  BP: (!) 144/64  Pulse: 81  SpO2: 97%  Weight: 209 lb (94.8 kg)  Height: 5\' 6"  (1.676 m)    Wt Readings from Last 3 Encounters:  11/12/17 209 lb (94.8 kg)  11/05/17 208 lb 1.8 oz (94.4 kg)  08/28/14 190 lb (86.2 kg)     PHYSICAL EXAM General: NAD HEENT: Normal. Neck: No JVD, no thyromegaly. Lungs: Clear to auscultation bilaterally with normal respiratory effort. CV: Regular rate and rhythm, normal S1/S2, no C7/E9, 2/6 systolic murmur loudest over RUSB. No pretibial or periankle edema.   Abdomen: Firm, nontender, obese.  Neurologic: Alert and oriented.  Psych: Normal affect. Skin: Normal. Musculoskeletal: No gross deformities.    ECG: Reviewed above under Subjective   Labs: Lab Results  Component Value Date/Time   K 3.6 11/05/2017 03:51 AM   BUN 15 11/05/2017 03:51 AM   CREATININE 0.96 11/05/2017 03:51 AM   ALT 73 (H) 11/04/2017 04:05 AM   TSH 2.867 11/05/2017 03:51 AM   HGB 12.6 (L) 11/05/2017 03:51 AM     Lipids: Lab Results  Component Value Date/Time   LDLCALC 128 (H) 11/04/2017 04:05 AM   CHOL 184 11/04/2017 04:05 AM   TRIG 92 11/04/2017 04:05 AM   HDL 38 (L) 11/04/2017 04:05 AM       ASSESSMENT AND PLAN: 1.  Coronary artery disease:  Symptomatically stable.  Coronary angiography results detailed above.  He is now on aspirin, amlodipine, atorvastatin, losartan, and Imdur.  If symptoms cannot be controlled on optimal medical therapy, PCI will be considered.  2.  Hypertension: Blood pressure is mildly elevated.  He needs significant weight loss.  We discussed this at length.  He also needs an evaluation for sleep apnea.  I will make a referral.  No changes to medications today.  3.  Hyperlipidemia: Continue atorvastatin 80 mg.  LDL 128 on 11/04/2017.  I will repeat in 3 months.  4.  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  He has sleep disordered breathing.  I will obtain a sleep study.  5.  Sleep disordered breathing: He is obese, has a history of snoring with fatigue, has nocturnal bradycardia with occasional junctional rhythm.  He is at high risk for obstructive sleep apnea.  I will obtain a sleep study.  6.  Obesity: We discussed a routine exercise program for weight loss.    Disposition: Follow up 3-4 months   Kate Sable, M.D., F.A.C.C.

## 2017-11-12 NOTE — Patient Instructions (Addendum)
Medication Instructions:  No changes If you need a refill on your cardiac medications before your next appointment, please call your pharmacy.   Lab work:  Lipids in 3 months [place] [facility] VISIT If you have labs (blood work) drawn today and your tests are completely normal, you will receive your results only by: Marland Kitchen MyChart Message (if you have MyChart) OR . A paper copy in the mail If you have any lab test that is abnormal or we need to change your treatment, we will call you to review the results.  Testing/Procedures: Sleep study, you have been referred to [facility] for a sleep study.They will call you to schedule an apt  Follow-Up: At [facility], you and your health needs are our priority.  As part of our continuing mission to provide you with exceptional heart care, we have created designated Provider Care Teams.  These Care Teams include your primary Cardiologist (physician) and Advanced Practice Providers (APPs -  Physician Assistants and Nurse Practitioners) who all work together to provide you with the care you need, when you need it. Marland Kitchen 3-4 months with Dr.Koneswaran  Any Other Special Instructions Will Be Listed Below (If Applicable). NONE      Thank you for choosing [facility] [place] !

## 2018-01-10 ENCOUNTER — Ambulatory Visit (INDEPENDENT_AMBULATORY_CARE_PROVIDER_SITE_OTHER): Payer: Medicare Other | Admitting: Neurology

## 2018-01-10 ENCOUNTER — Encounter: Payer: Self-pay | Admitting: Neurology

## 2018-01-10 VITALS — BP 150/78 | HR 60 | Ht 66.0 in | Wt 207.0 lb

## 2018-01-10 DIAGNOSIS — I200 Unstable angina: Secondary | ICD-10-CM

## 2018-01-10 DIAGNOSIS — R0683 Snoring: Secondary | ICD-10-CM | POA: Diagnosis not present

## 2018-01-10 DIAGNOSIS — M7542 Impingement syndrome of left shoulder: Secondary | ICD-10-CM | POA: Diagnosis not present

## 2018-01-10 DIAGNOSIS — F329 Major depressive disorder, single episode, unspecified: Secondary | ICD-10-CM

## 2018-01-10 DIAGNOSIS — I2510 Atherosclerotic heart disease of native coronary artery without angina pectoris: Secondary | ICD-10-CM

## 2018-01-10 DIAGNOSIS — R5383 Other fatigue: Secondary | ICD-10-CM

## 2018-01-10 DIAGNOSIS — I2583 Coronary atherosclerosis due to lipid rich plaque: Secondary | ICD-10-CM

## 2018-01-10 DIAGNOSIS — F32A Depression, unspecified: Secondary | ICD-10-CM

## 2018-01-10 NOTE — Progress Notes (Signed)
SLEEP MEDICINE CLINIC   Provider:  Larey Seat, MD   Primary Care Physician:  Sharilyn Sites, MD   Referring Provider: Bronson Ing, MD Cardiology    Chief Complaint  Patient presents with  . internal referral    RM . Internal referral from Dr. Bronson Ing for sleep apnea. pt cardiologist sent him here to be evaluated. pt is told her snores in sleep. doesnt feel well rested at night. he states that when he lays down at night his nasal airway closes up..never had a sleep study    HPI:  [name] is a 72 y.o. male patient  , seen here on 01-10-2018 in a referral from Dr. Bronson Ing.  [name] is a 72 year old Caucasian gentleman who was recently evaluated for coronary artery disease.  He has almost every night trouble to breathe through his nose, reporting congestion of either both sides or one side of his nose at a time.  Even now as I speak here with him he clearly has to breathe through his mouth while we are holding interview.  He has some significant high blood pressure, currently poorly controlled.  He is not in pain, there is no headache no dizziness no palpitation.  The patient was seen for a coronary angiography on 04 November 2017 his proximal LAD has an eccentric stenosis and a 4 branch diagonal vessel which appeared narrowed.  75% narrowing was estimated.  The initial plan was to attempt aggressive medical therapy with statins, amlodipine and nitrates.  He has bradycardia and junctional rhythm and therefore was not placed based on calcium channel blockers or beta blockers.  An echocardiogram on the same day demonstrated vigorous left ventricular filling his ejection fraction was 65 to 70% but he has a grade 2 diastolic dysfunction with an elevated filling pressure mild aortic regurgitation and left ventricular hypertrophy.  There is no report of atrial fibrillation.    Sleep habits are as follows: dinner time is 5.30 PM and bedtime is at 10 PM. He has been  known to sleep quickly.  He does some exercise. He reads at night to help him sleep. The bedroom is cool, quiet an dark. He sleeps in all sleep positions, is restless. He uses 2 pillows. He dreams often and vivid  ([place]).   After three- four hour of sleep he wakes often up, not sure why- He wakes up at 3 AM for an bathroom break and usually a has a second one at 5 AM.  He rises at 9 AM- he is often awake for an hour before rising. He wakes without a dry mouth, no headaches.   Sleep medical history and family sleep history: HTN, uncontrolled, CAD, obesity, no DM, no thyroid disease.  He reports loss of desire and joy in the things he once enjoyed. He is frustration, depressed.    Social history:  Retired in [place] patient is married,  2 adult children, a former smoker does not use alcohol, caffeine use in form 2-3 coffee daily, very little soda or iced tea.  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  Review of Systems: Out of a complete 14 system review, the patient complains of only the following symptoms, and all other reviewed systems are negative. Snoring - apnea witnessed, shallow breathing, crescendo snoring. Nocturia. Feeling not restored,   Epworth score  3/ 24  Points , Fatigue severity score 25/ 63 points/depression score.   Social History  Socioeconomic History  . Marital status: Married    Spouse name: Not on file  . Number of children: Not on file  . Years of education: Not on file  . Highest education level: Not on file  Occupational History  . Not on file  Social Needs  . Financial resource strain: Not on file  . Food insecurity:    Worry: Not on file    Inability: Not on file  . Transportation needs:    Medical: Not on file    Non-medical: Not on file  Tobacco Use  . Smoking status: Former Smoker    Packs/day: 0.25    Years: 18.00    Pack years: 4.50    Types: Cigarettes    Last attempt to quit: 1978     Years since quitting: 41.9  . Smokeless tobacco: Former Systems developer    Quit date: 02/09/1979  Substance and Sexual Activity  . Alcohol use: No  . Drug use: No  . Sexual activity: Not on file  Lifestyle  . Physical activity:    Days per week: Not on file    Minutes per session: Not on file  . Stress: Not on file  Relationships  . Social connections:    Talks on phone: Not on file    Gets together: Not on file    Attends religious service: Not on file    Active member of club or organization: Not on file    Attends meetings of clubs or organizations: Not on file    Relationship status: Not on file  . Intimate partner violence:    Fear of current or ex partner: Not on file    Emotionally abused: Not on file    Physically abused: Not on file    Forced sexual activity: Not on file  Other Topics Concern  . Not on file  Social History Narrative   Lives   Caffeine use:     Family History  Problem Relation Age of Onset  . Stroke Mother   . Heart attack Father   . Stroke Father   . Kidney disease Brother     Past Medical History:  Diagnosis Date  . Cancer ([place]) 07/2012   prostate  . Heart disease   . Hypertension     Past Surgical History:  Procedure Laterality Date  . COLONOSCOPY N/A 08/28/2014   Procedure: COLONOSCOPY;  Surgeon: Aviva Signs, MD;  [facility];  Service: Gastroenterology;  Laterality: N/A;  . INTRAVASCULAR PRESSURE WIRE/FFR STUDY N/A 11/04/2017   Procedure: INTRAVASCULAR PRESSURE WIRE/DFR vs FFR  STUDY;  Surgeon: Troy Sine, MD;  Location: [place] [facility];  Service: Cardiovascular;  Laterality: N/A;  . LEFT HEART CATH AND CORONARY ANGIOGRAPHY N/A 11/04/2017   Procedure: LEFT HEART CATH AND CORONARY ANGIOGRAPHY;  Surgeon: Troy Sine, MD;  Location: [place] [facility];  Service: Cardiovascular;  Laterality: N/A;  . PROSTATE SURGERY  12/2012  . SHOULDER SURGERY Bilateral     Current Outpatient Medications  Medication Sig Dispense Refill   . amLODipine (NORVASC) 10 MG tablet Take 1 tablet (10 mg total) by mouth daily. 90 tablet 4  . aspirin 81 MG tablet Take 81 mg by mouth daily.    Marland Kitchen atorvastatin (LIPITOR) 80 MG tablet Take 1 tablet (80 mg total) by mouth daily. 90 tablet 4  . isosorbide mononitrate (IMDUR) 30 MG 24 hr tablet Take 1 tablet (30 mg total) by mouth daily. 90 tablet 4  . losartan (COZAAR)  100 MG tablet Take 1 tablet (100 mg total) by mouth daily. 90 tablet 4  . Omega-3 1000 MG CAPS Take 1 capsule by mouth daily as needed.      No current facility-administered medications for this visit.     Allergies as of 01/10/2018 - Review Complete 01/10/2018  Allergen Reaction Noted  . Penicillins  08/16/2014    Vitals: BP (!) 150/78 (BP Location: Right Arm, Patient Position: Sitting)   Pulse 60   Ht 5\' 6"  (1.676 m)   Wt 207 lb (93.9 kg)   BMI 33.41 kg/m  Last Weight:  Wt Readings from Last 1 Encounters:  01/10/18 207 lb (93.9 kg)   HKV:QQVZ mass index is 33.41 kg/m.     Last Height:   Ht Readings from Last 1 Encounters:  01/10/18 5\' 6"  (1.676 m)    Physical exam:  General: The patient is awake, alert and appears not in acute distress. The patient is well groomed. Head: Normocephalic, atraumatic. Neck is supple.  Mallampati 3- short neck .   Neck circumference: 17.5"   Nasal airflow congested - Retrognathia is seen.  Cardiovascular:  Regular rate and rhythm , without  murmurs or carotid bruit, and without distended neck veins. Respiratory: Lungs are clear to auscultation. Skin:  Without evidence of edema, or rash Trunk: BMI is 33.4. The patient's posture is stooped.   Neurologic exam : The patient is awake and alert, oriented to place and time.   Memory subjective described as intact. Attention span & concentration ability appears normal.  Speech is fluent, without dysarthria, dysphonia or aphasia.  Mood and affect are appropriate.  Cranial nerves: Pupils are equal and briskly reactive to light.  Funduscopic exam without   evidence of pallor or edema. Extraocular movements  in vertical and horizontal planes intact and without nystagmus. Visual fields by finger perimetry are intact. Hearing to finger rub intact. Facial sensation intact to fine touch. Facial motor strength is symmetric and tongue and uvula move midline. Shoulder shrug was symmetrical.   Motor exam: Normal tone, muscle bulk and symmetric strength in all extremities.  Sensory:  Fine touch, pinprick and vibration were tested in all extremities. Proprioception tested in the upper extremities was normal.  Coordination: Rapid alternating movements in the fingers/hands was normal. Finger-to-nose maneuver normal without evidence of ataxia, dysmetria or tremor.  Gait and station: Patient walks without assistive device and is able unassisted to climb up to the exam table. Strength within normal limits.  Stance is stable and normal. Toe and heel stand were tested. Tandem gait is unfragmented. Turns with 4  Steps. Romberg testing is negative.  Deep tendon reflexes: in the upper and lower extremities are symmetric and intact.  Crossed reflexes. Babinski maneuver response is  downgoing.  Assessment:  After physical and neurologic examination, review of laboratory studies, Personal review of imaging studies, reports of other /same Imaging studies, results of polysomnography and / or neurophysiology testing and pre-existing records as far as provided in visit.,  My assessment is :   1) Nocturia , snoring and lack of restorative, refreshing sleep quality in an overweight gentleman with chronic nasal congestion.   2) SOB, rhinitis - mouth breather. He has very high BP and is not in pain, different on right versus left. OSA is likely.   3)  Patient may have some depression.    The patient was advised of the nature of the diagnosed disorder , the treatment options and the  risks for general health and  wellness arising from not treating the  condition.   I spent more than 45 minutes of face to face time with the patient.  Greater than 50% of time was spent in counseling and coordination of care. We have discussed the diagnosis and differential and I answered the patient's questions.    Plan:  Treatment plan and additional workup :  Sleep study to evaluate for OSA contributing to lower sleep quality, snoring and uncontrolled hypertension.   Larey Seat, MD 16/02/958, 4:54 PM  Certified in Neurology by ABPN Certified in [place] by [facility] [address] [place], [place] [address]

## 2018-02-07 ENCOUNTER — Ambulatory Visit (INDEPENDENT_AMBULATORY_CARE_PROVIDER_SITE_OTHER): Payer: Medicare Other | Admitting: Neurology

## 2018-02-07 DIAGNOSIS — G4733 Obstructive sleep apnea (adult) (pediatric): Secondary | ICD-10-CM

## 2018-02-07 DIAGNOSIS — M7542 Impingement syndrome of left shoulder: Secondary | ICD-10-CM

## 2018-02-07 DIAGNOSIS — R0683 Snoring: Secondary | ICD-10-CM

## 2018-02-07 DIAGNOSIS — I2510 Atherosclerotic heart disease of native coronary artery without angina pectoris: Secondary | ICD-10-CM

## 2018-02-07 DIAGNOSIS — I200 Unstable angina: Secondary | ICD-10-CM

## 2018-02-07 DIAGNOSIS — I2583 Coronary atherosclerosis due to lipid rich plaque: Secondary | ICD-10-CM

## 2018-02-07 DIAGNOSIS — G4734 Idiopathic sleep related nonobstructive alveolar hypoventilation: Principal | ICD-10-CM

## 2018-02-14 ENCOUNTER — Other Ambulatory Visit (HOSPITAL_COMMUNITY)
Admission: RE | Admit: 2018-02-14 | Discharge: 2018-02-14 | Disposition: A | Discharge status: Home / Self Care | Payer: Medicare Other | Source: Ambulatory Visit | Attending: Cardiovascular Disease | Admitting: Cardiovascular Disease

## 2018-02-14 DIAGNOSIS — Z79899 Other long term (current) drug therapy: Secondary | ICD-10-CM | POA: Insufficient documentation

## 2018-02-14 LAB — LIPID PANEL
CHOL/HDL RATIO: 2.3 ratio
CHOLESTEROL: 119 mg/dL (ref 0–200)
HDL: 51 mg/dL (ref >40)
LDL Cholesterol: 50 mg/dL (ref 0–99)
TRIGLYCERIDES: 88 mg/dL (ref <150)
VLDL: 18 mg/dL (ref 0–40)

## 2018-02-17 DIAGNOSIS — L570 Actinic keratosis: Secondary | ICD-10-CM | POA: Diagnosis not present

## 2018-02-17 DIAGNOSIS — L821 Other seborrheic keratosis: Secondary | ICD-10-CM | POA: Diagnosis not present

## 2018-02-17 DIAGNOSIS — D225 Melanocytic nevi of trunk: Secondary | ICD-10-CM | POA: Diagnosis not present

## 2018-02-17 DIAGNOSIS — X32XXXA Exposure to sunlight, initial encounter: Secondary | ICD-10-CM | POA: Diagnosis not present

## 2018-02-21 ENCOUNTER — Encounter: Payer: Self-pay | Admitting: Cardiovascular Disease

## 2018-02-21 ENCOUNTER — Ambulatory Visit: Payer: Medicare Other | Admitting: Cardiovascular Disease

## 2018-02-21 VITALS — BP 140/74 | HR 64 | Ht 66.0 in | Wt 210.0 lb

## 2018-02-21 DIAGNOSIS — I498 Other specified cardiac arrhythmias: Secondary | ICD-10-CM | POA: Diagnosis not present

## 2018-02-21 DIAGNOSIS — I10 Essential (primary) hypertension: Secondary | ICD-10-CM | POA: Diagnosis not present

## 2018-02-21 DIAGNOSIS — E785 Hyperlipidemia, unspecified: Secondary | ICD-10-CM | POA: Diagnosis not present

## 2018-02-21 DIAGNOSIS — R001 Bradycardia, unspecified: Secondary | ICD-10-CM

## 2018-02-21 DIAGNOSIS — G4730 Sleep apnea, unspecified: Secondary | ICD-10-CM

## 2018-02-21 DIAGNOSIS — I25118 Atherosclerotic heart disease of native coronary artery with other forms of angina pectoris: Secondary | ICD-10-CM

## 2018-02-21 DIAGNOSIS — E669 Obesity, unspecified: Secondary | ICD-10-CM

## 2018-02-21 NOTE — Patient Instructions (Signed)
Medication Instructions:  Your physician recommends that you continue on your current medications as directed. Please refer to the Current Medication list given to you today.  If you need a refill on your cardiac medications before your next appointment, please call your pharmacy.   Lab work: None tosday If you have labs (blood work) drawn today and your tests are completely normal, you will receive your results only by: Marland Kitchen MyChart Message (if you have MyChart) OR . A paper copy in the mail If you have any lab test that is abnormal or we need to change your treatment, we will call you to review the results.  Testing/Procedures: None today  Follow-Up: At [facility], you and your health needs are our priority.  As part of our continuing mission to provide you with exceptional heart care, we have created designated Provider Care Teams.  These Care Teams include your primary Cardiologist (physician) and Advanced Practice Providers (APPs -  Physician Assistants and Nurse Practitioners) who all work together to provide you with the care you need, when you need it. You will need a follow up appointment in 6 months.  Please call our office 2 months in advance to schedule this appointment.  You may see Kate Sable, MD or one of the following Advanced Practice Providers on your designated Care Team:   Bernerd Pho, PA-C [facility]) . Ermalinda Barrios, PA-C ([place])  Any Other Special Instructions Will Be Listed Below (If Applicable). none

## 2018-02-21 NOTE — Progress Notes (Signed)
SUBJECTIVE: The patient presents for routine follow-up.  In summary, coronary angiography on 11/04/2017 demonstrated the proximal LAD having an eccentric stenosis in the region of a large 4 branch diagonal vessel which in most views does not appear significantly narrowed but in the RAO cranial view appears at least 75%. FFR and DFR were discordant with mild positivity on DFR and negative FFR.   The initial plan is to attempt aggressive medical therapy with statins, amlodipine, and nitrates.  No beta blockers due to bradycardia and junctional rhythm.  If he continues to experience recurrent symptoms on optimal medical therapy, PCI will be considered.  Echocardiogram on 11/04/2017 demonstrated vigorous left ventricular systolic function, LVEF 65 to 70%, grade 2 diastolic dysfunction, elevated filling pressures, mild aortic regurgitation, and LVH.  Since I last saw him he saw Dr. Brett Fairy for evaluation of sleep apnea.  He recently underwent a sleep study.  I do not have the results.  The patient denies any symptoms of chest pain, palpitations, shortness of breath, lightheadedness, dizziness, leg swelling, orthopnea, PND, and syncope.  He has additional questions on how to improve his overall health of the next several months with regards to diet and exercise.   Review of Systems: As per "subjective", otherwise negative.  Allergies  Allergen Reactions  . Penicillins     Eye swelled shut  .Has patient had a PCN reaction causing immediate rash, facial/tongue/throat swelling, SOB or lightheadedness with hypotension: Yes Has patient had a PCN reaction causing severe rash involving mucus membranes or skin necrosis: No Has patient had a PCN reaction that required hospitalization: No Has patient had a PCN reaction occurring within the last 10 years: No If all of the above answers are "NO", then may proceed with Cephalosporin use    Current Outpatient Medications  Medication Sig Dispense  Refill  . amLODipine (NORVASC) 10 MG tablet Take 1 tablet (10 mg total) by mouth daily. 90 tablet 4  . aspirin 81 MG tablet Take 81 mg by mouth daily.    Marland Kitchen atorvastatin (LIPITOR) 80 MG tablet Take 1 tablet (80 mg total) by mouth daily. 90 tablet 4  . isosorbide mononitrate (IMDUR) 30 MG 24 hr tablet Take 1 tablet (30 mg total) by mouth daily. 90 tablet 4  . losartan (COZAAR) 100 MG tablet Take 1 tablet (100 mg total) by mouth daily. 90 tablet 4  . Omega-3 1000 MG CAPS Take 1 capsule by mouth daily as needed.      No current facility-administered medications for this visit.     Past Medical History:  Diagnosis Date  . Cancer ([place]) 07/2012   prostate  . Heart disease   . Hypertension     Past Surgical History:  Procedure Laterality Date  . COLONOSCOPY N/A 08/28/2014   Procedure: COLONOSCOPY;  Surgeon: Aviva Signs, MD;  [facility];  Service: Gastroenterology;  Laterality: N/A;  . INTRAVASCULAR PRESSURE WIRE/FFR STUDY N/A 11/04/2017   Procedure: INTRAVASCULAR PRESSURE WIRE/DFR vs FFR  STUDY;  Surgeon: Troy Sine, MD;  Location: [facility];  Service: Cardiovascular;  Laterality: N/A;  . LEFT HEART CATH AND CORONARY ANGIOGRAPHY N/A 11/04/2017   Procedure: LEFT HEART CATH AND CORONARY ANGIOGRAPHY;  Surgeon: Troy Sine, MD;  Location: [facility];  Service: Cardiovascular;  Laterality: N/A;  . PROSTATE SURGERY  12/2012  . SHOULDER SURGERY Bilateral     Social History   Socioeconomic History  . Marital status: Married  Spouse name: Not on file  . Number of children: Not on file  . Years of education: Not on file  . Highest education level: Not on file  Occupational History  . Not on file  Social Needs  . Financial resource strain: Not on file  . Food insecurity:    Worry: Not on file    Inability: Not on file  . Transportation needs:    Medical: Not on file    Non-medical: Not on file  Tobacco Use  . Smoking status: Former Smoker     Packs/day: 0.25    Years: 18.00    Pack years: 4.50    Types: Cigarettes    Last attempt to quit: 1978    Years since quitting: 42.0  . Smokeless tobacco: Former Systems developer    Quit date: 02/09/1979  Substance and Sexual Activity  . Alcohol use: No  . Drug use: No  . Sexual activity: Not on file  Lifestyle  . Physical activity:    Days per week: Not on file    Minutes per session: Not on file  . Stress: Not on file  Relationships  . Social connections:    Talks on phone: Not on file    Gets together: Not on file    Attends religious service: Not on file    Active member of club or organization: Not on file    Attends meetings of clubs or organizations: Not on file    Relationship status: Not on file  . Intimate partner violence:    Fear of current or ex partner: Not on file    Emotionally abused: Not on file    Physically abused: Not on file    Forced sexual activity: Not on file  Other Topics Concern  . Not on file  Social History Narrative   Lives   Caffeine use:      Vitals:   02/21/18 1042  BP: 140/74  Pulse: 64  SpO2: 97%  Weight: 210 lb (95.3 kg)  Height: 5\' 6"  (1.676 m)    Wt Readings from Last 3 Encounters:  02/21/18 210 lb (95.3 kg)  01/10/18 207 lb (93.9 kg)  11/12/17 209 lb (94.8 kg)     PHYSICAL EXAM General: NAD HEENT: Normal. Neck: No JVD, no thyromegaly. Lungs: Clear to auscultation bilaterally with normal respiratory effort. CV: Regular rate and rhythm, normal S1/S2, no G2/X5, 2/6 systolic murmur loudest over RUSB. No pretibial or periankle edema.  No carotid bruit.   Abdomen: Soft, nontender, no distention.  Neurologic: Alert and oriented.  Psych: Normal affect. Skin: Normal. Musculoskeletal: No gross deformities.    ECG: Reviewed above under Subjective   Labs: Lab Results  Component Value Date/Time   K 3.6 11/05/2017 03:51 AM   BUN 15 11/05/2017 03:51 AM   CREATININE 0.96 11/05/2017 03:51 AM   ALT 73 (H) 11/04/2017 04:05 AM    TSH 2.867 11/05/2017 03:51 AM   HGB 12.6 (L) 11/05/2017 03:51 AM     Lipids: Lab Results  Component Value Date/Time   LDLCALC 50 02/14/2018 12:42 PM   CHOL 119 02/14/2018 12:42 PM   TRIG 88 02/14/2018 12:42 PM   HDL 51 02/14/2018 12:42 PM       ASSESSMENT AND PLAN:  1.  Coronary artery disease: Symptomatically stable.  Coronary angiography results detailed above.  He is on aspirin, amlodipine, atorvastatin, losartan, and Imdur.  If symptoms cannot be controlled on optimal medical therapy, PCI will be considered.  I spoke to  him about how to improve his overall health by way of diet and exercise.  2.  Hypertension: Blood pressure is mildly elevated.  He has already been referred for a sleep study.  He also needs weight loss.  No changes to medications at this time.  I spoke to him about how to improve his overall health by way of diet and exercise.  3.  Hyperlipidemia: Continue atorvastatin 80 mg.  LDL 50 on 02/14/2018.  4.  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  He has sleep disordered breathing.    He has been referred for a sleep study.  5.  Sleep disordered breathing: He is obese, has a history of snoring with fatigue, has nocturnal bradycardia with occasional junctional rhythm.  He is at high risk for obstructive sleep apnea.    He has been referred for a sleep study.  He completed it but I do not have the results.  6.  Obesity:  I spoke to him about how to improve his overall health by way of diet and exercise.   Disposition: Follow up 6 months   Kate Sable, M.D., F.A.C.C.

## 2018-02-22 ENCOUNTER — Telehealth: Payer: Self-pay | Admitting: Neurology

## 2018-02-22 NOTE — Procedures (Signed)
PATIENT'S NAME:  [name], [name] DOB:      1945-06-05      MR#:    [id number]     DATE OF RECORDING: 02/07/2018 REFERRING M.D.:  Dr. Bronson Ing, MD Cardiology Study Performed:   Baseline Polysomnogram HISTORY:  01-10-2018. Mr. Helder Crisafulli. Teichert is a 73 year old Caucasian gentleman who was recently evaluated for coronary artery disease. He has CAD and unstable angina.   He has almost every night trouble to breathe through his nose, reporting congestion of either both sides or one nostril at a time.  Even now as I speak here with him he clearly has to breathe through his mouth while we are holding interview.  He has high blood pressure, currently poorly controlled.  He is not in pain, reported neither headache, nor dizziness, nor palpitations.  The patient was seen for a coronary angiography on 04 November 2017 his proximal LAD has an eccentric stenosis and a 4 branch diagonal vessel which appeared narrowed.  75% narrowing was estimated.  The initial plan was to attempt aggressive medical therapy with statins, amlodipine and nitrates.  He has bradycardia and junctional rhythm and therefore was not placed based on calcium channel blockers or beta blockers.  An echocardiogram on the same day demonstrated vigorous left ventricular filling his ejection fraction was 65 to 70% -but he has a grade 2 diastolic dysfunction with an elevated filling pressure mild aortic regurgitation and left ventricular hypertrophy.  There is no report of atrial fibrillation.  The patient endorsed the Epworth Sleepiness Scale at 3/24 points, FSS at 25/63, the depression questionnaire was positive.   The patient's weight 207 pounds with a height of 66 (inches), resulting in a BMI of 33.3 kg/m2. The patient's neck circumference measured 17.5 inches.  CURRENT MEDICATIONS: Amlodipine, Aspirin, Atorvastatin, Isosorbide Mononitrate, Losartan and Omega-3.   PROCEDURE:  This is a multichannel digital polysomnogram utilizing the  Somnostar 11.2 system.  Electrodes and sensors were applied and monitored per AASM Specifications.   EEG, EOG, Chin and Limb EMG, were sampled at 200 Hz.  ECG, Snore and Nasal Pressure, Thermal Airflow, Respiratory Effort, CPAP Flow and Pressure, Oximetry was sampled at 50 Hz. Digital video and audio were recorded.      BASELINE STUDY: Lights Out was at 22:11 and Lights On at 05:01.  Total recording time (TRT) was 410.5 minutes, with a total sleep time (TST) of 260.5 minutes.   The patient's sleep latency was 47 minutes.  REM latency was 120.5 minutes.  The sleep efficiency was 63.5 %.     SLEEP ARCHITECTURE: WASO (Wake after sleep onset) was 129.5 minutes.  There were 21.5 minutes in Stage N1, 154 minutes Stage N2, 12.5 minutes Stage N3 and 72.5 minutes in Stage REM.  The percentage of Stage N1 was 8.3%, Stage N2 was 59.1%, Stage N3 was 4.8% and Stage R (REM sleep) was 27.8%.  RESPIRATORY ANALYSIS:  There were a total of 55 respiratory events:  2 obstructive apneas, 0 central apneas and 0 mixed apneas with a total of 2 apneas and an apnea index (AI) of .5 /hour. There were 53 hypopneas with a hypopnea index of 12.2 /hour. The patient also had few respiratory event related arousals (RERAs).      The total APNEA/HYPOPNEA INDEX (AHI) was 12.7 /hour and the total RESPIRATORY DISTURBANCE INDEX was 12.7 /hour.   39 events occurred in REM sleep and 30 events in NREM. The REM AHI was 32.3 /hour, versus a non-REM AHI of 5.1. The patient  spent 32.5 minutes of total sleep time in the supine position and 228 minutes in non-supine. The supine AHI was 18.5/h versus a non-supine AHI of 11.8/h.  OXYGEN SATURATION & C02:  The Wake baseline 02 saturation was 92%, with the lowest being 80%. Time spent below 89% saturation equaled 170 minutes.   PERIODIC LIMB MOVEMENTS:   The patient had a total of 0 Periodic Limb Movements. The arousals were noted as: 23 were spontaneous, 0 were associated with PLMs, and 56 were  associated with respiratory events.  Audio and video analysis did not show any abnormal or unusual movements, behaviors, phonations or vocalizations.     EKG was in keeping with sinus rhythm (NSR) and few prolonged R to R intervals on single channel EGK.Marland Kitchen  Post-study, the patient indicated that sleep was the same as usual, 4 to 5 arousals, sleeping 5 hours.    IMPRESSION:  1. Mild Obstructive Sleep Apnea (OSA) at AHI 12.7 with prolonged hypoxemia in REM sleep. REM AHI was 32.3/h and 170 minutes of hypoxemia were measured.   RECOMMENDATIONS: Advise full-night, attended, CPAP titration study to optimize therapy.     I certify that I have reviewed the entire raw data recording prior to the issuance of this report in accordance with the Standards of Accreditation of the American Academy of Sleep Medicine (AASM)     Larey Seat, MD    02-21-2018 Diplomat, American Board of Psychiatry and Neurology  Diplomat, American Board of [place] Director, [facility] at [employer]

## 2018-02-22 NOTE — Telephone Encounter (Signed)

## 2018-02-22 NOTE — Addendum Note (Signed)
Addended by: Larey Seat on: 02/22/2018 08:40 AM   Modules accepted: Orders

## 2018-03-02 DIAGNOSIS — I10 Essential (primary) hypertension: Secondary | ICD-10-CM | POA: Diagnosis not present

## 2018-03-02 DIAGNOSIS — Z0001 Encounter for general adult medical examination with abnormal findings: Secondary | ICD-10-CM | POA: Diagnosis not present

## 2018-03-02 DIAGNOSIS — Z1389 Encounter for screening for other disorder: Secondary | ICD-10-CM | POA: Diagnosis not present

## 2018-03-02 DIAGNOSIS — R7309 Other abnormal glucose: Secondary | ICD-10-CM | POA: Diagnosis not present

## 2018-03-13 ENCOUNTER — Ambulatory Visit (INDEPENDENT_AMBULATORY_CARE_PROVIDER_SITE_OTHER): Payer: Medicare Other | Admitting: Neurology

## 2018-03-13 DIAGNOSIS — G4733 Obstructive sleep apnea (adult) (pediatric): Secondary | ICD-10-CM

## 2018-03-13 DIAGNOSIS — I200 Unstable angina: Secondary | ICD-10-CM

## 2018-03-13 DIAGNOSIS — M7542 Impingement syndrome of left shoulder: Secondary | ICD-10-CM

## 2018-03-13 DIAGNOSIS — R0683 Snoring: Secondary | ICD-10-CM

## 2018-03-13 DIAGNOSIS — I2510 Atherosclerotic heart disease of native coronary artery without angina pectoris: Secondary | ICD-10-CM

## 2018-03-13 DIAGNOSIS — I2583 Coronary atherosclerosis due to lipid rich plaque: Secondary | ICD-10-CM

## 2018-03-13 DIAGNOSIS — G4734 Idiopathic sleep related nonobstructive alveolar hypoventilation: Secondary | ICD-10-CM

## 2018-03-13 DIAGNOSIS — R063 Periodic breathing: Secondary | ICD-10-CM

## 2018-03-21 DIAGNOSIS — G4733 Obstructive sleep apnea (adult) (pediatric): Secondary | ICD-10-CM | POA: Insufficient documentation

## 2018-03-21 DIAGNOSIS — G4734 Idiopathic sleep related nonobstructive alveolar hypoventilation: Secondary | ICD-10-CM

## 2018-03-21 DIAGNOSIS — R063 Periodic breathing: Secondary | ICD-10-CM | POA: Insufficient documentation

## 2018-03-21 NOTE — Addendum Note (Signed)
Addended by: Larey Seat on: 03/21/2018 05:45 PM   Modules accepted: Orders

## 2018-03-21 NOTE — Procedures (Signed)
PATIENT'S NAME:  [name], [name] DOB:      1945/06/19      MR#:    [id number]     DATE OF RECORDING: 03/13/2018  AL  REFERRING M.D.:  Bronson Ing, MD  Study Performed:   Titration to positive airway pressure.  HISTORY:  [name] is returning for CPAP titration following his PSG which was performed on 02/07/2018 and resulted in a diagnosis of OSA, with obstructive AHI of 12.7/h,  and prolonged oxygen desaturation of 170 minutes total duration- strongly associated with REM sleep, REM AHI was 32.3/h, the SpO2 nadir was 80%.  The patient endorsed the Epworth Sleepiness Scale at 3 points and the Fatigue Score at 25 points.   The patient's weight 207 pounds with a height of 66 (inches), resulting in a BMI of 33.3 kg/m2. The patient's neck circumference measured 17 inches.  CURRENT MEDICATIONS: Amlodipine, Aspirin, Atorvastatin, Isosorbide Mononitrate, Losartan and Omega-3.   PROCEDURE:  This is a multichannel digital polysomnogram utilizing the SomnoStar 11.2 system.  Electrodes and sensors were applied and monitored per AASM Specifications.   EEG, EOG, Chin and Limb EMG, were sampled at 200 Hz.  ECG, Snore and Nasal Pressure, Thermal Airflow, Respiratory Effort, CPAP Flow and Pressure, Oximetry was sampled at 50 Hz. Digital video and audio were recorded.      CPAP was initiated at 5 cmH20 with heated humidity per AASM split night standards and pressure was advanced to 10 cmH20 because of hypopneas, apneas and desaturations. Hypoxemia was still noted. The technician changed to [place] and finally to ASV.    At ASV pressures of 10/6/4 cmH20, there was a reduction of the AHI to 0.0/h with improvement of sleep Apnea under the use of N 10 nasal pillows, but only 7.5 minutes of sleep were recorded.  The highest sleep efficiency under ASV was at 10/5/4 cm water but with an AHI of 8.3/h.   Lights Out was at 22:19 and Lights On at 04:19. Total recording time (TRT) was 361 minutes, with a total sleep time  (TST) of 207 minutes. The patient's sleep latency was 53 minutes. REM latency was 84.5 minutes. The sleep efficiency was low at 57.3 %.    SLEEP ARCHITECTURE: WASO (Wake after sleep onset) was 41 minutes.  There were 34 minutes in Stage N1, 39.5 minutes Stage N2, 92.5 minutes Stage N3 and 41 minutes in Stage REM.  The percentage of Stage N1 was 16.4%, Stage N2 was 19.1%, Stage N3 was 44.7% and Stage R (REM sleep) was 19.8%.   RESPIRATORY ANALYSIS:  There was a total of 105 respiratory events: 0 obstructive apneas, 93 central apneas and 0 mixed apneas with a total of 93 apneas and an apnea index (AI) of 27. /hour. There were 12 hypopneas with a hypopnea index of 3.5/hour. The patient also had 1 respiratory event related arousals (RERAs).      The total APNEA/HYPOPNEA INDEX  (AHI) was 30.4 /hour and the total RESPIRATORY DISTURBANCE INDEX was 30.7 /hour  11 events occurred in REM sleep and 94 events in NREM. The REM AHI was 16.1 /hour versus a non-REM AHI of 34. /hour.  The patient spent 104 minutes of total sleep time in the supine position and 103 minutes in non-supine. The supine AHI was 24.2, versus a non-supine AHI of 36.7.  OXYGEN SATURATION & C02:  The baseline 02 saturation was 94%, with the lowest being 81%. Time spent below 89% saturation equaled still 106 minutes.  PERIODIC LIMB MOVEMENTS: none.  The arousals were noted as: 21 were spontaneous, 0 were associated with PLMs, and 8 were associated with respiratory events.  Audio and video analysis did not show any abnormal or unusual movements, behaviors, phonations or vocalizations.   The patient took one bathroom breaks. Snoring was noted. EKG was in keeping with normal sinus rhythm (NSR).   DIAGNOSIS 1. 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 there was only a very brief time under ASV noted under which AHI became 0.0/h, the ASV setting was max pressure support of 10 cm / min  pressure support of 6 cm/ and EEP at 4 cm water.  2. 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  PLANS/RECOMMENDATIONS: The patient was fitted with ResMed AirFit P10 medium sized pillows. ASV setting was max pressure support of 10 cm / min pressure support of 6 cm/ and EEP at 4 cm water. Post-study, the patient indicated that he did not like CPAP, BiPAP or ASV.      DISCUSSION: Will start ASV as stated above, unfortunately no REM sleep was recorded under that setting, overall sleep time was only 7.5 minutes and no oxygen applied. The patient was fitted with ResMed AirFit P10 medium sized pillows. Post-study, the patient indicated that he did not like CPAP, BiPAP or ASV.    A follow up appointment will be scheduled in the Sleep Clinic at [facility].   Please call [phone] with any questions.      I certify that I have reviewed the entire raw data recording prior to the issuance of this report in accordance with the Standards of Accreditation of the American Academy of Sleep Medicine (AASM)   Larey Seat, M.D.   03-21-2018 Diplomat, American Board of Psychiatry and Neurology  Diplomat, [place] of Sleep Medicine Medical Director, [place] [facility] at [facility]

## 2018-03-22 ENCOUNTER — Telehealth: Payer: Self-pay | Admitting: Neurology

## 2018-03-22 NOTE — Telephone Encounter (Signed)
-----   Message from Larey Seat, MD sent at 03/21/2018  5:45 PM EST ----- PLANS/RECOMMENDATIONS: The patient was fitted with ResMed AirFit  P10 medium sized pillows. ASV setting was max pressure support of  10 cm / min pressure support of 6 cm/ and EEP at 4 cm water. Post-study, the patient indicated that he did not like CPAP,  BiPAP or ASV.    DISCUSSION: Will start ASV as stated above, unfortunately no REM  sleep was recorded under that setting, overall sleep time was  only 7.5 minutes and no oxygen applied. The patient was fitted  with ResMed AirFit P10 medium sized pillows. Post-study, the  patient indicated that he did not like CPAP, BiPAP or ASV.

## 2018-03-22 NOTE — Telephone Encounter (Signed)
I called pt. I advised pt that Dr. Brett Fairy reviewed their sleep study results and found that pt was able to be treated with ASV machine. Dr. Brett Fairy recommends that pt starts ASV. I reviewed PAP compliance expectations with the pt. Pt is agreeable to starting a CPAP. I advised pt that an order will be sent to a DME, [facility], and [facility] will call the pt within about one week after they file with the pt's insurance. [facility] will show the pt how to use the machine, fit for masks, and troubleshoot the CPAP if needed. A follow up appt was made for insurance purposes with Ward Givens, NP on April 27,2020 at 3:00 pm. Pt verbalized understanding to arrive 15 minutes early and bring their CPAP. A letter with all of this information in it will be mailed to the pt as a reminder. I verified with the pt that the address we have on file is correct. Pt verbalized understanding of results. Pt had no questions at this time but was encouraged to call back if questions arise. I have sent the order to [facility] and have received confirmation that they have received the order.

## 2018-04-04 NOTE — Telephone Encounter (Signed)
Received a email from [facility] advising the pt decided to not start treatment.  "I have spoke with this patient this morning and he advised that he as decided to not go through with getting his ASV machine."

## 2018-06-06 ENCOUNTER — Ambulatory Visit: Payer: Self-pay | Admitting: Adult Health

## 2018-11-24 ENCOUNTER — Other Ambulatory Visit: Payer: Self-pay | Admitting: Cardiology

## 2018-11-24 ENCOUNTER — Other Ambulatory Visit: Payer: Self-pay

## 2018-11-24 NOTE — Patient Outreach (Signed)
[facility] [facility]) Care Management  11/24/2018  [name] 04-14-1945 [id number]   Medication Adherence call to [name] spoke with patients daughter she explain patient is on vacation until next month to call back then. [name] is showing past due on Atorvastatin 80 mg under [place].   [place] Management Direct Dial [phone]  Fax [phone] [email or web address]

## 2018-12-26 ENCOUNTER — Other Ambulatory Visit: Payer: Self-pay

## 2018-12-26 NOTE — Patient Outreach (Signed)
[facility]) Care Management  12/26/2018  AKIR WILMES June 02, 1945 [id number]   Medication Adherence call to Mr. [place] Compliant Voice message left with a call back number. Mr. Hodgson is showing past due on Atorvastatin 80 mg under [place].   [place] Management Direct Dial [phone]  Fax [phone] [email or web address]

## 2018-12-27 ENCOUNTER — Other Ambulatory Visit: Payer: Self-pay | Admitting: Cardiology

## 2019-01-16 DIAGNOSIS — E7849 Other hyperlipidemia: Secondary | ICD-10-CM | POA: Diagnosis not present

## 2019-01-16 DIAGNOSIS — R7309 Other abnormal glucose: Secondary | ICD-10-CM | POA: Diagnosis not present

## 2019-01-16 DIAGNOSIS — I10 Essential (primary) hypertension: Secondary | ICD-10-CM | POA: Diagnosis not present

## 2019-01-16 DIAGNOSIS — R011 Cardiac murmur, unspecified: Secondary | ICD-10-CM | POA: Diagnosis not present

## 2019-01-26 DIAGNOSIS — B880 Other acariasis: Secondary | ICD-10-CM | POA: Diagnosis not present

## 2019-01-26 DIAGNOSIS — L570 Actinic keratosis: Secondary | ICD-10-CM | POA: Diagnosis not present

## 2019-01-26 DIAGNOSIS — X32XXXD Exposure to sunlight, subsequent encounter: Secondary | ICD-10-CM | POA: Diagnosis not present

## 2019-01-26 DIAGNOSIS — L821 Other seborrheic keratosis: Secondary | ICD-10-CM | POA: Diagnosis not present

## 2019-01-26 DIAGNOSIS — D225 Melanocytic nevi of trunk: Secondary | ICD-10-CM | POA: Diagnosis not present

## 2019-01-27 ENCOUNTER — Other Ambulatory Visit: Payer: Self-pay | Admitting: Cardiovascular Disease

## 2019-01-27 MED ORDER — ISOSORBIDE MONONITRATE ER 30 MG PO TB24: 30.0000 mg | ORAL_TABLET | Freq: Every day | ORAL | 1 refills | Status: DC

## 2019-01-27 MED ORDER — OMEGA-3 1000 MG PO CAPS: 1.0000 | ORAL_CAPSULE | Freq: Every day | ORAL | 1 refills | Status: DC | PRN

## 2019-01-27 MED ORDER — ATORVASTATIN CALCIUM 80 MG PO TABS: 80.0000 mg | ORAL_TABLET | Freq: Every day | ORAL | 1 refills | Status: DC

## 2019-01-27 MED ORDER — LOSARTAN POTASSIUM 100 MG PO TABS: 100.0000 mg | ORAL_TABLET | Freq: Every day | ORAL | 1 refills | Status: DC

## 2019-01-27 MED ORDER — AMLODIPINE BESYLATE 10 MG PO TABS: 10.0000 mg | ORAL_TABLET | Freq: Every day | ORAL | 1 refills | Status: DC

## 2019-01-27 NOTE — Telephone Encounter (Signed)
[facility] called to tell us that patient is switching pharmacies to [facility] in [place] all cardiac medication to be sent   Please call Caren Griffins at [phone] to confirm this has been completed

## 2019-01-27 NOTE — Telephone Encounter (Signed)
Transferred to [facility] pharmacy per request

## 2019-02-09 DIAGNOSIS — E7849 Other hyperlipidemia: Secondary | ICD-10-CM | POA: Diagnosis not present

## 2019-02-09 DIAGNOSIS — I10 Essential (primary) hypertension: Secondary | ICD-10-CM | POA: Diagnosis not present

## 2019-02-09 DIAGNOSIS — I2510 Atherosclerotic heart disease of native coronary artery without angina pectoris: Secondary | ICD-10-CM | POA: Diagnosis not present

## 2019-02-14 DIAGNOSIS — J019 Acute sinusitis, unspecified: Secondary | ICD-10-CM | POA: Diagnosis not present

## 2019-02-14 DIAGNOSIS — Z03818 Encounter for observation for suspected exposure to other biological agents ruled out: Secondary | ICD-10-CM | POA: Diagnosis not present

## 2019-02-15 DIAGNOSIS — E7849 Other hyperlipidemia: Secondary | ICD-10-CM | POA: Diagnosis not present

## 2019-02-23 ENCOUNTER — Telehealth (INDEPENDENT_AMBULATORY_CARE_PROVIDER_SITE_OTHER): Payer: Medicare Other | Admitting: Cardiovascular Disease

## 2019-02-23 ENCOUNTER — Encounter: Payer: Self-pay | Admitting: Cardiovascular Disease

## 2019-02-23 VITALS — BP 145/78 | HR 68 | Ht 66.0 in | Wt 200.0 lb

## 2019-02-23 DIAGNOSIS — E669 Obesity, unspecified: Secondary | ICD-10-CM

## 2019-02-23 DIAGNOSIS — I498 Other specified cardiac arrhythmias: Secondary | ICD-10-CM

## 2019-02-23 DIAGNOSIS — I10 Essential (primary) hypertension: Secondary | ICD-10-CM

## 2019-02-23 DIAGNOSIS — I25118 Atherosclerotic heart disease of native coronary artery with other forms of angina pectoris: Secondary | ICD-10-CM

## 2019-02-23 DIAGNOSIS — R001 Bradycardia, unspecified: Secondary | ICD-10-CM

## 2019-02-23 DIAGNOSIS — G4731 Primary central sleep apnea: Secondary | ICD-10-CM

## 2019-02-23 DIAGNOSIS — E785 Hyperlipidemia, unspecified: Secondary | ICD-10-CM

## 2019-02-23 MED ORDER — ROSUVASTATIN CALCIUM 5 MG PO TABS: 5.0000 mg | ORAL_TABLET | Freq: Every day | ORAL | 3 refills | Status: DC

## 2019-02-23 NOTE — Patient Instructions (Signed)
Medication Instructions:  STOP LIPITOR START CRESTOR 5 MG DAILY   Labwork: NONE  Testing/Procedures: NONE  Follow-Up: Your physician recommends that you schedule a follow-up appointment in: 6 MONTHS    Any Other Special Instructions Will Be Listed Below (If Applicable).     If you need a refill on your cardiac medications before your next appointment, please call your pharmacy.

## 2019-02-23 NOTE — Progress Notes (Signed)
Virtual Visit via Telephone Note   This visit type was conducted due to national recommendations for restrictions regarding the COVID-19 Pandemic (e.g. social distancing) in an effort to limit this patient's exposure and mitigate transmission in our community.  Due to his co-morbid illnesses, this patient is at least at moderate risk for complications without adequate follow up.  This format is felt to be most appropriate for this patient at this time.  The patient did not have access to video technology/had technical difficulties with video requiring transitioning to audio format only (telephone).  All issues noted in this document were discussed and addressed.  No physical exam could be performed with this format.  Please refer to the patient's chart for his  consent to telehealth for [facility].   Date:  02/23/2019   ID:  [name], DOB [birth year], MRN [id number]  Patient Location: Home Provider Location: Home  PCP:  Sharilyn Sites, MD  Cardiologist:  Kate Sable, MD  Electrophysiologist:  None   Evaluation Performed:  Follow-Up Visit  Chief Complaint:  CAD  History of Present Illness:    CEAZAR BOGIE is a 74 y.o. male with CAD.  In summary, coronary angiography on 11/04/2017 demonstrated the proximal LAD having an eccentric stenosis in the region of a large 4 branch diagonal vessel which in most views does not appear significantly narrowed but in the RAO cranial view appears at least 75%. FFR and DFR were discordant with mild positivity on DFR and negative FFR.   The initial plan is to attempt aggressive medical therapy with statins, amlodipine, and nitrates.No beta blockers due to bradycardia and junctional rhythm. If he continues to experience recurrent symptoms on optimal medical therapy, PCI will be considered.  Echocardiogram on 11/04/2017 demonstrated vigorous left ventricular systolic function, LVEF 65 to [id number], grade 2 diastolic dysfunction, elevated  filling pressures, mild aortic regurgitation, and LVH.  The patient denies any symptoms of chest pain, palpitations, lightheadedness, dizziness, leg swelling, orthopnea, PND, and syncope.  He has some mild shortness of breath on occasion when walking uphill to his mailbox. This is seldom in occurrence. He denies fevers and chills.  He said he feels "yucky" after taking atorvastatin.  He had questions about his cardiac catheterization.   Past Medical History:  Diagnosis Date  . Cancer ([place]) 07/2012   prostate  . Heart disease   . Hypertension    Past Surgical History:  Procedure Laterality Date  . COLONOSCOPY N/A 08/28/2014   Procedure: COLONOSCOPY;  Surgeon: Aviva Signs, MD;  [facility];  Service: Gastroenterology;  Laterality: N/A;  . INTRAVASCULAR PRESSURE WIRE/FFR STUDY N/A 11/04/2017   Procedure: INTRAVASCULAR PRESSURE WIRE/DFR vs FFR  STUDY;  Surgeon: Troy Sine, MD;  Location: [facility];  Service: Cardiovascular;  Laterality: N/A;  . LEFT HEART CATH AND CORONARY ANGIOGRAPHY N/A 11/04/2017   Procedure: LEFT HEART CATH AND CORONARY ANGIOGRAPHY;  Surgeon: Troy Sine, MD;  Location: [facility];  Service: Cardiovascular;  Laterality: N/A;  . PROSTATE SURGERY  12/2012  . SHOULDER SURGERY Bilateral      Current Meds  Medication Sig  . amLODipine (NORVASC) 10 MG tablet Take 1 tablet (10 mg total) by mouth daily.  Marland Kitchen aspirin 81 MG tablet Take 81 mg by mouth daily.  Marland Kitchen atorvastatin (LIPITOR) 80 MG tablet Take 1 tablet (80 mg total) by mouth daily. (Patient taking differently: Take 40 mg by mouth daily. )  . isosorbide mononitrate (IMDUR) 30 MG  24 hr tablet Take 1 tablet (30 mg total) by mouth daily.  Marland Kitchen losartan (COZAAR) 100 MG tablet Take 1 tablet (100 mg total) by mouth daily.  . [DISCONTINUED] Omega-3 1000 MG CAPS Take 1 capsule (1,000 mg total) by mouth daily as needed.     Allergies:   Penicillins   Social History   Tobacco Use  .  Smoking status: Former Smoker    Packs/day: 0.25    Years: 18.00    Pack years: 4.50    Types: Cigarettes    Quit date: 1978    Years since quitting: 43.0  . Smokeless tobacco: Former Systems developer    Quit date: 02/09/1979  Substance Use Topics  . Alcohol use: No  . Drug use: No     Family Hx: The patient's family history includes Heart attack in his father; Kidney disease in his brother; Stroke in his father and mother.  ROS:   Please see the history of present illness.     All other systems reviewed and are negative.   Prior CV studies:   The following studies were reviewed today:  Reviewed above  Labs/Other Tests and Data Reviewed:    EKG:  No ECG reviewed.  Recent Labs: No results found for requested labs within last 8760 hours.   Recent Lipid Panel Lab Results  Component Value Date/Time   CHOL 119 02/14/2018 12:42 PM   TRIG 88 02/14/2018 12:42 PM   HDL 51 02/14/2018 12:42 PM   CHOLHDL 2.3 02/14/2018 12:42 PM   LDLCALC 50 02/14/2018 12:42 PM    Wt Readings from Last 3 Encounters:  02/23/19 200 lb (90.7 kg)  02/21/18 210 lb (95.3 kg)  01/10/18 207 lb (93.9 kg)     Objective:    Vital Signs:  BP (!) 145/78   Pulse 68   Ht 5\' 6"  (1.676 m)   Wt 200 lb (90.7 kg)   BMI 32.28 kg/m    VITAL SIGNS:  reviewed  ASSESSMENT & PLAN:    1. Coronary artery disease: Symptomatically stable. Coronary angiography results detailed above. He is on aspirin, amlodipine, losartan, and Imdur. He has not tolerated atorvastatin 40 or 80 mg. I will try Crestor 5 mg.If symptoms cannot be controlled on optimal medical therapy, PCI will be considered.  I previously spoke to him about how to improve his overall health by way of diet and exercise.  2. Hypertension: Blood pressure is mildly elevated. No changes to medications at this time.  I previously spoke to him about how to improve his overall health by way of diet and exercise.  3. Hyperlipidemia: He has not tolerated  atorvastatin 40 or 80 mg. I will try Crestor 5 mg.  4. 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 He has central sleep apnea.  5. Central sleep apnea: Follows with neurology.  6. Obesity:  I previously spoke to him about how to improve his overall health by way of diet and exercise.   COVID-19 Education: The signs and symptoms of COVID-19 were discussed with the patient and how to seek care for testing (follow up with PCP or arrange E-visit).  The importance of social distancing was discussed today.  Time:   Today, I have spent 15 minutes with the patient with telehealth technology discussing the above problems.     Medication Adjustments/Labs and Tests Ordered: Current medicines are reviewed at length with the patient today.  Concerns regarding medicines are outlined above.   Tests Ordered: No orders of the defined types were  placed in this encounter.   Medication Changes: No orders of the defined types were placed in this encounter.   Follow Up:  Virtual Visit  in 6 month(s)  Signed, Kate Sable, MD  02/23/2019 10:14 AM    [place]

## 2019-02-23 NOTE — Addendum Note (Signed)
Addended by: Debbora Lacrosse R on: 02/23/2019 11:47 AM   Modules accepted: Orders

## 2019-03-12 DIAGNOSIS — I2510 Atherosclerotic heart disease of native coronary artery without angina pectoris: Secondary | ICD-10-CM | POA: Diagnosis not present

## 2019-03-12 DIAGNOSIS — I10 Essential (primary) hypertension: Secondary | ICD-10-CM | POA: Diagnosis not present

## 2019-03-12 DIAGNOSIS — E7849 Other hyperlipidemia: Secondary | ICD-10-CM | POA: Diagnosis not present

## 2019-04-10 ENCOUNTER — Other Ambulatory Visit: Payer: Self-pay | Admitting: Cardiology

## 2019-04-11 ENCOUNTER — Other Ambulatory Visit: Payer: Self-pay

## 2019-04-11 NOTE — Patient Outreach (Signed)
[facility]) Care Management  04/11/2019  KOAN HAGGSTROM [birth year] [id number]   Medication Adherence call to Mr. [place] spoke with patient he is past due on Losartan 100 mg,patient explain he takes 1 tablet daily and has enough for 10 more days,patient explain he wants to start receiving it thru [facility] and when ready he will have [facility] transfer his refills patient has enough for 10 more days. Mr. Isobe is showing past due under [place].   [facility] Management Direct Dial [phone]  Fax [phone] [email or web address]

## 2019-06-07 ENCOUNTER — Other Ambulatory Visit: Payer: Self-pay

## 2019-06-07 MED ORDER — LOSARTAN POTASSIUM 100 MG PO TABS: 100.0000 mg | ORAL_TABLET | Freq: Every day | ORAL | 1 refills | Status: DC

## 2019-06-07 NOTE — Telephone Encounter (Signed)
Refill complete 

## 2019-06-07 NOTE — Telephone Encounter (Signed)
Pt is requesting prescriptions be called to [facility]. Losartan Pt no longer want to use [facility].   Please call if needed [phone]   Thanks renee

## 2019-06-09 DIAGNOSIS — E7849 Other hyperlipidemia: Secondary | ICD-10-CM | POA: Diagnosis not present

## 2019-06-09 DIAGNOSIS — I10 Essential (primary) hypertension: Secondary | ICD-10-CM | POA: Diagnosis not present

## 2019-06-09 DIAGNOSIS — Z87891 Personal history of nicotine dependence: Secondary | ICD-10-CM | POA: Diagnosis not present

## 2019-06-09 DIAGNOSIS — I2510 Atherosclerotic heart disease of native coronary artery without angina pectoris: Secondary | ICD-10-CM | POA: Diagnosis not present

## 2019-06-13 ENCOUNTER — Other Ambulatory Visit: Payer: Self-pay | Admitting: Neurology

## 2019-06-13 DIAGNOSIS — R5383 Other fatigue: Secondary | ICD-10-CM

## 2019-06-13 DIAGNOSIS — R0683 Snoring: Secondary | ICD-10-CM

## 2019-06-13 DIAGNOSIS — F329 Major depressive disorder, single episode, unspecified: Secondary | ICD-10-CM

## 2019-06-13 DIAGNOSIS — G4733 Obstructive sleep apnea (adult) (pediatric): Secondary | ICD-10-CM

## 2019-06-15 ENCOUNTER — Encounter: Payer: Self-pay | Admitting: Adult Health

## 2019-06-15 ENCOUNTER — Ambulatory Visit (INDEPENDENT_AMBULATORY_CARE_PROVIDER_SITE_OTHER): Payer: Medicare Other | Admitting: Adult Health

## 2019-06-15 ENCOUNTER — Other Ambulatory Visit: Payer: Self-pay

## 2019-06-15 VITALS — BP 170/70 | HR 66 | Temp 97.5°F | Ht 66.0 in | Wt 218.6 lb

## 2019-06-15 DIAGNOSIS — G4733 Obstructive sleep apnea (adult) (pediatric): Secondary | ICD-10-CM | POA: Diagnosis not present

## 2019-06-15 DIAGNOSIS — R0683 Snoring: Secondary | ICD-10-CM | POA: Diagnosis not present

## 2019-06-15 NOTE — Progress Notes (Signed)
PATIENT: [name] DOB: Jul 02, 1945  REASON FOR VISIT: follow up HISTORY FROM: patient  HISTORY OF PRESENT ILLNESS: Today 06/15/19 [name] is a 74 year old male with a history of obstructive sleep apnea.  He returns today for follow-up.  The patient never started therapy due to the cost of the machine.  The patient states that he now drifts when he has to go to sleep.  Reports that he has a hard time breathing at night therefore he does not sleep well.  He is now willing to pay the cost of the machine.  Reports that he continues to snore.  Reports that he is tired throughout the day.  Denies early morning headache.  Blood pressure is elevated today.  According to previous notes his blood pressure has been uncontrolled.  He does not take his blood pressure regularly at home.  Denies chest pain and dizziness.  He returns today for an evaluation.   HISTORY (Copied from Dr.Dohmeier's note)  [name] is a 74 y.o. male patient  , seen here on 01-10-2018 in a referral from Dr. Bronson Ing.   [name] is a 74 year old Caucasian gentleman who was recently evaluated for coronary artery disease.  He has almost every night trouble to breathe through his nose, reporting congestion of either both sides or one side of his nose at a time.  Even now as I speak here with him he clearly has to breathe through his mouth while we are holding interview.  He has some significant high blood pressure, currently poorly controlled.  He is not in pain, there is no headache no dizziness no palpitation.  The patient was seen for a coronary angiography on 04 November 2017 his proximal LAD has an eccentric stenosis and a 4 branch diagonal vessel which appeared narrowed.  75% narrowing was estimated.  The initial plan was to attempt aggressive medical therapy with statins, amlodipine and nitrates.  He has bradycardia and junctional rhythm and therefore was not placed based on calcium channel  blockers or beta blockers.  An echocardiogram on the same day demonstrated vigorous left ventricular filling his ejection fraction was 65 to 70% but he has a grade 2 diastolic dysfunction with an elevated filling pressure mild aortic regurgitation and left ventricular hypertrophy.  There is no report of atrial fibrillation.    Sleep habits are as follows: dinner time is 5.30 PM and bedtime is at 10 PM. He has been known to sleep quickly.  He does some exercise. He reads at night to help him sleep. The bedroom is cool, quiet an dark. He sleeps in all sleep positions, is restless. He uses 2 pillows. He dreams often and vivid  ([place]).   After three- four hour of sleep he wakes often up, not sure why- He wakes up at 3 AM for an bathroom break and usually a has a second one at 5 AM.  He rises at 9 AM- he is often awake for an hour before rising. He wakes without a dry mouth, no headaches.   Sleep medical history and family sleep history: HTN, uncontrolled, CAD, obesity, no DM, no thyroid disease.  He reports loss of desire and joy in the things he once enjoyed. He is frustration, depressed.    Social history:  Retired in [place] patient is married,  2 adult children, a former smoker does not use alcohol, caffeine use in form 2-3 coffee daily, very little soda or iced tea.  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  REVIEW OF SYSTEMS: Out of a complete 14 system review of symptoms, the patient complains only of the following symptoms, and all other reviewed systems are negative.  See HPI  ALLERGIES: Allergies  Allergen Reactions  . Penicillins     Eye swelled shut  .Has patient had a PCN reaction causing immediate rash, facial/tongue/throat swelling, SOB or lightheadedness with hypotension: Yes Has patient had a PCN reaction causing severe rash involving mucus membranes or skin necrosis: No Has patient had a PCN reaction that required  hospitalization: No Has patient had a PCN reaction occurring within the last 10 years: No If all of the above answers are "NO", then may proceed with Cephalosporin use    HOME MEDICATIONS: Outpatient Medications Prior to Visit  Medication Sig Dispense Refill  . amLODipine (NORVASC) 10 MG tablet Take 1 tablet by mouth once daily 90 tablet 3  . aspirin 81 MG tablet Take 81 mg by mouth daily.    . isosorbide mononitrate (IMDUR) 30 MG 24 hr tablet Take 1 tablet by mouth once daily 90 tablet 3  . losartan (COZAAR) 100 MG tablet Take 1 tablet (100 mg total) by mouth daily. 90 tablet 1  . rosuvastatin (CRESTOR) 5 MG tablet Take 1 tablet (5 mg total) by mouth daily. 90 tablet 3   No facility-administered medications prior to visit.    PAST MEDICAL HISTORY: Past Medical History:  Diagnosis Date  . Cancer ([place]) 07/2012   prostate  . Heart disease   . Hypertension     PAST SURGICAL HISTORY: Past Surgical History:  Procedure Laterality Date  . COLONOSCOPY N/A 08/28/2014   Procedure: COLONOSCOPY;  Surgeon: Aviva Signs, MD;  [facility];  Service: Gastroenterology;  Laterality: N/A;  . INTRAVASCULAR PRESSURE WIRE/FFR STUDY N/A 11/04/2017   Procedure: INTRAVASCULAR PRESSURE WIRE/DFR vs FFR  STUDY;  Surgeon: Troy Sine, MD;  Location: [facility] LAB;  Service: Cardiovascular;  Laterality: N/A;  . LEFT HEART CATH AND CORONARY ANGIOGRAPHY N/A 11/04/2017   Procedure: LEFT HEART CATH AND CORONARY ANGIOGRAPHY;  Surgeon: Troy Sine, MD;  Location: [facility];  Service: Cardiovascular;  Laterality: N/A;  . PROSTATE SURGERY  12/2012  . SHOULDER SURGERY Bilateral     FAMILY HISTORY: Family History  Problem Relation Age of Onset  . Stroke Mother   . Heart attack Father   . Stroke Father   . Kidney disease Brother     SOCIAL HISTORY: Social History   Socioeconomic History  . Marital status: Married    Spouse name: Not on file  . Number of children: Not on  file  . Years of education: Not on file  . Highest education level: Not on file  Occupational History  . Not on file  Tobacco Use  . Smoking status: Former Smoker    Packs/day: 0.25    Years: 18.00    Pack years: 4.50    Types: Cigarettes    Quit date: 1978    Years since quitting: 43.3  . Smokeless tobacco: Former Systems developer    Quit date: 02/09/1979  Substance and Sexual Activity  . Alcohol use: No  . Drug use: No  . Sexual activity: Not on file  Other Topics Concern  . Not on file  Social History Narrative   Lives   Caffeine use:    Social Determinants of Health   Financial Resource Strain:   .  Difficulty of Paying Living Expenses:   Food Insecurity:   . Worried About Charity fundraiser in the Last Year:   . Arboriculturist in the Last Year:   Transportation Needs:   . Film/video editor (Medical):   Marland Kitchen Lack of Transportation (Non-Medical):   Physical Activity:   . Days of Exercise per Week:   . Minutes of Exercise per Session:   Stress:   . Feeling of Stress :   Social Connections:   . Frequency of Communication with Friends and Family:   . Frequency of Social Gatherings with Friends and Family:   . Attends Religious Services:   . Active Member of Clubs or Organizations:   . Attends Archivist Meetings:   Marland Kitchen Marital Status:   Intimate Partner Violence:   . Fear of Current or Ex-Partner:   . Emotionally Abused:   Marland Kitchen Physically Abused:   . Sexually Abused:       PHYSICAL EXAM  Vitals:   06/15/19 1027 06/15/19 1106  BP: (!) 170/76 (!) 170/70  Pulse: 66   Temp: (!) 97.5 F (36.4 C)   Weight: 218 lb 9.6 oz (99.2 kg)   Height: 5\' 6"  (1.676 m)    Body mass index is 35.28 kg/m.  Continue using CPAP nightly and greater than 4 hours each night If your symptoms worsen or you develop new symptoms please let us know.     DIAGNOSTIC DATA (LABS, IMAGING, TESTING) - I reviewed patient records, labs, notes, testing and imaging myself where  available.  Lab Results  Component Value Date   WBC 9.2 11/05/2017   HGB 12.6 (L) 11/05/2017   HCT 35.7 (L) 11/05/2017   MCV 93.7 11/05/2017   PLT 222 11/05/2017      Component Value Date/Time   NA 138 11/05/2017 0351   K 3.6 11/05/2017 0351   CL 107 11/05/2017 0351   CO2 22 11/05/2017 0351   GLUCOSE 122 (H) 11/05/2017 0351   BUN 15 11/05/2017 0351   CREATININE 0.96 11/05/2017 0351   CALCIUM 8.8 (L) 11/05/2017 0351   PROT 6.7 11/04/2017 0405   ALBUMIN 3.8 11/04/2017 0405   AST 58 (H) 11/04/2017 0405   ALT 73 (H) 11/04/2017 0405   ALKPHOS 40 11/04/2017 0405   BILITOT 0.8 11/04/2017 0405   GFRNONAA >60 11/05/2017 0351   GFRAA >60 11/05/2017 0351   Lab Results  Component Value Date   CHOL 119 02/14/2018   HDL 51 02/14/2018   LDLCALC 50 02/14/2018   TRIG 88 02/14/2018   CHOLHDL 2.3 02/14/2018   Lab Results  Component Value Date   HGBA1C 5.8 (H) 11/03/2017   No results found for: VITAMINB12 Lab Results  Component Value Date   TSH 2.867 11/05/2017      ASSESSMENT AND PLAN 74 y.o. year old male  has a past medical history of Cancer ([place]) (07/2012), Heart disease, and Hypertension. here with:  1.  Obstructive sleep apnea on CPAP  -Patient is willing to restart CPAP therapy -Discussed with sleep lab a home sleep test needs to be reordered.  2.  Hypertension  -Blood pressure elevated on 2 different readings -Advised patient to take his blood pressure at home-if it remains elevated he should  make his cardiologist aware -I did send a message to his cardiologist making him aware of his blood pressure today.   Advised patient if his symptoms worsen or he develops new symptoms he should let us know.  Follow-up after  home sleep test   I spent 20 minutes of face-to-face and non-face-to-face time with patient.  This included previsit chart review, lab review, study review, order entry, electronic health record documentation, patient education.  Ward Givens, MSN,  NP-C 06/15/2019, 12:57 PM [facility] [address] [place], [place] [address] [phone]

## 2019-06-15 NOTE — Patient Instructions (Signed)
Sleep study ordered

## 2019-06-15 NOTE — Addendum Note (Signed)
Addended by: Trudie Buckler on: 06/15/2019 01:13 PM   Modules accepted: Orders

## 2019-06-19 ENCOUNTER — Other Ambulatory Visit: Payer: Self-pay | Admitting: Neurology

## 2019-06-19 DIAGNOSIS — G4733 Obstructive sleep apnea (adult) (pediatric): Secondary | ICD-10-CM

## 2019-06-19 DIAGNOSIS — R0683 Snoring: Secondary | ICD-10-CM

## 2019-06-19 DIAGNOSIS — R5383 Other fatigue: Secondary | ICD-10-CM

## 2019-06-21 ENCOUNTER — Telehealth: Payer: Self-pay

## 2019-06-21 NOTE — Telephone Encounter (Signed)
LVM for pt to call me back to schedule sleep study  

## 2019-06-28 ENCOUNTER — Ambulatory Visit (INDEPENDENT_AMBULATORY_CARE_PROVIDER_SITE_OTHER): Payer: Medicare Other | Admitting: Neurology

## 2019-06-28 DIAGNOSIS — G4733 Obstructive sleep apnea (adult) (pediatric): Secondary | ICD-10-CM | POA: Diagnosis not present

## 2019-06-28 DIAGNOSIS — R063 Periodic breathing: Secondary | ICD-10-CM

## 2019-06-28 DIAGNOSIS — R0683 Snoring: Secondary | ICD-10-CM

## 2019-06-28 DIAGNOSIS — F32A Depression, unspecified: Secondary | ICD-10-CM

## 2019-06-28 DIAGNOSIS — G4734 Idiopathic sleep related nonobstructive alveolar hypoventilation: Secondary | ICD-10-CM

## 2019-07-01 ENCOUNTER — Encounter: Payer: Self-pay | Admitting: Neurology

## 2019-07-05 IMAGING — DX DG CHEST 2V
2 series · 2 of 2 positions shown · non-contrast
Comparison: 11/30/2008

CLINICAL DATA: Intermittent chest pressure

EXAM:
CHEST - 2 VIEW

[chest pa]
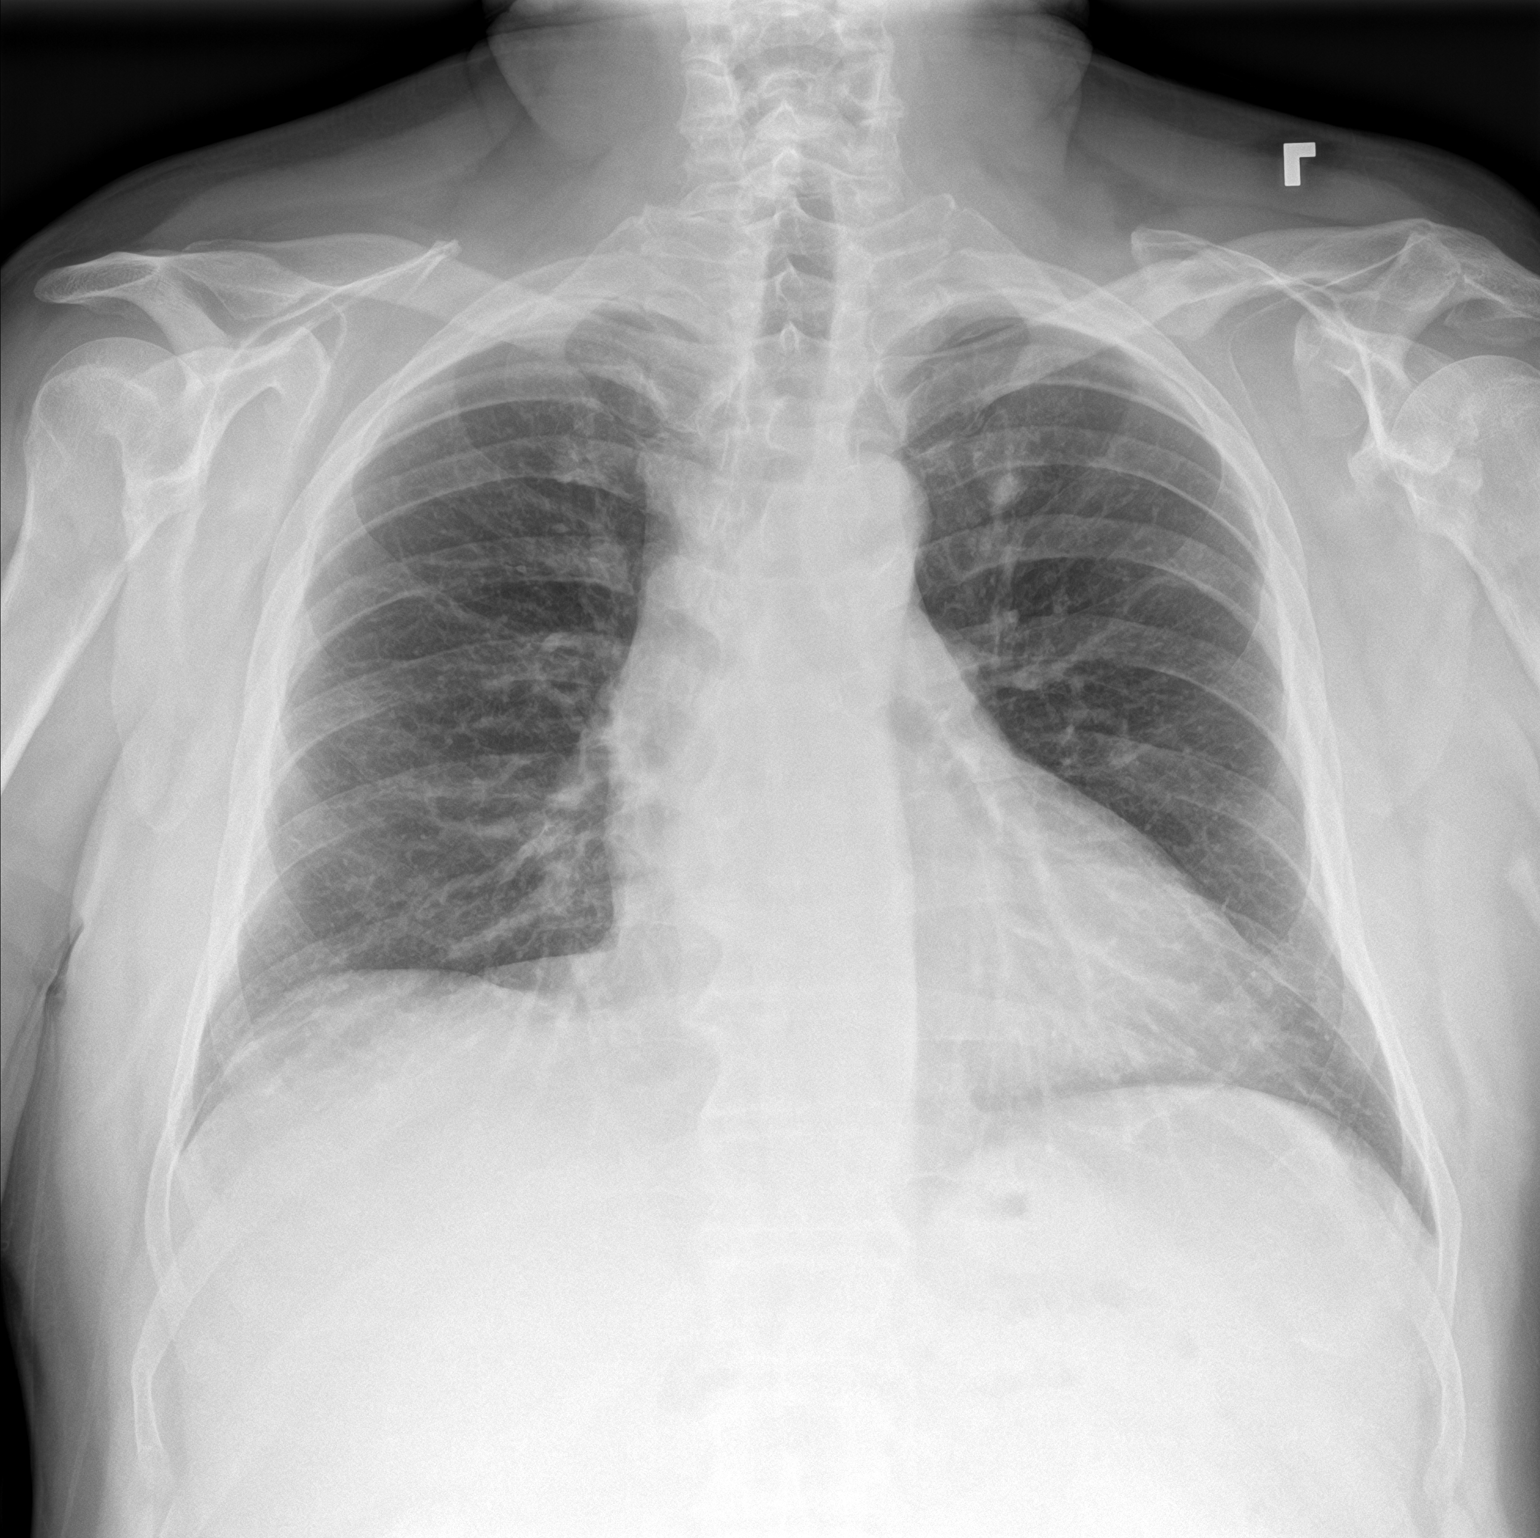

[chest lat]
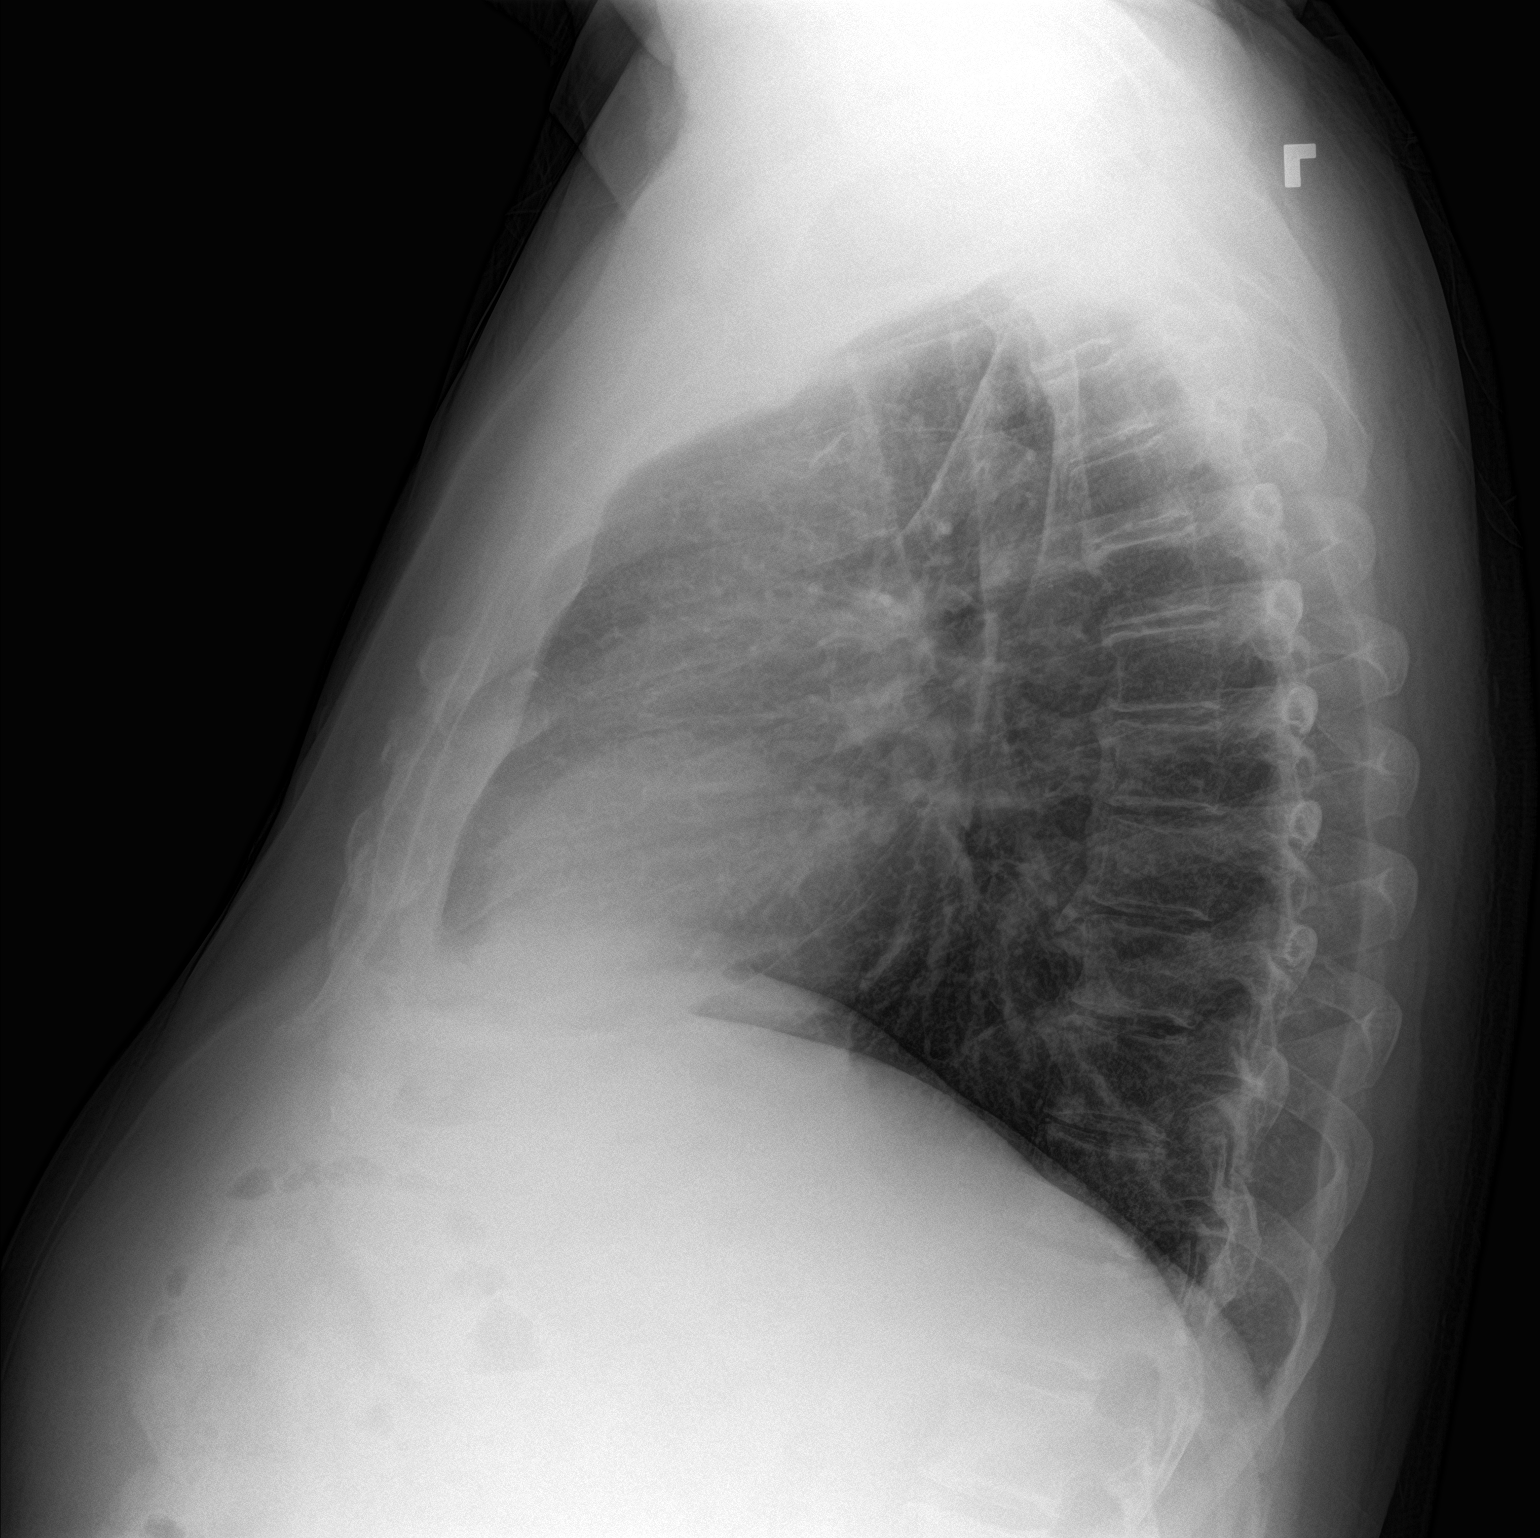

[2 of 2 positions shown; findings below may reference images not displayed]

FINDINGS: The heart size and mediastinal contours are within normal limits.
Both lungs are clear. The visualized skeletal structures are
unremarkable.
IMPRESSION: No acute abnormality noted.

## 2019-07-10 DIAGNOSIS — I2510 Atherosclerotic heart disease of native coronary artery without angina pectoris: Secondary | ICD-10-CM | POA: Diagnosis not present

## 2019-07-10 DIAGNOSIS — E7849 Other hyperlipidemia: Secondary | ICD-10-CM | POA: Diagnosis not present

## 2019-07-10 DIAGNOSIS — I10 Essential (primary) hypertension: Secondary | ICD-10-CM | POA: Diagnosis not present

## 2019-07-10 DIAGNOSIS — Z87891 Personal history of nicotine dependence: Secondary | ICD-10-CM | POA: Diagnosis not present

## 2019-07-11 NOTE — Progress Notes (Signed)
Severe Sleep Apnea at AHI of 37.2/h and REM dependent type as demonstrated by REM AHI of 57.4/h.  oxygen desaturation and heart rate were noted to be REM sleep accentuated as well. The HST pulse rate also stated the mean heart rate to be only 51 bpm, which is low.    Recommendations:    REM dependent sleep apnea was present to such a strong degree that positive airway pressure is the only efficient treatment option.  Autotitration capable CPAP deice with a setting from 6-18 cm water 2 cm EPR, heated humidity and mask of patient's choice and comfort.  Interpreting Physician: Larey Seat, MD   [place], Dr Hilma Favors.

## 2019-07-11 NOTE — Procedures (Signed)
  Patient Information     First Name: James Last Name: Copeland ID: [id number]  Birth Date: [birth year] Age: 74 Gender: Male  Referring Provider: Sharilyn Sites, MD BMI: 35.1 (W=218 lbs, H=5' 6'')  Sleep Study Information    Study Date: Jul 01, 2019 S/H/A Version: 001.001.001.001 / 4.1.1528 / 87  History:    [name] is a 74 year old male with a diagnosis of (untreated) obstructive sleep apnea.  The patient never started therapy due to the cost of the machine.  Reports that he has a hard time breathing at night therefore he does not sleep well.  Reports that he continues to snore.  Reports that he is tired throughout the day.  Denies early morning headache.  Blood pressure is elevated today.  According to previous notes his blood pressure has been uncontrolled.  Denies chest pain and dizziness.         Summary & Diagnosis:    Severe Sleep Apnea at AHI of 37.2/h and REM dependent type as demonstrated by REM AHI of 57.4/h.  oxygen desaturation and heart rate were noted to be REM sleep accentuated as well. The HST pulse rate also stated the mean heart rate to be only 51 bpm, which is low.    Recommendations:     REM dependent sleep apnea was present to such a strong degree that positive airway pressure is the only efficient treatment option.  Autotitration capable CPAP deice with a setting from 6-18 cm water 2 cm EPR, heated humidity and ask of patient's choice and comfort.  Interpreting Physician: Larey Seat, MD            Sleep Summary  Oxygen Saturation Statistics   Start Study Time: End Study Time: Total Recording Time:          10:16:19 PM 7:09:56 AM   8 h, 53 min  Total Sleep Time % REM of Sleep Time:  6 h, 52 min  22.2    Mean: 92 Minimum: 83 Maximum: 98  Mean of Desaturations Nadirs (%):   89  Oxygen Desaturation. %:   4-9 10-20 >20 Total  Events Number Total   141  6 95.9 4.1  0 0.0  147 100.0  Oxygen Saturation: <90 <=88 <85 <80 <70  Duration  (minutes): Sleep % 28.4 6.9 15.4 0.7 3.7 0.2 0.0 0.0 0.0 0.0     Respiratory Indices      Total Events REM NREM All Night  pRDI:  264  pAHI:  253 ODI:  147  pAHIc:  12  % CSR: 0.0 59.4 57.4 42.2 0.0 33.0 31.5 15.7 2.3 38.9 37.2 21.6 1.8       Pulse Rate Statistics during Sleep (BPM)      Mean: 51 Minimum: N/A Maximum:  86    Indices are calculated using technically valid sleep time of 6 h, 47 min. pRDI/pAHI are calculated using 02 desaturations ? 3%  Body Position Statistics  Position Supine Prone Right Left Non-Supine  Sleep (min) 101.0 0.0 178.1 133.5 311.6  Sleep % 24.5 0.0 43.2 32.4 75.5  pRDI 57.1 N/A 37.7 26.8 33.0  pAHI 52.8 N/A 36.6 26.4 32.3  ODI 27.3 N/A 24.8 13.2 19.8     Snoring Statistics Snoring Level (dB) >40 >50 >60 >70 >80 >Threshold (45)  Sleep (min) 302.8 136.2 10.9 0.1 0.0 203.9  Sleep % 73.4 33.0 2.6 0.0 0.0 49.4    Mean: 47 dB

## 2019-07-11 NOTE — Addendum Note (Signed)
Addended by: Larey Seat on: 07/11/2019 04:42 PM   Modules accepted: Orders

## 2019-07-12 ENCOUNTER — Telehealth: Payer: Self-pay | Admitting: *Deleted

## 2019-07-12 ENCOUNTER — Encounter: Payer: Self-pay | Admitting: *Deleted

## 2019-07-12 NOTE — Telephone Encounter (Signed)
Called and spoke with pt about results. Pt verbalized understanding. Also sent pt letter.  Set up f/u for 10/03/19 at 1:30pm. Sent community message to [facility] that new orders were placed.

## 2019-07-12 NOTE — Telephone Encounter (Signed)
-----   Message from Wyvonnia Lora, RN sent at 07/12/2019  8:29 AM EDT -----  ----- Message ----- From: Larey Seat, MD Sent: 07/11/2019   4:42 PM EDT To: Darleen Crocker, RN  Severe Sleep Apnea at AHI of 37.2/h and REM dependent type as demonstrated by REM AHI of 57.4/h.  oxygen desaturation and heart rate were noted to be REM sleep accentuated as well. The HST pulse rate also stated the mean heart rate to be only 51 bpm, which is low.    Recommendations:    REM dependent sleep apnea was present to such a strong degree that positive airway pressure is the only efficient treatment option.  Autotitration capable CPAP deice with a setting from 6-18 cm water 2 cm EPR, heated humidity and mask of patient's choice and comfort.  Interpreting Physician: Larey Seat, MD   [place], Dr Hilma Favors.

## 2019-07-31 DIAGNOSIS — G4733 Obstructive sleep apnea (adult) (pediatric): Secondary | ICD-10-CM | POA: Diagnosis not present

## 2019-08-09 DIAGNOSIS — E7849 Other hyperlipidemia: Secondary | ICD-10-CM | POA: Diagnosis not present

## 2019-08-09 DIAGNOSIS — I10 Essential (primary) hypertension: Secondary | ICD-10-CM | POA: Diagnosis not present

## 2019-08-09 DIAGNOSIS — I2510 Atherosclerotic heart disease of native coronary artery without angina pectoris: Secondary | ICD-10-CM | POA: Diagnosis not present

## 2019-08-09 DIAGNOSIS — Z87891 Personal history of nicotine dependence: Secondary | ICD-10-CM | POA: Diagnosis not present

## 2019-08-30 DIAGNOSIS — G4733 Obstructive sleep apnea (adult) (pediatric): Secondary | ICD-10-CM | POA: Diagnosis not present

## 2019-09-08 ENCOUNTER — Other Ambulatory Visit: Payer: Self-pay

## 2019-09-08 ENCOUNTER — Ambulatory Visit: Payer: Medicare Other | Admitting: Student

## 2019-09-08 ENCOUNTER — Encounter: Payer: Self-pay | Admitting: *Deleted

## 2019-09-08 ENCOUNTER — Encounter: Payer: Self-pay | Admitting: Student

## 2019-09-08 VITALS — BP 152/62 | HR 66 | Ht 66.0 in | Wt 220.6 lb

## 2019-09-08 DIAGNOSIS — G4730 Sleep apnea, unspecified: Secondary | ICD-10-CM | POA: Diagnosis not present

## 2019-09-08 DIAGNOSIS — I10 Essential (primary) hypertension: Secondary | ICD-10-CM | POA: Diagnosis not present

## 2019-09-08 DIAGNOSIS — I25118 Atherosclerotic heart disease of native coronary artery with other forms of angina pectoris: Secondary | ICD-10-CM

## 2019-09-08 DIAGNOSIS — I2510 Atherosclerotic heart disease of native coronary artery without angina pectoris: Secondary | ICD-10-CM | POA: Diagnosis not present

## 2019-09-08 DIAGNOSIS — R079 Chest pain, unspecified: Secondary | ICD-10-CM

## 2019-09-08 DIAGNOSIS — E785 Hyperlipidemia, unspecified: Secondary | ICD-10-CM | POA: Diagnosis not present

## 2019-09-08 DIAGNOSIS — E7849 Other hyperlipidemia: Secondary | ICD-10-CM | POA: Diagnosis not present

## 2019-09-08 DIAGNOSIS — Z87891 Personal history of nicotine dependence: Secondary | ICD-10-CM | POA: Diagnosis not present

## 2019-09-08 MED ORDER — ISOSORBIDE MONONITRATE ER 60 MG PO TB24: 60.0000 mg | ORAL_TABLET | Freq: Every day | ORAL | 3 refills | Status: DC

## 2019-09-08 NOTE — Patient Instructions (Addendum)
Medication Instructions:  Your physician has recommended you make the following change in your medication:   Increase Imdur to 60 mg Daily    *If you need a refill on your cardiac medications before your next appointment, please call your pharmacy*   Lab Work: NONE   If you have labs (blood work) drawn today and your tests are completely normal, you will receive your results only by: [name] MyChart Message (if you have MyChart) OR . A paper copy in the mail If you have any lab test that is abnormal or we need to change your treatment, we will call you to review the results.   Testing/Procedures: Your physician has requested that you have en exercise stress myoview. For further information please visit [email or web address]. Please follow instruction sheet, as given.     Follow-Up: At [facility], you and your health needs are our priority.  As part of our continuing mission to provide you with exceptional heart care, we have created designated Provider Care Teams.  These Care Teams include your primary Cardiologist (physician) and Advanced Practice Providers (APPs -  Physician Assistants and Nurse Practitioners) who all work together to provide you with the care you need, when you need it.  We recommend signing up for the patient portal called "MyChart".  Sign up information is provided on this After Visit Summary.  MyChart is used to connect with patients for Virtual Visits (Telemedicine).  Patients are able to view lab/test results, encounter notes, upcoming appointments, etc.  Non-urgent messages can be sent to your provider as well.   To learn more about what you can do with MyChart, go to [email or web address].    Your next appointment:   3 month(s)  The format for your next appointment:   In Person  Provider:   Carlyle Dolly, MD or Bernerd Pho, PA-C   Other Instructions Thank you for choosing [facility]!    Two Gram Sodium Diet 2000 mg  What is  Sodium? Sodium is a mineral found naturally in many foods. The most significant source of sodium in the diet is table salt, which is about 40% sodium.  Processed, convenience, and preserved foods also contain a large amount of sodium.  The body needs only 500 mg of sodium daily to function,  A normal diet provides more than enough sodium even if you do not use salt.  Why Limit Sodium? A build up of sodium in the body can cause thirst, increased blood pressure, shortness of breath, and water retention.  Decreasing sodium in the diet can reduce edema and risk of heart attack or stroke associated with high blood pressure.  Keep in mind that there are many other factors involved in these health problems.  Heredity, obesity, lack of exercise, cigarette smoking, stress and what you eat all play a role.  General Guidelines:  Do not add salt at the table or in cooking.  One teaspoon of salt contains over 2 grams of sodium.  Read food labels  Avoid processed and convenience foods  Ask your dietitian before eating any foods not dicussed in the menu planning guidelines  Consult your physician if you wish to use a salt substitute or a sodium containing medication such as antacids.  Limit milk and milk products to 16 oz (2 cups) per day.  Shopping Hints:  READ LABELS!! "Dietetic" does not necessarily mean low sodium.  Salt and other sodium ingredients are often added to foods during processing.   Menu Planning Guidelines Food Group  Choose More Often Avoid  Beverages (see also the milk group All fruit juices, low-sodium, salt-free vegetables juices, low-sodium carbonated beverages Regular vegetable or tomato juices, commercially softened water used for drinking or cooking  Breads and Cereals Enriched white, wheat, rye and pumpernickel bread, hard rolls and dinner rolls; muffins, cornbread and waffles; most dry cereals, cooked cereal without added salt; unsalted crackers and breadsticks; low sodium or  homemade bread crumbs Bread, rolls and crackers with salted tops; quick breads; instant hot cereals; pancakes; commercial bread stuffing; self-rising flower and biscuit mixes; regular bread crumbs or cracker crumbs  Desserts and Sweets Desserts and sweets mad with mild should be within allowance Instant pudding mixes and cake mixes  Fats Butter or margarine; vegetable oils; unsalted salad dressings, regular salad dressings limited to 1 Tbs; light, sour and heavy cream Regular salad dressings containing bacon fat, bacon bits, and salt pork; snack dips made with instant soup mixes or processed cheese; salted nuts  Fruits Most fresh, frozen and canned fruits Fruits processed with salt or sodium-containing ingredient (some dried fruits are processed with sodium sulfites        Vegetables Fresh, frozen vegetables and low- sodium canned vegetables Regular canned vegetables, sauerkraut, pickled vegetables, and others prepared in brine; frozen vegetables in sauces; vegetables seasoned with ham, bacon or salt pork  Condiments, Sauces, Miscellaneous  Salt substitute with physician's approval; pepper, herbs, spices; vinegar, lemon or lime juice; hot pepper sauce; garlic powder, onion powder, low sodium soy sauce (1 Tbs.); low sodium condiments (ketchup, chili sauce, mustard) in limited amounts (1 tsp.) fresh ground horseradish; unsalted tortilla chips, pretzels, potato chips, popcorn, salsa (1/4 cup) Any seasoning made with salt including garlic salt, celery salt, onion salt, and seasoned salt; sea salt, rock salt, kosher salt; meat tenderizers; monosodium glutamate; mustard, regular soy sauce, barbecue, sauce, chili sauce, teriyaki sauce, steak sauce, Worcestershire sauce, and most flavored vinegars; canned gravy and mixes; regular condiments; salted snack foods, olives, picles, relish, horseradish sauce, catsup   Food preparation: Try these seasonings Meats:    Pork Sage, onion Serve with applesauce  Chicken  Poultry seasoning, thyme, parsley Serve with cranberry sauce  Lamb Curry powder, rosemary, garlic, thyme Serve with mint sauce or jelly  Veal Marjoram, basil Serve with current jelly, cranberry sauce  Beef Pepper, bay leaf Serve with dry mustard, unsalted chive butter  Fish [place] leaf, dill Serve with unsalted lemon butter, unsalted parsley butter  Vegetables:    Asparagus Lemon juice   Broccoli Lemon juice   Carrots Mustard dressing parsley, mint, nutmeg, glazed with unsalted butter and sugar   Green beans Marjoram, lemon juice, nutmeg,dill seed   Tomatoes Basil, marjoram, onion   Spice /blend for [employer]" 4 tsp ground thyme 1 tsp ground sage 3 tsp ground rosemary 4 tsp ground marjoram   Test your knowledge 1. A product that says "Salt Free" may still contain sodium. True or False 2. Garlic Powder and Hot Pepper Sauce an be used as alternative seasonings.True or False 3. Processed foods have more sodium than fresh foods.  True or False 4. Canned Vegetables have less sodium than froze True or False  WAYS TO DECREASE YOUR SODIUM INTAKE 1. Avoid the use of added salt in cooking and at the table.  Table salt (and other prepared seasonings which contain salt) is probably one of the greatest sources of sodium in the diet.  Unsalted foods can gain flavor from the sweet, sour, and butter taste sensations of herbs and spices.  Instead of using salt for seasoning, try the following seasonings with the foods listed.  Remember: how you use them to enhance natural food flavors is limited only by your creativity... Allspice-Meat, fish, eggs, fruit, peas, red and yellow vegetables Almond Extract-Fruit baked goods Anise Seed-Sweet breads, fruit, carrots, beets, cottage cheese, cookies (tastes like licorice) Basil-Meat, fish, eggs, vegetables, rice, vegetables salads, soups, sauces Bay Leaf-Meat, fish, stews, poultry Burnet-Salad, vegetables (cucumber-like flavor) Caraway Seed-Bread, cookies, cottage  cheese, meat, vegetables, cheese, rice Cardamon-Baked goods, fruit, soups Celery Powder or seed-Salads, salad dressings, sauces, meatloaf, soup, bread.Do not use  celery salt Chervil-Meats, salads, fish, eggs, vegetables, cottage cheese (parsley-like flavor) Chili Power-Meatloaf, chicken cheese, corn, eggplant, egg dishes Chives-Salads cottage cheese, egg dishes, soups, vegetables, sauces Cilantro-Salsa, casseroles Cinnamon-Baked goods, fruit, pork, lamb, chicken, carrots Cloves-Fruit, baked goods, fish, pot roast, green beans, beets, carrots Coriander-Pastry, cookies, meat, salads, cheese (lemon-orange flavor) Cumin-Meatloaf, fish,cheese, eggs, cabbage,fruit pie (caraway flavor) [employer], fruit, eggs, fish, poultry, cottage cheese, vegetables Dill Seed-Meat, cottage cheese, poultry, vegetables, fish, salads, bread Fennel Seed-Bread, cookies, apples, pork, eggs, fish, beets, cabbage, cheese, Licorice-like flavor Garlic-(buds or powder) Salads, meat, poultry, fish, bread, butter, vegetables, potatoes.Do not  use garlic salt Ginger-Fruit, vegetables, baked goods, meat, fish, poultry Horseradish Root-Meet, vegetables, butter Lemon Juice or Extract-Vegetables, fruit, tea, baked goods, fish salads Mace-Baked goods fruit, vegetables, fish, poultry (taste like nutmeg) Maple Extract-Syrups Marjoram-Meat, chicken, fish, vegetables, breads, green salads (taste like Sage) Mint-Tea, lamb, sherbet, vegetables, desserts, carrots, cabbage Mustard, Dry or Seed-Cheese, eggs, meats, vegetables, poultry Nutmeg-Baked goods, fruit, chicken, eggs, vegetables, desserts Onion Powder-Meat, fish, poultry, vegetables, cheese, eggs, bread, rice salads (Do not use   Onion salt) Orange Extract-Desserts, baked goods Oregano-Pasta, eggs, cheese, onions, pork, lamb, fish, chicken, vegetables, green salads Paprika-Meat, fish, poultry, eggs, cheese, vegetables Parsley Flakes-Butter, vegetables, meat fish,  poultry, eggs, bread, salads (certain forms may   Contain sodium Pepper-Meat fish, poultry, vegetables, eggs Peppermint Extract-Desserts, baked goods Poppy Seed-Eggs, bread, cheese, fruit dressings, baked goods, noodles, vegetables, cottage  [employer], poultry, meat, fish, cauliflower, turnips,eggs bread Saffron-Rice, bread, veal, chicken, fish, eggs Sage-Meat, fish, poultry, onions, eggplant, tomateos, pork, stews Savory-Eggs, salads, poultry, meat, rice, vegetables, soups, pork Tarragon-Meat, poultry, fish, eggs, butter, vegetables (licorice-like flavor)  Thyme-Meat, poultry, fish, eggs, vegetables, (clover-like flavor), sauces, soups Tumeric-Salads, butter, eggs, fish, rice, vegetables (saffron-like flavor) Vanilla Extract-Baked goods, candy Vinegar-Salads, vegetables, meat marinades Walnut Extract-baked goods, candy  2. Choose your Foods Wisely   The following is a list of foods to avoid which are high in sodium:  Meats-Avoid all smoked, canned, salt cured, dried and kosher meat and fish as well as Anchovies   [employer] meats:Bologna, Liverwurst, Pastrami Canned meat or fish  Marinated herring Caviar    Pepperoni Corned Beef   Pizza Dried chipped beef  Salami Frozen breaded fish or meat Salt pork Frankfurters or hot dogs  Sardines Gefilte fish   Sausage Ham (boiled ham, Proscuitto Smoked butt    spiced ham)   Spam      TV Dinners Vegetables Canned vegetables (Regular) Relish Canned mushrooms  Sauerkraut Olives    Tomato juice Pickles  Bakery and Dessert Products Canned puddings  Cream pies Cheesecake   Decorated cakes Cookies  Beverages/Juices Tomato juice, regular  Gatorade   V-8 vegetable juice, regular  Breads and Cereals Biscuit mixes   Salted potato chips, corn chips, pretzels Bread stuffing mixes  Salted crackers and rolls Pancake and waffle mixes Self-rising  flour  Seasonings Accent    Meat  sauces Barbecue sauce  Meat tenderizer Catsup    Monosodium glutamate (MSG) Celery salt   Onion salt Chili sauce   Prepared mustard Garlic salt   Salt, seasoned salt, sea salt Gravy mixes   Soy sauce Horseradish   Steak sauce Ketchup   Tartar sauce Lite salt    Teriyaki sauce Marinade mixes   Worcestershire sauce  Others Baking powder   Cocoa and cocoa mixes Baking soda   Commercial casserole mixes Candy-caramels, chocolate  Dehydrated soups    Bars, fudge,nougats  Instant rice and pasta mixes Canned broth or soup  Maraschino cherries Cheese, aged and processed cheese and cheese spreads

## 2019-09-08 NOTE — H&P (View-Only) (Signed)
Cardiology Office Note    Date:  09/09/2019   ID:  [name], DOB 01/28/1946, MRN [id number]  PCP:  Sharilyn Sites, MD  Cardiologist: Kate Sable, MD (Inactive)    Chief Complaint  Patient presents with  . Follow-up    6 month visit    History of Present Illness:    [name] is a 74 y.o. male with past medical history of CAD (s/p cath in 10/2017 which showed eccentric stenosis along the proximal LAD which appeared to have 30% stenosis except RAO cranial view and appeared to have 75% stenosis with discordant mildly positive DFR and negative FFR and medical management initially recommended given large diagonal branch), HTN and HLD who presents to the office today for 26-month follow-up.  He most recently had a phone visit with Dr. Bronson Ing in 02/2019 and denied any recent chest pain, dyspnea or palpitations at the time of his visit.  He did report not feeling well after taking Atorvastatin and this was discontinued with him being switched to Crestor 5 mg daily.  He was continued on Amlodipine 10 mg daily, ASA 81 mg daily, Imdur 30 mg daily and Losartan 100 mg daily.  In talking with the patient today, he reports having episodes of dyspnea with exertion for the past few years but feels like this has progressed in the past 6 months. Most noticeable when walking to his mailbox and this has an incline. Says he has gained weight and feels like this has contributed to his symptoms as well. He does describe some associated chest tightness and symptoms resolve quickly without the use of SL NTG. He does describe some chest discomfort initially when walking into [employer] but then symptoms resolve when walking out so unsure of what this is due to.   He denies any specific orthopnea, PND or edema. No recent palpitations. He does have difficulty with his CPAP at night is being followed by Dr. Brett Fairy as he feels like it is suffocating him at times.   Past Medical History:    Diagnosis Date  . CAD (coronary artery disease)    a. s/p cath in 10/2017 which showed eccentric stenosis along the proximal LAD which appeared to have 30% stenosis except RAO cranial view and appeared to have 75% stenosis with discordant mildly positive DFR and negative FFR and medical management initially recommended given large diagonal branch  . Cancer ([place]) 07/2012   prostate  . Heart disease   . Hypertension     Past Surgical History:  Procedure Laterality Date  . COLONOSCOPY N/A 08/28/2014   Procedure: COLONOSCOPY;  Surgeon: Aviva Signs, MD;  Location: [facility];  Service: Gastroenterology;  Laterality: N/A;  . INTRAVASCULAR PRESSURE WIRE/FFR STUDY N/A 11/04/2017   Procedure: INTRAVASCULAR PRESSURE WIRE/DFR vs FFR  STUDY;  Surgeon: Troy Sine, MD;  Location: [facility];  Service: Cardiovascular;  Laterality: N/A;  . LEFT HEART CATH AND CORONARY ANGIOGRAPHY N/A 11/04/2017   Procedure: LEFT HEART CATH AND CORONARY ANGIOGRAPHY;  Surgeon: Troy Sine, MD;  Location: [facility];  Service: Cardiovascular;  Laterality: N/A;  . PROSTATE SURGERY  12/2012  . SHOULDER SURGERY Bilateral     Current Medications: Outpatient Medications Prior to Visit  Medication Sig Dispense Refill  . amLODipine (NORVASC) 10 MG tablet Take 1 tablet by mouth once daily 90 tablet 3  . aspirin 81 MG tablet Take 81 mg by mouth daily.    Marland Kitchen losartan (COZAAR) 100 MG tablet  Take 1 tablet (100 mg total) by mouth daily. 90 tablet 1  . rosuvastatin (CRESTOR) 5 MG tablet Take 1 tablet (5 mg total) by mouth daily. 90 tablet 3  . isosorbide mononitrate (IMDUR) 30 MG 24 hr tablet Take 1 tablet by mouth once daily 90 tablet 3   No facility-administered medications prior to visit.     Allergies:   Penicillins   Social History   Socioeconomic History  . Marital status: Married    Spouse name: Not on file  . Number of children: Not on file  . Years of education: Not on file  . Highest  education level: Not on file  Occupational History  . Not on file  Tobacco Use  . Smoking status: Former Smoker    Packs/day: 0.25    Years: 18.00    Pack years: 4.50    Types: Cigarettes    Quit date: 1978    Years since quitting: 43.6  . Smokeless tobacco: Former Systems developer    Quit date: 02/09/1979  Vaping Use  . Vaping Use: Never used  Substance and Sexual Activity  . Alcohol use: No  . Drug use: No  . Sexual activity: Not on file  Other Topics Concern  . Not on file  Social History Narrative   Lives   Caffeine use:    Social Determinants of Health   Financial Resource Strain:   . Difficulty of Paying Living Expenses:   Food Insecurity:   . Worried About Charity fundraiser in the Last Year:   . Arboriculturist in the Last Year:   Transportation Needs:   . Film/video editor (Medical):   Marland Kitchen Lack of Transportation (Non-Medical):   Physical Activity:   . Days of Exercise per Week:   . Minutes of Exercise per Session:   Stress:   . Feeling of Stress :   Social Connections:   . Frequency of Communication with Friends and Family:   . Frequency of Social Gatherings with Friends and Family:   . Attends Religious Services:   . Active Member of Clubs or Organizations:   . Attends Archivist Meetings:   Marland Kitchen Marital Status:      Family History:  The patient's family history includes Heart attack in his father; Kidney disease in his brother; Stroke in his father and mother.   Review of Systems:   Please see the history of present illness.     General:  No chills, fever, night sweats or weight changes.  Cardiovascular:  No edema, orthopnea, palpitations, paroxysmal nocturnal dyspnea. Positive for chest tightness and dyspnea on exertion.  Dermatological: No rash, lesions/masses Respiratory: No cough, dyspnea Urologic: No hematuria, dysuria Abdominal:   No nausea, vomiting, diarrhea, bright red blood per rectum, melena, or hematemesis Neurologic:  No visual  changes, wkns, changes in mental status. All other systems reviewed and are otherwise negative except as noted above.   Physical Exam:    VS:  BP (!) 152/62   Pulse 66   Ht 5\' 6"  (1.676 m)   Wt (!) 220 lb 9.6 oz (100.1 kg)   SpO2 97%   BMI 35.61 kg/m    General: Well developed, well nourished,male appearing in no acute distress. Head: Normocephalic, atraumatic, sclera non-icteric.  Neck: No carotid bruits. JVD not elevated.  Lungs: Respirations regular and unlabored, without wheezes or rales.  Heart: Regular rate and rhythm. No S3 or S4.  No murmur, no rubs, or gallops appreciated. Abdomen: Soft,  non-tender, non-distended. No obvious abdominal masses. Msk:  Strength and tone appear normal for age. No obvious joint deformities or effusions. Extremities: No clubbing or cyanosis. No lower extremity edema.  Distal pedal pulses are 2+ bilaterally. Neuro: Alert and oriented X 3. Moves all extremities spontaneously. No focal deficits noted. Psych:  Responds to questions appropriately with a normal affect. Skin: No rashes or lesions noted  Wt Readings from Last 3 Encounters:  09/08/19 (!) 220 lb 9.6 oz (100.1 kg)  06/15/19 218 lb 9.6 oz (99.2 kg)  02/23/19 200 lb (90.7 kg)     Studies/Labs Reviewed:   EKG:  EKG is ordered today.  The ekg ordered today demonstrates NSR, HR 66 with incomplete RBBB and LVH with associated repol abnormality and TWI along anterolateral leads which is similar to prior tracings.   Recent Labs: No results found for requested labs within last 8760 hours.   Lipid Panel    Component Value Date/Time   CHOL 119 02/14/2018 1242   TRIG 88 02/14/2018 1242   HDL 51 02/14/2018 1242   CHOLHDL 2.3 02/14/2018 1242   VLDL 18 02/14/2018 1242   LDLCALC 50 02/14/2018 1242    Additional studies/ records that were reviewed today include:   Echocardiogram: 10/2017 Study Conclusions   - Left ventricle: The cavity size was normal. Systolic function was  vigorous.  The estimated ejection fraction was in the range of 65%  to 70%. Wall motion was normal; there were no regional wall  motion abnormalities. Features are consistent with a pseudonormal  left ventricular filling pattern, with concomitant abnormal  relaxation and increased filling pressure (grade 2 diastolic  dysfunction). Doppler parameters are consistent with high  ventricular filling pressure. Mild to moderate posterior wall and  severe proximal septal hypertrophy.  - Aortic valve: Mildly calcified annulus. Trileaflet. There was  mild regurgitation.  - Mitral valve: Mildly calcified annulus.  - Left atrium: The atrium was mildly dilated.  - Atrial septum: No defect or patent foramen ovale was identified.    Cardiac Catheterization: 10/2017  Ost RCA to Prox RCA lesion is 50% stenosed.  Prox RCA lesion is 20% stenosed.  Post Atrio lesion is 20% stenosed.  Prox LAD lesion is 75% stenosed.  Prox Cx to Mid Cx lesion is 10% stenosed.  1st Mrg lesion is 20% stenosed.  The left ventricular systolic function is normal.  LV end diastolic pressure is normal.  The left ventricular ejection fraction is 55-65% by visual estimate.   The proximal LAD has an eccentric stenosis in the region of a large 4 branch diagonal vessel which in most views does not appear significantly narrowed but in the RAO cranial view appears at least 75%.  FFR and DFR were discordant with mild positivity on DFR and negative FFR.   Mild nonobstructive 20% circumflex stenoses.  Probable proximal RCA spasm with narrowing up to 50% and 20% proximal and distal RCA stenoses.  Normal LV function with an ejection fraction of 65% without focal segmental wall motion abnormalities.  RECOMMENDATION: Since the patient was not on any prior anti-ischemic medications into his hospitalization, in light of the discordant flow data and large for branch diagonal vessel, the initial plan will be to attempt  aggressive medical therapy with high potency statin therapy, addition of amlodipine, nitrates and beta-blocker.  Will discontinue HCTZ.  Consider PCI if patient continues to experience recurrent symptomatology on a good medical regimen.  Recommend Aspirin 81mg  daily for moderate CAD.    Assessment:  1. Coronary artery disease of native artery of native heart with stable angina pectoris (HCC)   2. Chest pain, unspecified type   3. Essential hypertension   4. Hyperlipidemia LDL goal <70   5. Sleep apnea, unspecified type      Plan:   In order of problems listed above:  1. CAD - Prior cath in 10/2017 showed eccentric stenosis along the proximal LAD which appeared to have 30% stenosis except RAO cranial view and appeared to have 75% stenosis with discordant mildly positive DFR and negative FFR and medical management initially recommended given large diagonal branch.  - He has baseline dyspnea on exertion but this has progressed. It is possible this is secondary to deconditioning and due to weight changes but given his known CAD and associated chest tightness, need to rule-out an ischemic etiology. Will plan for a Treadmill Myoview for ischemic testing. He wishes to try walking on the treadmill but we also reviewed Lexiscan as well in case he needs to switch to this.  - Will titrate Imdur from 30mg  daily to 60mg  daily to see if this helps with symptoms. Continue ASA, Amlodipine and statin therapy. No longer on a BB given prior issues with bradycardia.   2. HTN - BP is elevated to 152/62 during today's visit. He does have a cuff at home but has not checked this regularly. He is currently on Amlodipine 10mg  daily, Imdur 30mg  daily and Losartan 100mg  daily. Will titrate Imdur as outlined above. I did encourage him to follow his BP a home. He does eat at restaurants regularly and adds salt to his food. Was encouraged to limit his salt intake to less than 2000 mg daily.   3. HLD - LDL was at  50 in 02/2019. He remains on Crestor 5mg  daily given intolerances to Atorvastatin. He does have upcoming labs with his PCP and will request a copy of records given his medication adjustments earlier this year.   4. OSA - He does have a CPAP and has experienced difficulty with feeling like this is suffocating him in the night. I encouraged him to make Neurology aware as his settings likely need to be adjusted.     Medication Adjustments/Labs and Tests Ordered: Current medicines are reviewed at length with the patient today.  Concerns regarding medicines are outlined above.  Medication changes, Labs and Tests ordered today are listed in the Patient Instructions below. Patient Instructions   Medication Instructions:  Your physician has recommended you make the following change in your medication:   Increase Imdur to 60 mg Daily    *If you need a refill on your cardiac medications before your next appointment, please call your pharmacy*   Lab Work: NONE   If you have labs (blood work) drawn today and your tests are completely normal, you will receive your results only by: Marland Kitchen MyChart Message (if you have MyChart) OR . A paper copy in the mail If you have any lab test that is abnormal or we need to change your treatment, we will call you to review the results.   Testing/Procedures: Your physician has requested that you have en exercise stress myoview. For further information please visit [email or web address]. Please follow instruction sheet, as given.     Follow-Up: At [facility], you and your health needs are our priority.  As part of our continuing mission to provide you with exceptional heart care, we have created designated Provider Care Teams.  These Care Teams include your primary Cardiologist (physician)  and Advanced Practice Providers (APPs -  Physician Assistants and Nurse Practitioners) who all work together to provide you with the care you need, when you need it.  We  recommend signing up for the patient portal called "MyChart".  Sign up information is provided on this After Visit Summary.  MyChart is used to connect with patients for Virtual Visits (Telemedicine).  Patients are able to view lab/test results, encounter notes, upcoming appointments, etc.  Non-urgent messages can be sent to your provider as well.   To learn more about what you can do with MyChart, go to [email or web address].    Your next appointment:   3 month(s)  The format for your next appointment:   In Person  Provider:   Carlyle Dolly, MD or Bernerd Pho, PA-C   Other Instructions Thank you for choosing [facility]!    Two Gram Sodium Diet 2000 mg  What is Sodium? Sodium is a mineral found naturally in many foods. The most significant source of sodium in the diet is table salt, which is about 40% sodium.  Processed, convenience, and preserved foods also contain a large amount of sodium.  The body needs only 500 mg of sodium daily to function,  A normal diet provides more than enough sodium even if you do not use salt.  Why Limit Sodium? A build up of sodium in the body can cause thirst, increased blood pressure, shortness of breath, and water retention.  Decreasing sodium in the diet can reduce edema and risk of heart attack or stroke associated with high blood pressure.  Keep in mind that there are many other factors involved in these health problems.  Heredity, obesity, lack of exercise, cigarette smoking, stress and what you eat all play a role.  General Guidelines:  Do not add salt at the table or in cooking.  One teaspoon of salt contains over 2 grams of sodium.  Read food labels  Avoid processed and convenience foods  Ask your dietitian before eating any foods not dicussed in the menu planning guidelines  Consult your physician if you wish to use a salt substitute or a sodium containing medication such as antacids.  Limit milk and milk products to 16  oz (2 cups) per day.  Shopping Hints:  READ LABELS!! "Dietetic" does not necessarily mean low sodium.  Salt and other sodium ingredients are often added to foods during processing.   Menu Planning Guidelines Food Group Choose More Often Avoid  Beverages (see also the milk group All fruit juices, low-sodium, salt-free vegetables juices, low-sodium carbonated beverages Regular vegetable or tomato juices, commercially softened water used for drinking or cooking  Breads and Cereals Enriched white, wheat, rye and pumpernickel bread, hard rolls and dinner rolls; muffins, cornbread and waffles; most dry cereals, cooked cereal without added salt; unsalted crackers and breadsticks; low sodium or homemade bread crumbs Bread, rolls and crackers with salted tops; quick breads; instant hot cereals; pancakes; commercial bread stuffing; self-rising flower and biscuit mixes; regular bread crumbs or cracker crumbs  Desserts and Sweets Desserts and sweets mad with mild should be within allowance Instant pudding mixes and cake mixes  Fats Butter or margarine; vegetable oils; unsalted salad dressings, regular salad dressings limited to 1 Tbs; light, sour and heavy cream Regular salad dressings containing bacon fat, bacon bits, and salt pork; snack dips made with instant soup mixes or processed cheese; salted nuts  Fruits Most fresh, frozen and canned fruits Fruits processed with salt or sodium-containing ingredient (some dried  fruits are processed with sodium sulfites        Vegetables Fresh, frozen vegetables and low- sodium canned vegetables Regular canned vegetables, sauerkraut, pickled vegetables, and others prepared in brine; frozen vegetables in sauces; vegetables seasoned with ham, bacon or salt pork  Condiments, Sauces, Miscellaneous  Salt substitute with physician's approval; pepper, herbs, spices; vinegar, lemon or lime juice; hot pepper sauce; garlic powder, onion powder, low sodium soy sauce (1  Tbs.); low sodium condiments (ketchup, chili sauce, mustard) in limited amounts (1 tsp.) fresh ground horseradish; unsalted tortilla chips, pretzels, potato chips, popcorn, salsa (1/4 cup) Any seasoning made with salt including garlic salt, celery salt, onion salt, and seasoned salt; sea salt, rock salt, kosher salt; meat tenderizers; monosodium glutamate; mustard, regular soy sauce, barbecue, sauce, chili sauce, teriyaki sauce, steak sauce, Worcestershire sauce, and most flavored vinegars; canned gravy and mixes; regular condiments; salted snack foods, olives, picles, relish, horseradish sauce, catsup   Food preparation: Try these seasonings Meats:    Pork Sage, onion Serve with applesauce  Chicken Poultry seasoning, thyme, parsley Serve with cranberry sauce  Lamb Curry powder, rosemary, garlic, thyme Serve with mint sauce or jelly  Veal Marjoram, basil Serve with current jelly, cranberry sauce  Beef Pepper, bay leaf Serve with dry mustard, unsalted chive butter  Fish Bay leaf, dill Serve with unsalted lemon butter, unsalted parsley butter  Vegetables:    Asparagus Lemon juice   Broccoli Lemon juice   Carrots Mustard dressing parsley, mint, nutmeg, glazed with unsalted butter and sugar   Green beans Marjoram, lemon juice, nutmeg,dill seed   Tomatoes Basil, marjoram, onion   Spice /blend for [employer]" 4 tsp ground thyme 1 tsp ground sage 3 tsp ground rosemary 4 tsp ground marjoram   Test your knowledge 1. A product that says "Salt Free" may still contain sodium. True or False 2. Garlic Powder and Hot Pepper Sauce an be used as alternative seasonings.True or False 3. Processed foods have more sodium than fresh foods.  True or False 4. Canned Vegetables have less sodium than froze True or False  WAYS TO DECREASE YOUR SODIUM INTAKE 1. Avoid the use of added salt in cooking and at the table.  Table salt (and other prepared seasonings which contain salt) is probably one of the greatest  sources of sodium in the diet.  Unsalted foods can gain flavor from the sweet, sour, and butter taste sensations of herbs and spices.  Instead of using salt for seasoning, try the following seasonings with the foods listed.  Remember: how you use them to enhance natural food flavors is limited only by your creativity... Allspice-Meat, fish, eggs, fruit, peas, red and yellow vegetables Almond Extract-Fruit baked goods Anise Seed-Sweet breads, fruit, carrots, beets, cottage cheese, cookies (tastes like licorice) Basil-Meat, fish, eggs, vegetables, rice, vegetables salads, soups, sauces Bay Leaf-Meat, fish, stews, poultry Burnet-Salad, vegetables (cucumber-like flavor) Caraway Seed-Bread, cookies, cottage cheese, meat, vegetables, cheese, rice Cardamon-Baked goods, fruit, soups Celery Powder or seed-Salads, salad dressings, sauces, meatloaf, soup, bread.Do not use  celery salt Chervil-Meats, salads, fish, eggs, vegetables, cottage cheese (parsley-like flavor) Chili Power-Meatloaf, chicken cheese, corn, eggplant, egg dishes Chives-Salads cottage cheese, egg dishes, soups, vegetables, sauces Cilantro-Salsa, casseroles Cinnamon-Baked goods, fruit, pork, lamb, chicken, carrots Cloves-Fruit, baked goods, fish, pot roast, green beans, beets, carrots Coriander-Pastry, cookies, meat, salads, cheese (lemon-orange flavor) Cumin-Meatloaf, fish,cheese, eggs, cabbage,fruit pie (caraway flavor) [employer], fruit, eggs, fish, poultry, cottage cheese, vegetables Dill Seed-Meat, cottage cheese, poultry, vegetables, fish, salads, bread Fennel Seed-Bread,  cookies, apples, pork, eggs, fish, beets, cabbage, cheese, Licorice-like flavor Garlic-(buds or powder) Salads, meat, poultry, fish, bread, butter, vegetables, potatoes.Do not  use garlic salt Ginger-Fruit, vegetables, baked goods, meat, fish, poultry Horseradish Root-Meet, vegetables, butter Lemon Juice or Extract-Vegetables, fruit, tea, baked goods, fish  salads Mace-Baked goods fruit, vegetables, fish, poultry (taste like nutmeg) Maple Extract-Syrups Marjoram-Meat, chicken, fish, vegetables, breads, green salads (taste like Sage) Mint-Tea, lamb, sherbet, vegetables, desserts, carrots, cabbage Mustard, Dry or Seed-Cheese, eggs, meats, vegetables, poultry Nutmeg-Baked goods, fruit, chicken, eggs, vegetables, desserts Onion Powder-Meat, fish, poultry, vegetables, cheese, eggs, bread, rice salads (Do not use   Onion salt) Orange Extract-Desserts, baked goods Oregano-Pasta, eggs, cheese, onions, pork, lamb, fish, chicken, vegetables, green salads Paprika-Meat, fish, poultry, eggs, cheese, vegetables Parsley Flakes-Butter, vegetables, meat fish, poultry, eggs, bread, salads (certain forms may   Contain sodium Pepper-Meat fish, poultry, vegetables, eggs Peppermint Extract-Desserts, baked goods Poppy Seed-Eggs, bread, cheese, fruit dressings, baked goods, noodles, vegetables, cottage  [employer], poultry, meat, fish, cauliflower, turnips,eggs bread Saffron-Rice, bread, veal, chicken, fish, eggs Sage-Meat, fish, poultry, onions, eggplant, tomateos, pork, stews Savory-Eggs, salads, poultry, meat, rice, vegetables, soups, pork Tarragon-Meat, poultry, fish, eggs, butter, vegetables (licorice-like flavor)  Thyme-Meat, poultry, fish, eggs, vegetables, (clover-like flavor), sauces, soups Tumeric-Salads, butter, eggs, fish, rice, vegetables (saffron-like flavor) Vanilla Extract-Baked goods, candy Vinegar-Salads, vegetables, meat marinades Walnut Extract-baked goods, candy  2. Choose your Foods Wisely   The following is a list of foods to avoid which are high in sodium:  Meats-Avoid all smoked, canned, salt cured, dried and kosher meat and fish as well as Anchovies   [employer] meats:Bologna, Liverwurst, Pastrami Canned meat or fish  Marinated herring Caviar    Pepperoni Corned  Beef   Pizza Dried chipped beef  Salami Frozen breaded fish or meat Salt pork Frankfurters or hot dogs  Sardines Gefilte fish   Sausage Ham (boiled ham, Proscuitto Smoked butt    spiced ham)   Spam      TV Dinners Vegetables Canned vegetables (Regular) Relish Canned mushrooms  Sauerkraut Olives    Tomato juice Pickles  Bakery and Dessert Products Canned puddings  Cream pies Cheesecake   Decorated cakes Cookies  Beverages/Juices Tomato juice, regular  Gatorade   V-8 vegetable juice, regular  Breads and Cereals Biscuit mixes   Salted potato chips, corn chips, pretzels Bread stuffing mixes  Salted crackers and rolls Pancake and waffle mixes Self-rising flour  Seasonings Accent    Meat sauces Barbecue sauce  Meat tenderizer Catsup    Monosodium glutamate (MSG) Celery salt   Onion salt Chili sauce   Prepared mustard Garlic salt   Salt, seasoned salt, sea salt Gravy mixes   Soy sauce Horseradish   Steak sauce Ketchup   Tartar sauce Lite salt    Teriyaki sauce Marinade mixes   Worcestershire sauce  Others Baking powder   Cocoa and cocoa mixes Baking soda   Commercial casserole mixes Candy-caramels, chocolate  Dehydrated soups    Bars, fudge,nougats  Instant rice and pasta mixes Canned broth or soup  Maraschino cherries Cheese, aged and processed cheese and cheese spreads      Signed, Erma Heritage, PA-C  09/09/2019 8:54 AM    [facility] [address] [place], [place] [address] Phone: [phone] Fax: [phone]

## 2019-09-08 NOTE — Progress Notes (Signed)
Cardiology Office Note    Date:  09/09/2019   ID:  [name], DOB [birth year], MRN [id number]  PCP:  Sharilyn Sites, MD  Cardiologist: Kate Sable, MD (Inactive)    Chief Complaint  Patient presents with  . Follow-up    6 month visit    History of Present Illness:    [name] is a 74 y.o. male with past medical history of CAD (s/p cath in 10/2017 which showed eccentric stenosis along the proximal LAD which appeared to have 30% stenosis except RAO cranial view and appeared to have 75% stenosis with discordant mildly positive DFR and negative FFR and medical management initially recommended given large diagonal branch), HTN and HLD who presents to the office today for 79-month follow-up.  He most recently had a phone visit with Dr. Bronson Ing in 02/2019 and denied any recent chest pain, dyspnea or palpitations at the time of his visit.  He did report not feeling well after taking Atorvastatin and this was discontinued with him being switched to Crestor 5 mg daily.  He was continued on Amlodipine 10 mg daily, ASA 81 mg daily, Imdur 30 mg daily and Losartan 100 mg daily.  In talking with the patient today, he reports having episodes of dyspnea with exertion for the past few years but feels like this has progressed in the past 6 months. Most noticeable when walking to his mailbox and this has an incline. Says he has gained weight and feels like this has contributed to his symptoms as well. He does describe some associated chest tightness and symptoms resolve quickly without the use of SL NTG. He does describe some chest discomfort initially when walking into [employer] but then symptoms resolve when walking out so unsure of what this is due to.   He denies any specific orthopnea, PND or edema. No recent palpitations. He does have difficulty with his CPAP at night is being followed by Dr. Brett Fairy as he feels like it is suffocating him at times.   Past Medical History:    Diagnosis Date  . CAD (coronary artery disease)    a. s/p cath in 10/2017 which showed eccentric stenosis along the proximal LAD which appeared to have 30% stenosis except RAO cranial view and appeared to have 75% stenosis with discordant mildly positive DFR and negative FFR and medical management initially recommended given large diagonal branch  . Cancer ([place]) 07/2012   prostate  . Heart disease   . Hypertension     Past Surgical History:  Procedure Laterality Date  . COLONOSCOPY N/A 08/28/2014   Procedure: COLONOSCOPY;  Surgeon: Aviva Signs, MD;  Location: [facility];  Service: Gastroenterology;  Laterality: N/A;  . INTRAVASCULAR PRESSURE WIRE/FFR STUDY N/A 11/04/2017   Procedure: INTRAVASCULAR PRESSURE WIRE/DFR vs FFR  STUDY;  Surgeon: Troy Sine, MD;  Location: [facility];  Service: Cardiovascular;  Laterality: N/A;  . LEFT HEART CATH AND CORONARY ANGIOGRAPHY N/A 11/04/2017   Procedure: LEFT HEART CATH AND CORONARY ANGIOGRAPHY;  Surgeon: Troy Sine, MD;  Location: [facility];  Service: Cardiovascular;  Laterality: N/A;  . PROSTATE SURGERY  12/2012  . SHOULDER SURGERY Bilateral     Current Medications: Outpatient Medications Prior to Visit  Medication Sig Dispense Refill  . amLODipine (NORVASC) 10 MG tablet Take 1 tablet by mouth once daily 90 tablet 3  . aspirin 81 MG tablet Take 81 mg by mouth daily.    Marland Kitchen losartan (COZAAR) 100 MG tablet  Take 1 tablet (100 mg total) by mouth daily. 90 tablet 1  . rosuvastatin (CRESTOR) 5 MG tablet Take 1 tablet (5 mg total) by mouth daily. 90 tablet 3  . isosorbide mononitrate (IMDUR) 30 MG 24 hr tablet Take 1 tablet by mouth once daily 90 tablet 3   No facility-administered medications prior to visit.     Allergies:   Penicillins   Social History   Socioeconomic History  . Marital status: Married    Spouse name: Not on file  . Number of children: Not on file  . Years of education: Not on file  . Highest  education level: Not on file  Occupational History  . Not on file  Tobacco Use  . Smoking status: Former Smoker    Packs/day: 0.25    Years: 18.00    Pack years: 4.50    Types: Cigarettes    Quit date: 1978    Years since quitting: 43.6  . Smokeless tobacco: Former Systems developer    Quit date: 02/09/1979  Vaping Use  . Vaping Use: Never used  Substance and Sexual Activity  . Alcohol use: No  . Drug use: No  . Sexual activity: Not on file  Other Topics Concern  . Not on file  Social History Narrative   Lives   Caffeine use:    Social Determinants of Health   Financial Resource Strain:   . Difficulty of Paying Living Expenses:   Food Insecurity:   . Worried About Charity fundraiser in the Last Year:   . Arboriculturist in the Last Year:   Transportation Needs:   . Film/video editor (Medical):   Marland Kitchen Lack of Transportation (Non-Medical):   Physical Activity:   . Days of Exercise per Week:   . Minutes of Exercise per Session:   Stress:   . Feeling of Stress :   Social Connections:   . Frequency of Communication with Friends and Family:   . Frequency of Social Gatherings with Friends and Family:   . Attends Religious Services:   . Active Member of Clubs or Organizations:   . Attends Archivist Meetings:   Marland Kitchen Marital Status:      Family History:  The patient's family history includes Heart attack in his father; Kidney disease in his brother; Stroke in his father and mother.   Review of Systems:   Please see the history of present illness.     General:  No chills, fever, night sweats or weight changes.  Cardiovascular:  No edema, orthopnea, palpitations, paroxysmal nocturnal dyspnea. Positive for chest tightness and dyspnea on exertion.  Dermatological: No rash, lesions/masses Respiratory: No cough, dyspnea Urologic: No hematuria, dysuria Abdominal:   No nausea, vomiting, diarrhea, bright red blood per rectum, melena, or hematemesis Neurologic:  No visual  changes, wkns, changes in mental status. All other systems reviewed and are otherwise negative except as noted above.   Physical Exam:    VS:  BP (!) 152/62   Pulse 66   Ht 5\' 6"  (1.676 m)   Wt (!) 220 lb 9.6 oz (100.1 kg)   SpO2 97%   BMI 35.61 kg/m    General: Well developed, well nourished,male appearing in no acute distress. Head: Normocephalic, atraumatic, sclera non-icteric.  Neck: No carotid bruits. JVD not elevated.  Lungs: Respirations regular and unlabored, without wheezes or rales.  Heart: Regular rate and rhythm. No S3 or S4.  No murmur, no rubs, or gallops appreciated. Abdomen: Soft,  non-tender, non-distended. No obvious abdominal masses. Msk:  Strength and tone appear normal for age. No obvious joint deformities or effusions. Extremities: No clubbing or cyanosis. No lower extremity edema.  Distal pedal pulses are 2+ bilaterally. Neuro: Alert and oriented X 3. Moves all extremities spontaneously. No focal deficits noted. Psych:  Responds to questions appropriately with a normal affect. Skin: No rashes or lesions noted  Wt Readings from Last 3 Encounters:  09/08/19 (!) 220 lb 9.6 oz (100.1 kg)  06/15/19 218 lb 9.6 oz (99.2 kg)  02/23/19 200 lb (90.7 kg)     Studies/Labs Reviewed:   EKG:  EKG is ordered today.  The ekg ordered today demonstrates NSR, HR 66 with incomplete RBBB and LVH with associated repol abnormality and TWI along anterolateral leads which is similar to prior tracings.   Recent Labs: No results found for requested labs within last 8760 hours.   Lipid Panel    Component Value Date/Time   CHOL 119 02/14/2018 1242   TRIG 88 02/14/2018 1242   HDL 51 02/14/2018 1242   CHOLHDL 2.3 02/14/2018 1242   VLDL 18 02/14/2018 1242   LDLCALC 50 02/14/2018 1242    Additional studies/ records that were reviewed today include:   Echocardiogram: 10/2017 Study Conclusions   - Left ventricle: The cavity size was normal. Systolic function was  vigorous.  The estimated ejection fraction was in the range of 65%  to 70%. Wall motion was normal; there were no regional wall  motion abnormalities. Features are consistent with a pseudonormal  left ventricular filling pattern, with concomitant abnormal  relaxation and increased filling pressure (grade 2 diastolic  dysfunction). Doppler parameters are consistent with high  ventricular filling pressure. Mild to moderate posterior wall and  severe proximal septal hypertrophy.  - Aortic valve: Mildly calcified annulus. Trileaflet. There was  mild regurgitation.  - Mitral valve: Mildly calcified annulus.  - Left atrium: The atrium was mildly dilated.  - Atrial septum: No defect or patent foramen ovale was identified.    Cardiac Catheterization: 10/2017  Ost RCA to Prox RCA lesion is 50% stenosed.  Prox RCA lesion is 20% stenosed.  Post Atrio lesion is 20% stenosed.  Prox LAD lesion is 75% stenosed.  Prox Cx to Mid Cx lesion is 10% stenosed.  1st Mrg lesion is 20% stenosed.  The left ventricular systolic function is normal.  LV end diastolic pressure is normal.  The left ventricular ejection fraction is 55-65% by visual estimate.   The proximal LAD has an eccentric stenosis in the region of a large 4 branch diagonal vessel which in most views does not appear significantly narrowed but in the RAO cranial view appears at least 75%.  FFR and DFR were discordant with mild positivity on DFR and negative FFR.   Mild nonobstructive 20% circumflex stenoses.  Probable proximal RCA spasm with narrowing up to 50% and 20% proximal and distal RCA stenoses.  Normal LV function with an ejection fraction of 65% without focal segmental wall motion abnormalities.  RECOMMENDATION: Since the patient was not on any prior anti-ischemic medications into his hospitalization, in light of the discordant flow data and large for branch diagonal vessel, the initial plan will be to attempt  aggressive medical therapy with high potency statin therapy, addition of amlodipine, nitrates and beta-blocker.  Will discontinue HCTZ.  Consider PCI if patient continues to experience recurrent symptomatology on a good medical regimen.  Recommend Aspirin 81mg  daily for moderate CAD.    Assessment:  1. Coronary artery disease of native artery of native heart with stable angina pectoris (HCC)   2. Chest pain, unspecified type   3. Essential hypertension   4. Hyperlipidemia LDL goal <70   5. Sleep apnea, unspecified type      Plan:   In order of problems listed above:  1. CAD - Prior cath in 10/2017 showed eccentric stenosis along the proximal LAD which appeared to have 30% stenosis except RAO cranial view and appeared to have 75% stenosis with discordant mildly positive DFR and negative FFR and medical management initially recommended given large diagonal branch.  - He has baseline dyspnea on exertion but this has progressed. It is possible this is secondary to deconditioning and due to weight changes but given his known CAD and associated chest tightness, need to rule-out an ischemic etiology. Will plan for a Treadmill Myoview for ischemic testing. He wishes to try walking on the treadmill but we also reviewed Lexiscan as well in case he needs to switch to this.  - Will titrate Imdur from 30mg  daily to 60mg  daily to see if this helps with symptoms. Continue ASA, Amlodipine and statin therapy. No longer on a BB given prior issues with bradycardia.   2. HTN - BP is elevated to 152/62 during today's visit. He does have a cuff at home but has not checked this regularly. He is currently on Amlodipine 10mg  daily, Imdur 30mg  daily and Losartan 100mg  daily. Will titrate Imdur as outlined above. I did encourage him to follow his BP a home. He does eat at restaurants regularly and adds salt to his food. Was encouraged to limit his salt intake to less than 2000 mg daily.   3. HLD - LDL was at  50 in 02/2019. He remains on Crestor 5mg  daily given intolerances to Atorvastatin. He does have upcoming labs with his PCP and will request a copy of records given his medication adjustments earlier this year.   4. OSA - He does have a CPAP and has experienced difficulty with feeling like this is suffocating him in the night. I encouraged him to make Neurology aware as his settings likely need to be adjusted.     Medication Adjustments/Labs and Tests Ordered: Current medicines are reviewed at length with the patient today.  Concerns regarding medicines are outlined above.  Medication changes, Labs and Tests ordered today are listed in the Patient Instructions below. Patient Instructions   Medication Instructions:  Your physician has recommended you make the following change in your medication:   Increase Imdur to 60 mg Daily    *If you need a refill on your cardiac medications before your next appointment, please call your pharmacy*   Lab Work: NONE   If you have labs (blood work) drawn today and your tests are completely normal, you will receive your results only by: Marland Kitchen MyChart Message (if you have MyChart) OR . A paper copy in the mail If you have any lab test that is abnormal or we need to change your treatment, we will call you to review the results.   Testing/Procedures: Your physician has requested that you have en exercise stress myoview. For further information please visit [email or web address]. Please follow instruction sheet, as given.     Follow-Up: At [facility], you and your health needs are our priority.  As part of our continuing mission to provide you with exceptional heart care, we have created designated Provider Care Teams.  These Care Teams include your primary Cardiologist (physician)  and Advanced Practice Providers (APPs -  Physician Assistants and Nurse Practitioners) who all work together to provide you with the care you need, when you need it.  We  recommend signing up for the patient portal called "MyChart".  Sign up information is provided on this After Visit Summary.  MyChart is used to connect with patients for Virtual Visits (Telemedicine).  Patients are able to view lab/test results, encounter notes, upcoming appointments, etc.  Non-urgent messages can be sent to your provider as well.   To learn more about what you can do with MyChart, go to [email or web address].    Your next appointment:   3 month(s)  The format for your next appointment:   In Person  Provider:   Carlyle Dolly, MD or Bernerd Pho, PA-C   Other Instructions Thank you for choosing [facility]!    Two Gram Sodium Diet 2000 mg  What is Sodium? Sodium is a mineral found naturally in many foods. The most significant source of sodium in the diet is table salt, which is about 40% sodium.  Processed, convenience, and preserved foods also contain a large amount of sodium.  The body needs only 500 mg of sodium daily to function,  A normal diet provides more than enough sodium even if you do not use salt.  Why Limit Sodium? A build up of sodium in the body can cause thirst, increased blood pressure, shortness of breath, and water retention.  Decreasing sodium in the diet can reduce edema and risk of heart attack or stroke associated with high blood pressure.  Keep in mind that there are many other factors involved in these health problems.  Heredity, obesity, lack of exercise, cigarette smoking, stress and what you eat all play a role.  General Guidelines:  Do not add salt at the table or in cooking.  One teaspoon of salt contains over 2 grams of sodium.  Read food labels  Avoid processed and convenience foods  Ask your dietitian before eating any foods not dicussed in the menu planning guidelines  Consult your physician if you wish to use a salt substitute or a sodium containing medication such as antacids.  Limit milk and milk products to 16  oz (2 cups) per day.  Shopping Hints:  READ LABELS!! "Dietetic" does not necessarily mean low sodium.  Salt and other sodium ingredients are often added to foods during processing.   Menu Planning Guidelines Food Group Choose More Often Avoid  Beverages (see also the milk group All fruit juices, low-sodium, salt-free vegetables juices, low-sodium carbonated beverages Regular vegetable or tomato juices, commercially softened water used for drinking or cooking  Breads and Cereals Enriched white, wheat, rye and pumpernickel bread, hard rolls and dinner rolls; muffins, cornbread and waffles; most dry cereals, cooked cereal without added salt; unsalted crackers and breadsticks; low sodium or homemade bread crumbs Bread, rolls and crackers with salted tops; quick breads; instant hot cereals; pancakes; commercial bread stuffing; self-rising flower and biscuit mixes; regular bread crumbs or cracker crumbs  Desserts and Sweets Desserts and sweets mad with mild should be within allowance Instant pudding mixes and cake mixes  Fats Butter or margarine; vegetable oils; unsalted salad dressings, regular salad dressings limited to 1 Tbs; light, sour and heavy cream Regular salad dressings containing bacon fat, bacon bits, and salt pork; snack dips made with instant soup mixes or processed cheese; salted nuts  Fruits Most fresh, frozen and canned fruits Fruits processed with salt or sodium-containing ingredient (some dried  fruits are processed with sodium sulfites        Vegetables Fresh, frozen vegetables and low- sodium canned vegetables Regular canned vegetables, sauerkraut, pickled vegetables, and others prepared in brine; frozen vegetables in sauces; vegetables seasoned with ham, bacon or salt pork  Condiments, Sauces, Miscellaneous  Salt substitute with physician's approval; pepper, herbs, spices; vinegar, lemon or lime juice; hot pepper sauce; garlic powder, onion powder, low sodium soy sauce (1  Tbs.); low sodium condiments (ketchup, chili sauce, mustard) in limited amounts (1 tsp.) fresh ground horseradish; unsalted tortilla chips, pretzels, potato chips, popcorn, salsa (1/4 cup) Any seasoning made with salt including garlic salt, celery salt, onion salt, and seasoned salt; sea salt, rock salt, kosher salt; meat tenderizers; monosodium glutamate; mustard, regular soy sauce, barbecue, sauce, chili sauce, teriyaki sauce, steak sauce, Worcestershire sauce, and most flavored vinegars; canned gravy and mixes; regular condiments; salted snack foods, olives, picles, relish, horseradish sauce, catsup   Food preparation: Try these seasonings Meats:    Pork Sage, onion Serve with applesauce  Chicken Poultry seasoning, thyme, parsley Serve with cranberry sauce  Lamb Curry powder, rosemary, garlic, thyme Serve with mint sauce or jelly  Veal Marjoram, basil Serve with current jelly, cranberry sauce  Beef Pepper, bay leaf Serve with dry mustard, unsalted chive butter  Fish Bay leaf, dill Serve with unsalted lemon butter, unsalted parsley butter  Vegetables:    Asparagus Lemon juice   Broccoli Lemon juice   Carrots Mustard dressing parsley, mint, nutmeg, glazed with unsalted butter and sugar   Green beans Marjoram, lemon juice, nutmeg,dill seed   Tomatoes Basil, marjoram, onion   Spice /blend for [employer]" 4 tsp ground thyme 1 tsp ground sage 3 tsp ground rosemary 4 tsp ground marjoram   Test your knowledge 1. A product that says "Salt Free" may still contain sodium. True or False 2. Garlic Powder and Hot Pepper Sauce an be used as alternative seasonings.True or False 3. Processed foods have more sodium than fresh foods.  True or False 4. Canned Vegetables have less sodium than froze True or False  WAYS TO DECREASE YOUR SODIUM INTAKE 1. Avoid the use of added salt in cooking and at the table.  Table salt (and other prepared seasonings which contain salt) is probably one of the greatest  sources of sodium in the diet.  Unsalted foods can gain flavor from the sweet, sour, and butter taste sensations of herbs and spices.  Instead of using salt for seasoning, try the following seasonings with the foods listed.  Remember: how you use them to enhance natural food flavors is limited only by your creativity... Allspice-Meat, fish, eggs, fruit, peas, red and yellow vegetables Almond Extract-Fruit baked goods Anise Seed-Sweet breads, fruit, carrots, beets, cottage cheese, cookies (tastes like licorice) Basil-Meat, fish, eggs, vegetables, rice, vegetables salads, soups, sauces Bay Leaf-Meat, fish, stews, poultry Burnet-Salad, vegetables (cucumber-like flavor) Caraway Seed-Bread, cookies, cottage cheese, meat, vegetables, cheese, rice Cardamon-Baked goods, fruit, soups Celery Powder or seed-Salads, salad dressings, sauces, meatloaf, soup, bread.Do not use  celery salt Chervil-Meats, salads, fish, eggs, vegetables, cottage cheese (parsley-like flavor) Chili Power-Meatloaf, chicken cheese, corn, eggplant, egg dishes Chives-Salads cottage cheese, egg dishes, soups, vegetables, sauces Cilantro-Salsa, casseroles Cinnamon-Baked goods, fruit, pork, lamb, chicken, carrots Cloves-Fruit, baked goods, fish, pot roast, green beans, beets, carrots Coriander-Pastry, cookies, meat, salads, cheese (lemon-orange flavor) Cumin-Meatloaf, fish,cheese, eggs, cabbage,fruit pie (caraway flavor) [employer], fruit, eggs, fish, poultry, cottage cheese, vegetables Dill Seed-Meat, cottage cheese, poultry, vegetables, fish, salads, bread Fennel Seed-Bread,  cookies, apples, pork, eggs, fish, beets, cabbage, cheese, Licorice-like flavor Garlic-(buds or powder) Salads, meat, poultry, fish, bread, butter, vegetables, potatoes.Do not  use garlic salt Ginger-Fruit, vegetables, baked goods, meat, fish, poultry Horseradish Root-Meet, vegetables, butter Lemon Juice or Extract-Vegetables, fruit, tea, baked goods, fish  salads Mace-Baked goods fruit, vegetables, fish, poultry (taste like nutmeg) Maple Extract-Syrups Marjoram-Meat, chicken, fish, vegetables, breads, green salads (taste like Sage) Mint-Tea, lamb, sherbet, vegetables, desserts, carrots, cabbage Mustard, Dry or Seed-Cheese, eggs, meats, vegetables, poultry Nutmeg-Baked goods, fruit, chicken, eggs, vegetables, desserts Onion Powder-Meat, fish, poultry, vegetables, cheese, eggs, bread, rice salads (Do not use   Onion salt) Orange Extract-Desserts, baked goods Oregano-Pasta, eggs, cheese, onions, pork, lamb, fish, chicken, vegetables, green salads Paprika-Meat, fish, poultry, eggs, cheese, vegetables Parsley Flakes-Butter, vegetables, meat fish, poultry, eggs, bread, salads (certain forms may   Contain sodium Pepper-Meat fish, poultry, vegetables, eggs Peppermint Extract-Desserts, baked goods Poppy Seed-Eggs, bread, cheese, fruit dressings, baked goods, noodles, vegetables, cottage  [employer], poultry, meat, fish, cauliflower, turnips,eggs bread Saffron-Rice, bread, veal, chicken, fish, eggs Sage-Meat, fish, poultry, onions, eggplant, tomateos, pork, stews Savory-Eggs, salads, poultry, meat, rice, vegetables, soups, pork Tarragon-Meat, poultry, fish, eggs, butter, vegetables (licorice-like flavor)  Thyme-Meat, poultry, fish, eggs, vegetables, (clover-like flavor), sauces, soups Tumeric-Salads, butter, eggs, fish, rice, vegetables (saffron-like flavor) Vanilla Extract-Baked goods, candy Vinegar-Salads, vegetables, meat marinades Walnut Extract-baked goods, candy  2. Choose your Foods Wisely   The following is a list of foods to avoid which are high in sodium:  Meats-Avoid all smoked, canned, salt cured, dried and kosher meat and fish as well as Anchovies   [employer] meats:Bologna, Liverwurst, Pastrami Canned meat or fish  Marinated herring Caviar    Pepperoni Corned  Beef   Pizza Dried chipped beef  Salami Frozen breaded fish or meat Salt pork Frankfurters or hot dogs  Sardines Gefilte fish   Sausage Ham (boiled ham, Proscuitto Smoked butt    spiced ham)   Spam      TV Dinners Vegetables Canned vegetables (Regular) Relish Canned mushrooms  Sauerkraut Olives    Tomato juice Pickles  Bakery and Dessert Products Canned puddings  Cream pies Cheesecake   Decorated cakes Cookies  Beverages/Juices Tomato juice, regular  Gatorade   V-8 vegetable juice, regular  Breads and Cereals Biscuit mixes   Salted potato chips, corn chips, pretzels Bread stuffing mixes  Salted crackers and rolls Pancake and waffle mixes Self-rising flour  Seasonings Accent    Meat sauces Barbecue sauce  Meat tenderizer Catsup    Monosodium glutamate (MSG) Celery salt   Onion salt Chili sauce   Prepared mustard Garlic salt   Salt, seasoned salt, sea salt Gravy mixes   Soy sauce Horseradish   Steak sauce Ketchup   Tartar sauce Lite salt    Teriyaki sauce Marinade mixes   Worcestershire sauce  Others Baking powder   Cocoa and cocoa mixes Baking soda   Commercial casserole mixes Candy-caramels, chocolate  Dehydrated soups    Bars, fudge,nougats  Instant rice and pasta mixes Canned broth or soup  Maraschino cherries Cheese, aged and processed cheese and cheese spreads      Signed, Erma Heritage, PA-C  09/09/2019 8:54 AM    [facility] [address] [place], [place] [address] Phone: [phone] Fax: [phone]

## 2019-09-09 ENCOUNTER — Encounter: Payer: Self-pay | Admitting: Student

## 2019-09-20 ENCOUNTER — Encounter (HOSPITAL_COMMUNITY): Admission: RE | Admit: 2019-09-20 | Payer: Medicare Other | Source: Ambulatory Visit

## 2019-09-20 ENCOUNTER — Encounter (HOSPITAL_COMMUNITY): Payer: Medicare Other

## 2019-09-25 ENCOUNTER — Encounter (HOSPITAL_COMMUNITY): Payer: Self-pay

## 2019-09-25 ENCOUNTER — Other Ambulatory Visit: Payer: Self-pay

## 2019-09-25 ENCOUNTER — Encounter (HOSPITAL_BASED_OUTPATIENT_CLINIC_OR_DEPARTMENT_OTHER)
Admission: RE | Admit: 2019-09-25 | Discharge: 2019-09-25 | Disposition: A | Discharge status: Home / Self Care | Payer: Medicare Other | Source: Ambulatory Visit | Attending: Student | Admitting: Student

## 2019-09-25 ENCOUNTER — Encounter (HOSPITAL_COMMUNITY)
Admission: RE | Admit: 2019-09-25 | Discharge: 2019-09-25 | Disposition: A | Discharge status: Home / Self Care | Payer: Medicare Other | Source: Ambulatory Visit | Attending: Student | Admitting: Student

## 2019-09-25 DIAGNOSIS — R079 Chest pain, unspecified: Secondary | ICD-10-CM | POA: Insufficient documentation

## 2019-09-25 LAB — NM MYOCAR MULTI W/SPECT W/WALL MOTION / EF
Estimated workload: 7 METS
Exercise duration (min): 4 min
Exercise duration (sec): 0 s
LV dias vol: 114 mL (ref 62–150)
LV sys vol: 54 mL
MPHR: 145 {beats}/min
Peak HR: 134 {beats}/min
Percent HR: 92 %
RATE: 0.43
RPE: 13
Rest HR: 62 {beats}/min
SDS: 0
SRS: 3
SSS: 3
TID: 1.29

## 2019-09-25 MED ORDER — TECHNETIUM TC 99M TETROFOSMIN IV KIT
30.0000 | PACK | Freq: Once | INTRAVENOUS | Status: AC | PRN
  Administered 2019-09-25: 31 via INTRAVENOUS

## 2019-09-25 MED ORDER — REGADENOSON 0.4 MG/5ML IV SOLN
INTRAVENOUS | Status: AC
  Filled 2019-09-25: qty 5

## 2019-09-25 MED ORDER — SODIUM CHLORIDE FLUSH 0.9 % IV SOLN
INTRAVENOUS | Status: AC
  Administered 2019-09-25: 10 mL via INTRAVENOUS
  Filled 2019-09-25: qty 10

## 2019-09-25 MED ORDER — TECHNETIUM TC 99M TETROFOSMIN IV KIT
10.0000 | PACK | Freq: Once | INTRAVENOUS | Status: AC | PRN
  Administered 2019-09-25: 9.62 via INTRAVENOUS

## 2019-09-27 ENCOUNTER — Telehealth: Payer: Self-pay | Admitting: Student

## 2019-09-27 DIAGNOSIS — I25118 Atherosclerotic heart disease of native coronary artery with other forms of angina pectoris: Secondary | ICD-10-CM

## 2019-09-27 DIAGNOSIS — Z01818 Encounter for other preprocedural examination: Secondary | ICD-10-CM

## 2019-09-27 NOTE — Addendum Note (Signed)
Addended by: Levonne Hubert on: 09/27/2019 05:20 PM   Modules accepted: Orders

## 2019-09-27 NOTE — Telephone Encounter (Signed)
    The patient's stress test from 09/25/2019 was a high risk study. Previously reviewed with Dr. Domenic Polite (DOD) as the patient is a former patient of Dr. Bronson Ing and he was in agreement for a cardiac catheterization for definitive evaluation.    I called the patient today to review results and he is in agreement to proceed with a cardiac catheterization. The patient understands that risks include but are not limited to stroke (1 in 1000), death (1 in 82), kidney failure [usually temporary] (1 in 500), bleeding (1 in 200), allergic reaction [possibly serious] (1 in 200).    Procedure is scheduled for Monday, 10/02/2019 at 10:30 AM with Dr. Saunders Revel. Will need to arrive to [facility] at 8:30 AM. Will need CBC/BMET along with COVID testing this week. Please refer to his office visit from 09/08/2019 as the H&P.   Signed, Erma Heritage, PA-C 09/27/2019, 5:02 PM Pager: [phone]

## 2019-09-28 ENCOUNTER — Encounter: Payer: Self-pay | Admitting: *Deleted

## 2019-09-28 ENCOUNTER — Telehealth: Payer: Self-pay | Admitting: Cardiology

## 2019-09-28 ENCOUNTER — Other Ambulatory Visit: Payer: Self-pay

## 2019-09-28 ENCOUNTER — Other Ambulatory Visit (HOSPITAL_COMMUNITY)
Admission: RE | Admit: 2019-09-28 | Discharge: 2019-09-28 | Disposition: A | Discharge status: Home / Self Care | Payer: Medicare Other | Source: Ambulatory Visit | Attending: Student | Admitting: Student

## 2019-09-28 ENCOUNTER — Telehealth: Payer: Self-pay | Admitting: *Deleted

## 2019-09-28 DIAGNOSIS — Z01818 Encounter for other preprocedural examination: Secondary | ICD-10-CM | POA: Diagnosis not present

## 2019-09-28 DIAGNOSIS — I25118 Atherosclerotic heart disease of native coronary artery with other forms of angina pectoris: Secondary | ICD-10-CM | POA: Insufficient documentation

## 2019-09-28 LAB — CBC
HCT: 41.5 % (ref 39.0–52.0)
Hemoglobin: 14.1 g/dL (ref 13.0–17.0)
MCH: 32.7 pg (ref 26.0–34.0)
MCHC: 34 g/dL (ref 30.0–36.0)
MCV: 96.3 fL (ref 80.0–100.0)
Platelets: 231 10*3/uL (ref 150–400)
RBC: 4.31 MIL/uL (ref 4.22–5.81)
RDW: 12.8 % (ref 11.5–15.5)
WBC: 6.9 10*3/uL (ref 4.0–10.5)
nRBC: 0 % (ref 0.0–0.2)

## 2019-09-28 LAB — BASIC METABOLIC PANEL
Anion gap: 10 (ref 5–15)
BUN: 13 mg/dL (ref 8–23)
CO2: 22 mmol/L (ref 22–32)
Calcium: 9.4 mg/dL (ref 8.9–10.3)
Chloride: 108 mmol/L (ref 98–111)
Creatinine, Ser: 0.86 mg/dL (ref 0.61–1.24)
GFR calc Af Amer: >60 mL/min (ref >60)
GFR calc non Af Amer: >60 mL/min (ref >60)
Glucose, Bld: 120 mg/dL — ABNORMAL HIGH (ref 70–99)
Potassium: 3.8 mmol/L (ref 3.5–5.1)
Sodium: 140 mmol/L (ref 135–145)

## 2019-09-28 NOTE — Telephone Encounter (Signed)
Pt states he has an upcoming CATH planned. He was speaking with someone over the phone and was disconnected.   Please call again [phone]   Thanks renee

## 2019-09-28 NOTE — Telephone Encounter (Signed)
Pt contacted pre-catheterization scheduled at [facility] for: Monday October 02, 2019 10:30 AM Verified arrival time and place: [facility]) at: 8:30 AM   No solid food after midnight prior to cath, clear liquids until 5 AM day of procedure.  AM meds can be  taken pre-cath with sips of water including: ASA 81 mg   Confirmed patient has responsible adult to drive home post procedure and observe 24 hours after arriving home: yes  You are allowed ONE visitor in the waiting room during your procedure. Both you and your visitor must wear a mask once you enter the hospital.       COVID-19 Pre-Screening Questions:  . In the past 14 days have you had a new cough, shortness of breath, headache, congestion, fever (100 or greater) unexplained body aches, new sore throat, or sudden loss of taste or sense of smell? no . In the past 14  days have you been around anyone with known Covid 19? no . Have you been vaccinated for COVID-19? no    Reviewed procedure/mask/visitor instructions, COVID-19 questions with patient.

## 2019-09-28 NOTE — Telephone Encounter (Signed)
Pt instructed on when and where to have Covid and lab work done.

## 2019-09-28 NOTE — Telephone Encounter (Signed)
Cath instructions reviewed with pt.

## 2019-09-29 ENCOUNTER — Other Ambulatory Visit (HOSPITAL_COMMUNITY)
Admission: RE | Admit: 2019-09-29 | Discharge: 2019-09-29 | Disposition: A | Discharge status: Home / Self Care | Payer: Medicare Other | Source: Ambulatory Visit | Attending: Internal Medicine | Admitting: Internal Medicine

## 2019-09-29 DIAGNOSIS — Z01812 Encounter for preprocedural laboratory examination: Secondary | ICD-10-CM | POA: Diagnosis not present

## 2019-09-29 DIAGNOSIS — Z20822 Contact with and (suspected) exposure to covid-19: Secondary | ICD-10-CM | POA: Insufficient documentation

## 2019-09-29 LAB — SARS CORONAVIRUS 2 (TAT 6-24 HRS): SARS Coronavirus 2: NEGATIVE

## 2019-09-30 DIAGNOSIS — G4733 Obstructive sleep apnea (adult) (pediatric): Secondary | ICD-10-CM | POA: Diagnosis not present

## 2019-10-02 ENCOUNTER — Encounter (HOSPITAL_COMMUNITY): Payer: Self-pay | Admitting: Internal Medicine

## 2019-10-02 ENCOUNTER — Ambulatory Visit (HOSPITAL_COMMUNITY)
Admission: RE | Admit: 2019-10-02 | Discharge: 2019-10-03 | Disposition: A | Discharge status: Home / Self Care | Payer: Medicare Other | Attending: Internal Medicine | Admitting: Internal Medicine

## 2019-10-02 ENCOUNTER — Other Ambulatory Visit: Payer: Self-pay

## 2019-10-02 ENCOUNTER — Ambulatory Visit (HOSPITAL_COMMUNITY)
Admission: RE | Disposition: A | Discharge status: Home / Self Care | Payer: Self-pay | Source: Home / Self Care | Attending: Internal Medicine

## 2019-10-02 DIAGNOSIS — I421 Obstructive hypertrophic cardiomyopathy: Secondary | ICD-10-CM | POA: Insufficient documentation

## 2019-10-02 DIAGNOSIS — I5032 Chronic diastolic (congestive) heart failure: Secondary | ICD-10-CM | POA: Insufficient documentation

## 2019-10-02 DIAGNOSIS — R079 Chest pain, unspecified: Secondary | ICD-10-CM | POA: Diagnosis present

## 2019-10-02 DIAGNOSIS — Z8546 Personal history of malignant neoplasm of prostate: Secondary | ICD-10-CM | POA: Diagnosis not present

## 2019-10-02 DIAGNOSIS — Z7982 Long term (current) use of aspirin: Secondary | ICD-10-CM | POA: Insufficient documentation

## 2019-10-02 DIAGNOSIS — Z87891 Personal history of nicotine dependence: Secondary | ICD-10-CM | POA: Insufficient documentation

## 2019-10-02 DIAGNOSIS — E785 Hyperlipidemia, unspecified: Secondary | ICD-10-CM | POA: Diagnosis not present

## 2019-10-02 DIAGNOSIS — G4733 Obstructive sleep apnea (adult) (pediatric): Secondary | ICD-10-CM | POA: Insufficient documentation

## 2019-10-02 DIAGNOSIS — R0609 Other forms of dyspnea: Secondary | ICD-10-CM | POA: Insufficient documentation

## 2019-10-02 DIAGNOSIS — Z955 Presence of coronary angioplasty implant and graft: Secondary | ICD-10-CM | POA: Insufficient documentation

## 2019-10-02 DIAGNOSIS — R001 Bradycardia, unspecified: Secondary | ICD-10-CM | POA: Insufficient documentation

## 2019-10-02 DIAGNOSIS — I25118 Atherosclerotic heart disease of native coronary artery with other forms of angina pectoris: Secondary | ICD-10-CM | POA: Diagnosis not present

## 2019-10-02 DIAGNOSIS — I110 Hypertensive heart disease with heart failure: Secondary | ICD-10-CM | POA: Insufficient documentation

## 2019-10-02 DIAGNOSIS — I10 Essential (primary) hypertension: Secondary | ICD-10-CM | POA: Diagnosis present

## 2019-10-02 DIAGNOSIS — Z79899 Other long term (current) drug therapy: Secondary | ICD-10-CM | POA: Diagnosis not present

## 2019-10-02 DIAGNOSIS — Z880 Allergy status to penicillin: Secondary | ICD-10-CM | POA: Diagnosis not present

## 2019-10-02 HISTORY — PX: INTRAVASCULAR PRESSURE WIRE/FFR STUDY: CATH118243

## 2019-10-02 HISTORY — PX: LEFT HEART CATH AND CORONARY ANGIOGRAPHY: CATH118249

## 2019-10-02 HISTORY — PX: CORONARY STENT INTERVENTION: CATH118234

## 2019-10-02 LAB — POCT ACTIVATED CLOTTING TIME
Activated Clotting Time: 224 seconds
Activated Clotting Time: 252 seconds
Activated Clotting Time: 296 seconds

## 2019-10-02 SURGERY — LEFT HEART CATH AND CORONARY ANGIOGRAPHY
Anesthesia: LOCAL

## 2019-10-02 MED ORDER — MIDAZOLAM HCL 2 MG/2ML IJ SOLN
INTRAMUSCULAR | Status: DC | PRN
  Administered 2019-10-02: 1 mg via INTRAVENOUS

## 2019-10-02 MED ORDER — ROSUVASTATIN CALCIUM 5 MG PO TABS
5.0000 mg | ORAL_TABLET | Freq: Every day | ORAL | Status: DC
  Administered 2019-10-02 – 2019-10-03 (×2): 5 mg via ORAL
  Filled 2019-10-02 (×2): qty 1

## 2019-10-02 MED ORDER — VERAPAMIL HCL 2.5 MG/ML IV SOLN
INTRAVENOUS | Status: AC
  Filled 2019-10-02: qty 2

## 2019-10-02 MED ORDER — ENOXAPARIN SODIUM 40 MG/0.4ML ~~LOC~~ SOLN
40.0000 mg | SUBCUTANEOUS | Status: DC
  Administered 2019-10-03: 40 mg via SUBCUTANEOUS
  Filled 2019-10-02: qty 0.4

## 2019-10-02 MED ORDER — HEPARIN SODIUM (PORCINE) 1000 UNIT/ML IJ SOLN
INTRAMUSCULAR | Status: DC | PRN
  Administered 2019-10-02: 3000 [IU] via INTRAVENOUS
  Administered 2019-10-02: 4500 [IU] via INTRAVENOUS
  Administered 2019-10-02: 3000 [IU] via INTRAVENOUS
  Administered 2019-10-02: 4500 [IU] via INTRAVENOUS

## 2019-10-02 MED ORDER — FENTANYL CITRATE (PF) 100 MCG/2ML IJ SOLN
INTRAMUSCULAR | Status: AC
  Filled 2019-10-02: qty 2

## 2019-10-02 MED ORDER — ACETAMINOPHEN 325 MG PO TABS: 650.0000 mg | ORAL_TABLET | ORAL | Status: DC | PRN

## 2019-10-02 MED ORDER — NITROGLYCERIN 1 MG/10 ML FOR IR/CATH LAB
INTRA_ARTERIAL | Status: DC | PRN
  Administered 2019-10-02: 200 ug
  Administered 2019-10-02: 200 ug via INTRACORONARY

## 2019-10-02 MED ORDER — IOHEXOL 350 MG/ML SOLN
INTRAVENOUS | Status: DC | PRN
  Administered 2019-10-02: 175 mL

## 2019-10-02 MED ORDER — ASPIRIN EC 81 MG PO TBEC
81.0000 mg | DELAYED_RELEASE_TABLET | Freq: Every day | ORAL | Status: DC
  Administered 2019-10-03: 81 mg via ORAL
  Filled 2019-10-02: qty 1

## 2019-10-02 MED ORDER — LABETALOL HCL 5 MG/ML IV SOLN
INTRAVENOUS | Status: AC
  Filled 2019-10-02: qty 4

## 2019-10-02 MED ORDER — LOSARTAN POTASSIUM 50 MG PO TABS: 100.0000 mg | ORAL_TABLET | Freq: Every day | ORAL | Status: DC

## 2019-10-02 MED ORDER — LIDOCAINE HCL (PF) 1 % IJ SOLN
INTRAMUSCULAR | Status: AC
  Filled 2019-10-02: qty 30

## 2019-10-02 MED ORDER — LABETALOL HCL 5 MG/ML IV SOLN
10.0000 mg | INTRAVENOUS | Status: AC | PRN
  Administered 2019-10-02: 10 mg via INTRAVENOUS

## 2019-10-02 MED ORDER — FAMOTIDINE IN NACL 20-0.9 MG/50ML-% IV SOLN
INTRAVENOUS | Status: AC
  Filled 2019-10-02: qty 50

## 2019-10-02 MED ORDER — HEPARIN SODIUM (PORCINE) 1000 UNIT/ML IJ SOLN
INTRAMUSCULAR | Status: AC
  Filled 2019-10-02: qty 1

## 2019-10-02 MED ORDER — ONDANSETRON HCL 4 MG/2ML IJ SOLN: 4.0000 mg | Freq: Four times a day (QID) | INTRAMUSCULAR | Status: DC | PRN

## 2019-10-02 MED ORDER — CLOPIDOGREL BISULFATE 300 MG PO TABS
ORAL_TABLET | ORAL | Status: DC | PRN
  Administered 2019-10-02: 600 mg via ORAL

## 2019-10-02 MED ORDER — VERAPAMIL HCL 2.5 MG/ML IV SOLN
INTRAVENOUS | Status: DC | PRN
  Administered 2019-10-02 (×2): 10 mL via INTRA_ARTERIAL

## 2019-10-02 MED ORDER — SODIUM CHLORIDE 0.9 % WEIGHT BASED INFUSION: 1.0000 mL/kg/h | INTRAVENOUS | Status: DC

## 2019-10-02 MED ORDER — SODIUM CHLORIDE 0.9 % IV SOLN: 250.0000 mL | INTRAVENOUS | Status: DC | PRN

## 2019-10-02 MED ORDER — SODIUM CHLORIDE 0.9% FLUSH: 3.0000 mL | Freq: Two times a day (BID) | INTRAVENOUS | Status: DC

## 2019-10-02 MED ORDER — SODIUM CHLORIDE 0.9% FLUSH: 3.0000 mL | INTRAVENOUS | Status: DC | PRN

## 2019-10-02 MED ORDER — MIDAZOLAM HCL 2 MG/2ML IJ SOLN
INTRAMUSCULAR | Status: AC
  Filled 2019-10-02: qty 2

## 2019-10-02 MED ORDER — ASPIRIN 81 MG PO CHEW: 81.0000 mg | CHEWABLE_TABLET | ORAL | Status: DC

## 2019-10-02 MED ORDER — ANGIOPLASTY BOOK
Freq: Once | Status: AC
  Filled 2019-10-02: qty 1

## 2019-10-02 MED ORDER — FAMOTIDINE IN NACL 20-0.9 MG/50ML-% IV SOLN
INTRAVENOUS | Status: AC | PRN
  Administered 2019-10-02: 20 mg via INTRAVENOUS

## 2019-10-02 MED ORDER — CLOPIDOGREL BISULFATE 300 MG PO TABS
ORAL_TABLET | ORAL | Status: AC
  Filled 2019-10-02: qty 2

## 2019-10-02 MED ORDER — AMLODIPINE BESYLATE 10 MG PO TABS
10.0000 mg | ORAL_TABLET | Freq: Every day | ORAL | Status: DC
  Administered 2019-10-02: 10 mg via ORAL
  Filled 2019-10-02: qty 1

## 2019-10-02 MED ORDER — HYDRALAZINE HCL 20 MG/ML IJ SOLN: 10.0000 mg | INTRAMUSCULAR | Status: AC | PRN

## 2019-10-02 MED ORDER — HEPARIN (PORCINE) IN NACL 1000-0.9 UT/500ML-% IV SOLN
INTRAVENOUS | Status: AC
  Filled 2019-10-02: qty 1000

## 2019-10-02 MED ORDER — SODIUM CHLORIDE 0.9% FLUSH
3.0000 mL | Freq: Two times a day (BID) | INTRAVENOUS | Status: DC
  Administered 2019-10-02 – 2019-10-03 (×2): 3 mL via INTRAVENOUS

## 2019-10-02 MED ORDER — SODIUM CHLORIDE 0.9 % IV SOLN
INTRAVENOUS | Status: AC
  Administered 2019-10-02: 75 mL/h via INTRAVENOUS

## 2019-10-02 MED ORDER — HEPARIN (PORCINE) IN NACL 1000-0.9 UT/500ML-% IV SOLN
INTRAVENOUS | Status: DC | PRN
  Administered 2019-10-02: 500 mL

## 2019-10-02 MED ORDER — NITROGLYCERIN IN D5W 200-5 MCG/ML-% IV SOLN
INTRAVENOUS | Status: AC
  Filled 2019-10-02: qty 250

## 2019-10-02 MED ORDER — SODIUM CHLORIDE 0.9 % WEIGHT BASED INFUSION: 3.0000 mL/kg/h | INTRAVENOUS | Status: DC

## 2019-10-02 MED ORDER — METOPROLOL SUCCINATE ER 25 MG PO TB24
12.5000 mg | ORAL_TABLET | Freq: Every day | ORAL | Status: DC
  Administered 2019-10-02 – 2019-10-03 (×2): 12.5 mg via ORAL
  Filled 2019-10-02 (×2): qty 1

## 2019-10-02 MED ORDER — LIDOCAINE HCL (PF) 1 % IJ SOLN
INTRAMUSCULAR | Status: DC | PRN
  Administered 2019-10-02: 2 mL

## 2019-10-02 MED ORDER — CLOPIDOGREL BISULFATE 75 MG PO TABS
75.0000 mg | ORAL_TABLET | Freq: Every day | ORAL | Status: DC
  Administered 2019-10-03: 75 mg via ORAL
  Filled 2019-10-02: qty 1

## 2019-10-02 MED ORDER — HEPARIN (PORCINE) IN NACL 1000-0.9 UT/500ML-% IV SOLN
INTRAVENOUS | Status: AC
  Filled 2019-10-02: qty 500

## 2019-10-02 MED ORDER — FENTANYL CITRATE (PF) 100 MCG/2ML IJ SOLN
INTRAMUSCULAR | Status: DC | PRN
Start: 2019-10-02 — End: 2019-10-02
  Administered 2019-10-02: 50 ug via INTRAVENOUS

## 2019-10-02 SURGICAL SUPPLY — 25 items
BALLN EMERGE MR 2.5X8 (BALLOONS) ×2
BALLN SAPPHIRE ~~LOC~~ 3.25X12 (BALLOONS) ×1 IMPLANT
BALLOON EMERGE MR 2.5X8 (BALLOONS) IMPLANT
CATH 5FR JL3.5 JR4 ANG PIG MP (CATHETERS) ×1 IMPLANT
CATH INFINITI 5FR JR4 125CM (CATHETERS) ×1 IMPLANT
CATH INFINITI MULTIPACK ANG 4F (CATHETERS) ×1 IMPLANT
CATH LAUNCHER 5F JR4 (CATHETERS) ×1 IMPLANT
CATH LAUNCHER 6FR EBU3.5 (CATHETERS) ×1 IMPLANT
DEVICE RAD COMP TR BAND LRG (VASCULAR PRODUCTS) ×1 IMPLANT
GLIDESHEATH SLEND SS 6F .021 (SHEATH) ×2 IMPLANT
GUIDEWIRE INQWIRE 1.5J.035X260 (WIRE) ×1 IMPLANT
GUIDEWIRE PRESSURE COMET II (WIRE) ×1 IMPLANT
INQWIRE 1.5J .035X260CM (WIRE) ×2
KIT ENCORE 26 ADVANTAGE (KITS) ×1 IMPLANT
KIT ESSENTIALS PG (KITS) ×1 IMPLANT
KIT HEART LEFT (KITS) ×2 IMPLANT
PACK CARDIAC CATHETERIZATION (CUSTOM PROCEDURE TRAY) ×2 IMPLANT
SHEATH 6FR 85 DEST SLENDER (SHEATH) ×1 IMPLANT
STENT RESOLUTE ONYX 3.0X15 (Permanent Stent) ×1 IMPLANT
TRANSDUCER W/STOPCOCK (MISCELLANEOUS) ×3 IMPLANT
TUBING ART PRESS 72  MALE/FEM (TUBING) ×2
TUBING ART PRESS 72 MALE/FEM (TUBING) IMPLANT
TUBING CIL FLEX 10 FLL-RA (TUBING) ×2 IMPLANT
WIRE HI TORQ BMW 190CM (WIRE) ×1 IMPLANT
WIRE RUNTHROUGH .014X180CM (WIRE) ×1 IMPLANT

## 2019-10-02 NOTE — Interval H&P Note (Signed)
History and Physical Interval Note:  10/02/2019 12:31 PM  [name]  has presented today for surgery, with the diagnosis of dyspnea on exertion abnormal stress test.  The various methods of treatment have been discussed with the patient and family. After consideration of risks, benefits and other options for treatment, the patient has consented to  Procedure(s): LEFT HEART CATH AND CORONARY ANGIOGRAPHY (N/A) as a surgical intervention.  The patient's history has been reviewed, patient examined, no change in status, stable for surgery.  I have reviewed the patient's chart and labs.  Questions were answered to the patient's satisfaction.    Cath Lab Visit (complete for each Cath Lab visit)  Clinical Evaluation Leading to the Procedure:   ACS: No.  Non-ACS:    Anginal Classification: CCS IV  Anti-ischemic medical therapy: Maximal Therapy (2 or more classes of medications)  Non-Invasive Test Results: High-risk stress test findings: cardiac mortality >3%/year  Prior CABG: No previous CABG  Raffaela Ladley

## 2019-10-02 NOTE — Progress Notes (Signed)
Patient arrived from cath lab at 1820hrs.  Right radial site level 0 with gauze and tegaderm in place.  + 2 radial pulse.  Oriented to unit and given post cath instructions.  Patient verbalized understanding.

## 2019-10-02 NOTE — Brief Op Note (Signed)
BRIEF CARDIAC CATHETERIZATION NOTE  10/02/2019  3:21 PM  PATIENT:  [name]  74 y.o. male  PRE-OPERATIVE DIAGNOSIS:  Stable angina and abnormal stress test  POST-OPERATIVE DIAGNOSIS:  Severe single-vessel CAD and HOCM.  PROCEDURE:  Procedure(s): LEFT HEART CATH AND CORONARY ANGIOGRAPHY (N/A) CORONARY STENT INTERVENTION (N/A) INTRAVASCULAR PRESSURE WIRE/FFR STUDY (N/A)  SURGEON:  Surgeon(s) and Role:    * Cornell Bourbon, MD - Primary  FINDING: 1. Severe single vessel CAD with 80% proximal LAD stenosis at takeoff of large D1 branch. 2. Moderate, nonobstructive ostial RCA disease (DFR = 0.97). 3. Hyperdynamic LV contraction with dynamic mid-cavitary gradient (post-PVC gradient up to ~110 mmHg). 4. Mildly elevated left ventricular filling pressure (LVEDP 15-20 mmHg). 5. Successful PCI to proximal LAD using Resolute Onyx 3.0 x 15 mm DES with 0% residual stenosis and TIMI-3 flow.  RECOMMENDATIONS: 1. Overnight extended recovery. 2. DAPT with aspirin and clopidogrel for at least 6 months. 3. Aggressive secondary prevention. 4. Hold isosorbide mononitrate and add metoprolol succinate 12.5 mg daily for BP and heart rate control in the setting of HOCM. 5. Consider repeat outpatient echo versus cardiac MRI for further evaluation of HOCM.  Nelva Bush, MD [facility]

## 2019-10-03 ENCOUNTER — Encounter (HOSPITAL_COMMUNITY): Payer: Self-pay | Admitting: Internal Medicine

## 2019-10-03 ENCOUNTER — Ambulatory Visit: Payer: Self-pay | Admitting: Neurology

## 2019-10-03 ENCOUNTER — Ambulatory Visit (HOSPITAL_BASED_OUTPATIENT_CLINIC_OR_DEPARTMENT_OTHER): Payer: Medicare Other

## 2019-10-03 DIAGNOSIS — Z87891 Personal history of nicotine dependence: Secondary | ICD-10-CM | POA: Diagnosis not present

## 2019-10-03 DIAGNOSIS — Z880 Allergy status to penicillin: Secondary | ICD-10-CM | POA: Diagnosis not present

## 2019-10-03 DIAGNOSIS — I25118 Atherosclerotic heart disease of native coronary artery with other forms of angina pectoris: Secondary | ICD-10-CM | POA: Diagnosis not present

## 2019-10-03 DIAGNOSIS — Z7982 Long term (current) use of aspirin: Secondary | ICD-10-CM | POA: Diagnosis not present

## 2019-10-03 DIAGNOSIS — I421 Obstructive hypertrophic cardiomyopathy: Secondary | ICD-10-CM | POA: Diagnosis not present

## 2019-10-03 DIAGNOSIS — Z79899 Other long term (current) drug therapy: Secondary | ICD-10-CM | POA: Diagnosis not present

## 2019-10-03 DIAGNOSIS — I110 Hypertensive heart disease with heart failure: Secondary | ICD-10-CM | POA: Diagnosis not present

## 2019-10-03 DIAGNOSIS — E7849 Other hyperlipidemia: Secondary | ICD-10-CM | POA: Diagnosis not present

## 2019-10-03 DIAGNOSIS — I5032 Chronic diastolic (congestive) heart failure: Secondary | ICD-10-CM | POA: Diagnosis not present

## 2019-10-03 DIAGNOSIS — E785 Hyperlipidemia, unspecified: Secondary | ICD-10-CM | POA: Diagnosis not present

## 2019-10-03 DIAGNOSIS — R001 Bradycardia, unspecified: Secondary | ICD-10-CM | POA: Diagnosis not present

## 2019-10-03 DIAGNOSIS — R0609 Other forms of dyspnea: Secondary | ICD-10-CM | POA: Diagnosis not present

## 2019-10-03 DIAGNOSIS — I10 Essential (primary) hypertension: Secondary | ICD-10-CM | POA: Diagnosis not present

## 2019-10-03 DIAGNOSIS — G4733 Obstructive sleep apnea (adult) (pediatric): Secondary | ICD-10-CM | POA: Diagnosis not present

## 2019-10-03 DIAGNOSIS — Z955 Presence of coronary angioplasty implant and graft: Secondary | ICD-10-CM | POA: Diagnosis not present

## 2019-10-03 LAB — CBC
HCT: 41.3 % (ref 39.0–52.0)
Hemoglobin: 14 g/dL (ref 13.0–17.0)
MCH: 31.8 pg (ref 26.0–34.0)
MCHC: 33.9 g/dL (ref 30.0–36.0)
MCV: 93.9 fL (ref 80.0–100.0)
Platelets: 228 10*3/uL (ref 150–400)
RBC: 4.4 MIL/uL (ref 4.22–5.81)
RDW: 12.8 % (ref 11.5–15.5)
WBC: 9.1 10*3/uL (ref 4.0–10.5)
nRBC: 0 % (ref 0.0–0.2)

## 2019-10-03 LAB — BASIC METABOLIC PANEL
Anion gap: 10 (ref 5–15)
BUN: 10 mg/dL (ref 8–23)
CO2: 23 mmol/L (ref 22–32)
Calcium: 9.1 mg/dL (ref 8.9–10.3)
Chloride: 107 mmol/L (ref 98–111)
Creatinine, Ser: 0.87 mg/dL (ref 0.61–1.24)
GFR calc Af Amer: >60 mL/min (ref >60)
GFR calc non Af Amer: >60 mL/min (ref >60)
Glucose, Bld: 95 mg/dL (ref 70–99)
Potassium: 4 mmol/L (ref 3.5–5.1)
Sodium: 140 mmol/L (ref 135–145)

## 2019-10-03 LAB — LIPID PANEL
Cholesterol: 113 mg/dL (ref 0–200)
HDL: 42 mg/dL (ref >40)
LDL Cholesterol: 53 mg/dL (ref 0–99)
Total CHOL/HDL Ratio: 2.7 RATIO
Triglycerides: 92 mg/dL (ref <150)
VLDL: 18 mg/dL (ref 0–40)

## 2019-10-03 LAB — ECHOCARDIOGRAM COMPLETE
Area-P 1/2: 2.99 cm2
Height: 66 in
S' Lateral: 2.1 cm
Weight: 3174.62 oz

## 2019-10-03 MED ORDER — AMLODIPINE BESYLATE 10 MG PO TABS
10.0000 mg | ORAL_TABLET | Freq: Every day | ORAL | Status: DC
  Administered 2019-10-03: 10 mg via ORAL
  Filled 2019-10-03: qty 1

## 2019-10-03 MED ORDER — HYDROCHLOROTHIAZIDE 12.5 MG PO CAPS: 12.5000 mg | ORAL_CAPSULE | Freq: Every day | ORAL | 3 refills | Status: DC

## 2019-10-03 MED ORDER — CLOPIDOGREL BISULFATE 75 MG PO TABS
75.0000 mg | ORAL_TABLET | Freq: Every day | ORAL | 3 refills | Status: DC
Start: 2019-10-04 — End: 2020-12-13

## 2019-10-03 MED ORDER — HYDROCHLOROTHIAZIDE 12.5 MG PO CAPS: 12.5000 mg | ORAL_CAPSULE | Freq: Every day | ORAL | Status: DC

## 2019-10-03 MED ORDER — PERFLUTREN LIPID MICROSPHERE
1.0000 mL | INTRAVENOUS | Status: DC | PRN
  Administered 2019-10-03: 4 mL via INTRAVENOUS
  Filled 2019-10-03: qty 10

## 2019-10-03 MED ORDER — METOPROLOL SUCCINATE ER 25 MG PO TB24
12.5000 mg | ORAL_TABLET | Freq: Every day | ORAL | 3 refills | Status: DC
Start: 2019-10-04 — End: 2022-01-19

## 2019-10-03 MED ORDER — LOSARTAN POTASSIUM 50 MG PO TABS
100.0000 mg | ORAL_TABLET | Freq: Every day | ORAL | Status: DC
  Administered 2019-10-03: 100 mg via ORAL
  Filled 2019-10-03: qty 2

## 2019-10-03 MED FILL — Verapamil HCl IV Soln 2.5 MG/ML: INTRAVENOUS | Qty: 2 | Status: AC

## 2019-10-03 MED FILL — Nitroglycerin IV Soln 200 MCG/ML in D5W: INTRAVENOUS | Qty: 250 | Status: AC

## 2019-10-03 MED FILL — HYDROCHLOROTHIAZIDE 12.5 MG: 12.5 | 90 days supply | Qty: 90 | Fill #0

## 2019-10-03 MED FILL — CLOPIDOGREL 75 MG TABLET: 75 | 90 days supply | Qty: 90 | Fill #0

## 2019-10-03 MED FILL — METOPROLOL SUCCINATE ER 25: 25 | 90 days supply | Qty: 45 | Fill #0

## 2019-10-03 NOTE — Progress Notes (Signed)
CARDIAC REHAB PHASE I   PRE:  Rate/Rhythm: 51 SB    BP: sitting 162/66    SaO2: 95 RA  MODE:  Ambulation: 470 ft   POST:  Rate/Rhythm: 84 SR    BP: sitting 181/78     SaO2: 96 RA  Tolerated well. Sts less SOB, ambulated quick pace. BP elevated but HR low (49 at rest after walk). Discussed stent, restrictions, Plavix, diet, exercise, and CRPII. Will refer to [place]. Understands importance of Plavix. Did not discuss NTG as pt has not had and HOCM. [place], ACSM 10/03/2019 8:33 AM

## 2019-10-03 NOTE — Plan of Care (Signed)
Pt d/c to home, d/c education complete, printed and verbal instruction given, verbal understanding received. Taken to discharge lounge, family member enroute

## 2019-10-03 NOTE — Progress Notes (Signed)
Echocardiogram 2D Echocardiogram has been performed.  [place] Deputy Emrick Hensch 10/03/2019, 9:52 AM   Notified Dr. Oval Linsey of read request.

## 2019-10-03 NOTE — Progress Notes (Addendum)
Progress Note  Patient Name: [name] Date of Encounter: 10/03/2019  [facility] Cardiologist: Kate Sable, MD (Inactive) , now Branch  Subjective   No complaints. Pending D/C today - awaiting echo, monitor BP  Inpatient Medications    Scheduled Meds: . amLODipine  10 mg Oral Daily  . aspirin EC  81 mg Oral Daily  . clopidogrel  75 mg Oral Q breakfast  . enoxaparin (LOVENOX) injection  40 mg Subcutaneous Q24H  . losartan  100 mg Oral Daily  . metoprolol succinate  12.5 mg Oral Daily  . rosuvastatin  5 mg Oral Daily  . sodium chloride flush  3 mL Intravenous Q12H   Continuous Infusions: . sodium chloride     PRN Meds: sodium chloride, acetaminophen, ondansetron (ZOFRAN) IV, sodium chloride flush   Vital Signs    Vitals:   10/02/19 2041 10/02/19 2140 10/03/19 0045 10/03/19 0400  BP: (!) 141/69 (!) 171/64 (!) 147/64 (!) 160/68  Pulse: (!) 57 (!) 58 (!) 54 (!) 50  Resp: 18  18 18   Temp: 98.6 F (37 C)  98.3 F (36.8 C) 98.4 F (36.9 C)  TempSrc: Oral  Oral Oral  SpO2: 94%  93% 95%  Weight:    90 kg  Height:        Intake/Output Summary (Last 24 hours) at 10/03/2019 0749 Last data filed at 10/03/2019 0500 Gross per 24 hour  Intake 1220 ml  Output 1300 ml  Net -80 ml   Last 3 Weights 10/03/2019 10/02/2019 09/08/2019  Weight (lbs) 198 lb 6.6 oz 200 lb 220 lb 9.6 oz  Weight (kg) 90 kg 90.719 kg 100.064 kg      Telemetry    Sinus to sinus bradycardia in the 50-60s - Personally Reviewed  ECG    Sinus bradycardia HR 51, iRBBB, TWI AVL, V4-6 - Personally Reviewed  Physical Exam   GEN: No acute distress.   Neck: No JVD Cardiac: RRR, + systolic murmur Respiratory: Clear to auscultation bilaterally. GI: Soft, nontender, non-distended  MS: No edema; No deformity. Neuro:  Nonfocal  Psych: Normal affect  Right radial access site C/D/I  Labs    High Sensitivity Troponin:  No results for input(s): TROPONINIHS in the last 720 hours.     Chemistry Recent Labs  Lab 09/28/19 1140 10/03/19 0629  NA 140 140  K 3.8 4.0  CL 108 107  CO2 22 23  GLUCOSE 120* 95  BUN 13 10  CREATININE 0.86 0.87  CALCIUM 9.4 9.1  GFRNONAA >60 >60  GFRAA >60 >60  ANIONGAP 10 10     Hematology Recent Labs  Lab 09/28/19 1140 10/03/19 0629  WBC 6.9 9.1  RBC 4.31 4.40  HGB 14.1 14.0  HCT 41.5 41.3  MCV 96.3 93.9  MCH 32.7 31.8  MCHC 34.0 33.9  RDW 12.8 12.8  PLT 231 228    BNPNo results for input(s): BNP, PROBNP in the last 168 hours.   DDimer No results for input(s): DDIMER in the last 168 hours.   Radiology    CARDIAC CATHETERIZATION  Result Date: 10/02/2019 Conclusions: 1. Severe single vessel coronary artery disease with 80% proximal LAD stenosis at takeoff of large D1 branch. 2. Moderate, nonobstructive ostial RCA disease (DFR = 0.97). 3. Hyperdynamic left ventricular contraction with dynamic mid-cavity gradient (post-PVC gradient up to ~110 mmHg). 4. Mildly elevated left ventricular filling pressure (LVEDP 15-20 mmHg). 5. Successful PCI to proximal LAD using Resolute Onyx 3.0 x 15 mm DES  with 0% residual stenosis and TIMI-3 flow.  Recommendations: 1. Overnight extended recovery. 2. Dual antiplatelet therapy with aspirin and clopidogrel for at least 6 months. 3. Aggressive secondary prevention. 4. Hold isosorbide mononitrate and add metoprolol succinate 12.5 mg daily for BP and heart rate control in the setting of possible HOCM. 5. Consider repeat outpatient echo versus cardiac MRI for further evaluation of possible HOCM. Nelva Bush, MD [facility]    Cardiac Studies   Left heart cath 10/02/19: Conclusions: 1. Severe single vessel coronary artery disease with 80% proximal LAD stenosis at takeoff of large D1 branch. 2. Moderate, nonobstructive ostial RCA disease (DFR = 0.97). 3. Hyperdynamic left ventricular contraction with dynamic mid-cavity gradient (post-PVC gradient up to ~110 mmHg). 4. Mildly elevated left  ventricular filling pressure (LVEDP 15-20 mmHg). 5. Successful PCI to proximal LAD using Resolute Onyx 3.0 x 15 mm DES with 0% residual stenosis and TIMI-3 flow.  Recommendations: 1. Overnight extended recovery. 2. Dual antiplatelet therapy with aspirin and clopidogrel for at least 6 months. 3. Aggressive secondary prevention. 4. Hold isosorbide mononitrate and add metoprolol succinate 12.5 mg daily for BP and heart rate control in the setting of possible HOCM. 5. Consider repeat outpatient echo versus cardiac MRI for further evaluation of possible HOCM.   Echo 11/04/17: Study Conclusions   - Left ventricle: The cavity size was normal. Systolic function was  vigorous. The estimated ejection fraction was in the range of 65%  to 70%. Wall motion was normal; there were no regional wall  motion abnormalities. Features are consistent with a pseudonormal  left ventricular filling pattern, with concomitant abnormal  relaxation and increased filling pressure (grade 2 diastolic  dysfunction). Doppler parameters are consistent with high  ventricular filling pressure. Mild to moderate posterior wall and  severe proximal septal hypertrophy.  - Aortic valve: Mildly calcified annulus. Trileaflet. There was  mild regurgitation.  - Mitral valve: Mildly calcified annulus.  - Left atrium: The atrium was mildly dilated.  - Atrial septum: No defect or patent foramen ovale was identified.    Patient Profile     74 y.o. male with past medical history of CAD (s/p cath in 10/2017 which showed eccentric stenosis along the proximal LAD which appeared to have 30% stenosis except RAO cranial view and appeared to have 75% stenosis with discordant mildly positive DFR and negative FFR and medical management initially recommended given large diagonal branch), HNT, HLD, and OSA who presented for scheduled heart cath.  Assessment & Plan    CAD - heart cath yesterday with DES to 80% stenosis in  the mid LAD at the takeoff for D1 - residual 50% stenosis at the ostial RCA (DFR 0.97) - mildly elevated LVEDP 15-20 mmHg - patient tolerated the procedure well and was started on ASA and plavix for 6 months - holding imdur, BB started - repeat echo today pending to determine cardiomyopathy   Hypertension - continue 10 mg amlodipine, 100 mg losartan, and 12.5 mg toprol - pressure is not controlled - will await echo results to titrate medications, but will likely need additional agent since we are discontinuing imdur   Hyperlipidemia with LDL goal < 70 - LDL was 50 02/2019 - intolerant to lipitor - continue 5 mg crestor - labs by PCP, but will add on lipid panel this morning   OSA on CPAP - follow up with neurology RE mask issues - encouraged compliance    For questions or updates, please contact [facility] Please consult [email or web address] for  contact info under        Signed, Ledora Bottcher, PA  10/03/2019, 7:49 AM

## 2019-10-03 NOTE — Discharge Summary (Addendum)
The patient has been seen in conjunction with Fabian Sharp, PAC. All aspects of care have been considered and discussed. The patient has been personally interviewed, examined, and all clinical data has been reviewed.   The patient feels fine this morning.  He has had no chest discomfort overnight.  The right radial cath site is unremarkable.  He is ambulated with cardiac rehab and has had no difficulty.  Plan discharge today with follow-up with Dr. Harl Bowie.  Will need further up titration/adjustment in antihypertensive therapy.  At discharge we have added HCTZ 12.5 mg to losartan 100 mg/day, Toprol-XL 25 mg/day, and amlodipine.  Isosorbide mononitrate is been discontinued.  Continue aggressive risk factor modification with target LDL less than 70.  He is currently on low-dose rosuvastatin.  An echocardiogram prior to discharge demonstrated asymmetric septal hypertrophy (severe) with normal LV function and compatible with hypertrophic cardiomyopathy.  Diuretic has been added.  If the patient develops a systolic murmur or any clinical evidence of outflow obstruction, the medication regimen will need to be adjusted.  Following stent implantation, he will require 6 months of dual antiplatelet therapy as instructed in the post procedure note by Dr. Saunders Revel.   Discharge Summary    Patient ID: ALEN MATHESON MRN: [id number]; DOB: 06-21-1945  Admit date: 10/02/2019 Discharge date: 10/03/2019  Primary Care Provider: Sharilyn Sites, MD  Primary Cardiologist: Carlyle Dolly, MD , previously [facility] Primary Electrophysiologist:  None   Discharge Diagnoses    Principal Problem:   Coronary artery disease with stable angina pectoris [facility]) Active Problems:   Chest pain   HTN (hypertension)   Hyperlipidemia   Sinus bradycardia   Coronary artery disease of native artery of native heart with stable angina pectoris [facility])   Diagnostic Studies/Procedures    Left heart cath  10/02/19: Conclusions: 1. Severe single vessel coronary artery disease with 80% proximal LAD stenosis at takeoff of large D1 branch. 2. Moderate, nonobstructive ostial RCA disease (DFR = 0.97). 3. Hyperdynamic left ventricular contraction with dynamic mid-cavity gradient (post-PVC gradient up to ~110 mmHg). 4. Mildly elevated left ventricular filling pressure (LVEDP 15-20 mmHg). 5. Successful PCI to proximal LAD using Resolute Onyx 3.0 x 15 mm DES with 0% residual stenosis and TIMI-3 flow.  Recommendations: 1. Overnight extended recovery. 2. Dual antiplatelet therapy with aspirin and clopidogrel for at least 6 months. 3. Aggressive secondary prevention. 4. Hold isosorbide mononitrate and add metoprolol succinate 12.5 mg daily for BP and heart rate control in the setting of possible HOCM. 5. Consider repeat outpatient echo versus cardiac MRI for further evaluation of possible HOCM.  _____________   Echo 10/03/19: 1. Severe septal hypertrophy with otherwise mild concentric hypertrophy.  Left ventricular ejection fraction, by estimation, is 65 to 70%. The left  ventricle has normal function. The left ventricle has no regional wall  motion abnormalities. There is  severe asymmetric left ventricular hypertrophy of the basal-septal  segment. Left ventricular diastolic parameters are consistent with Grade  II diastolic dysfunction (pseudonormalization). Elevated left ventricular  end-diastolic pressure.  2. Right ventricular systolic function is normal. The right ventricular  size is normal. There is normal pulmonary artery systolic pressure.  3. Left atrial size was severely dilated.  4. The mitral valve is normal in structure. No evidence of mitral valve  regurgitation. No evidence of mitral stenosis.  5. The aortic valve is tricuspid. Aortic valve regurgitation is mild. No  aortic stenosis is present.  6. The inferior vena cava is normal in size with <50%  respiratory  variability,  suggesting right atrial pressure of 8 mmHg.   History of Present Illness     [name] is a 74 y.o. male with past medical history of CAD (s/p cath in 10/2017 which showed eccentric stenosis along the proximal LAD which appeared to have 30% stenosis except RAO cranial view and appeared to have 75% stenosis with discordant mildly positive DFR and negative FFR and medical management initially recommended given large diagonal branch), HNT, HLD, and OSA who presented for scheduled heart cath.  Clinic visit 09/08/19 with Bernerd Pho - patient compained of DOE that was worsening. He also reported weight gain and some chest tightness that resolved without nitro. He was sent for stress test that was read as high risk and was therefore referred to heart cath.  Hospital Course     Consultants: none  CAD Heart cath yesterday with DES to 80% stenosis in the mid LAD at the takeoff for D1. Residual 50% stenosis at the ostial RCA (DFR 0.97). Mildly elevated LVEDP 15-20 mmHg. Patient tolerated the procedure well and was started on ASA and plavix for 6 months. D/C'ed imdur, low dose BB started. Per Dr. Darnelle Bos recommendations, echocardiogram has been completed this morning to evaluate HOCM.    Hypertension Pressures were elevated without imdur. Added 12.5 mg HCTZ to 10 mg amlodipine and 100 mg losartan.     Chronic diastolic heart failure LVH, HCM Pt appears euvolemic on exam. Started HCTZ as above. Echo reviewed. 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 May continue amlodipine and losartan, caution with increasing diuretic.    Hyperlipidemia with LDL goal < 70 10/03/2019: Cholesterol 113; HDL 42; LDL Cholesterol 53; Triglycerides 92; VLDL 18 Continue 5 mg crestor. Pt has been intolerant to lipitor.    OSA on CPAP Follow up with neurology regarding issues with mask.  Encouraged compliance.    Pt seen and examined with Dr. Tamala Julian and deemed stable for  discharge.     Did the patient have an acute coronary syndrome (MI, NSTEMI, STEMI, etc) this admission?:  No                               Did the patient have a percutaneous coronary intervention (stent / angioplasty)?:  Yes.     Cath/PCI Registry Performance & Quality Measures: 8. Aspirin prescribed? - Yes 9. ADP Receptor Inhibitor (Plavix/Clopidogrel, Brilinta/Ticagrelor or Effient/Prasugrel) prescribed (includes medically managed patients)? - Yes 10. High Intensity Statin (Lipitor 40-80mg  or Crestor 20-40mg ) prescribed? - No - intolerance 11. For EF <40%, was ACEI/ARB prescribed? - Yes 12. For EF <40%, Aldosterone Antagonist (Spironolactone or Eplerenone) prescribed? - Not Applicable (EF >/= 81%) 13. Cardiac Rehab Phase II ordered? - Yes   _____________  Discharge Vitals Blood pressure (!) 163/70, pulse (!) 54, temperature 98.6 F (37 C), temperature source Oral, resp. rate 18, height 5\' 6"  (1.676 m), weight 90 kg, SpO2 95 %.  Filed Weights   10/02/19 0842 10/03/19 0400  Weight: 90.7 kg 90 kg    Labs & Radiologic Studies    CBC Recent Labs    10/03/19 0629  WBC 9.1  HGB 14.0  HCT 41.3  MCV 93.9  PLT 191   Basic Metabolic Panel Recent Labs    10/03/19 0629  NA 140  K 4.0  CL 107  CO2 23  GLUCOSE 95  BUN 10  CREATININE 0.87  CALCIUM 9.1   Liver Function  Tests No results for input(s): AST, ALT, ALKPHOS, BILITOT, PROT, ALBUMIN in the last 72 hours. No results for input(s): LIPASE, AMYLASE in the last 72 hours. High Sensitivity Troponin:   No results for input(s): TROPONINIHS in the last 720 hours.  BNP Invalid input(s): POCBNP D-Dimer No results for input(s): DDIMER in the last 72 hours. Hemoglobin A1C No results for input(s): HGBA1C in the last 72 hours. Fasting Lipid Panel Recent Labs    10/03/19 0629  CHOL 113  HDL 42  LDLCALC 53  TRIG 92  CHOLHDL 2.7   Thyroid Function Tests No results for input(s): TSH, T4TOTAL, T3FREE, THYROIDAB in the  last 72 hours.  Invalid input(s): FREET3 _____________  CARDIAC CATHETERIZATION  Result Date: 10/02/2019 Conclusions: 1. Severe single vessel coronary artery disease with 80% proximal LAD stenosis at takeoff of large D1 branch. 2. Moderate, nonobstructive ostial RCA disease (DFR = 0.97). 3. Hyperdynamic left ventricular contraction with dynamic mid-cavity gradient (post-PVC gradient up to ~110 mmHg). 4. Mildly elevated left ventricular filling pressure (LVEDP 15-20 mmHg). 5. Successful PCI to proximal LAD using Resolute Onyx 3.0 x 15 mm DES with 0% residual stenosis and TIMI-3 flow.  Recommendations: 1. Overnight extended recovery. 2. Dual antiplatelet therapy with aspirin and clopidogrel for at least 6 months. 3. Aggressive secondary prevention. 4. Hold isosorbide mononitrate and add metoprolol succinate 12.5 mg daily for BP and heart rate control in the setting of possible HOCM. 5. Consider repeat outpatient echo versus cardiac MRI for further evaluation of possible HOCM. Nelva Bush, MD [facility]   NM Myocar Multi W/Spect Tamela Oddi Motion / EF  Result Date: 09/25/2019  Small, mild intensity, apical septal defect that is reversible and suggestive of ischemia. Increased TID ratio of 1.29 raises possibility of more diffuse balanced subendocardial ischemia.  Diagnostic ST segment depression was noted in the inferolateral leads with ST segment elevation in aVR raising the possibility of diffuse subendocardial ischemia, possibly multivessel distribution and therefore high risk findings.  Blood pressure demonstrated a hypertensive response to exercise.  This is a high risk study.  Nuclear stress EF: 52%.    ECHOCARDIOGRAM COMPLETE  Result Date: 10/03/2019    ECHOCARDIOGRAM REPORT   Patient Name:   JOHNPAUL GILLENTINE Date of Exam: 10/03/2019 Medical Rec #:  [id number]          Height:       66.0 in Accession #:    [id number]         Weight:       198.4 lb Date of Birth:  Apr 09, 1945          BSA:           1.993 m Patient Age:    17 years           BP:           181/78 mmHg Patient Gender: M                  HR:           50 bpm. Exam Location:  Inpatient Procedure: 2D Echo, Color Doppler, Cardiac Doppler and Intracardiac            Opacification Agent Indications:    I42.9 Cardiomyopathy (Hypertrophic)  History:        Patient has prior history of Echocardiogram examinations, most                 recent 11/04/2017. CAD; Risk Factors:Hypertension.  Sonographer:    Raquel Sarna Senior  RDCS Referring Phys: [id number] [place]  Sonographer Comments: Technically difficult due to poor echo windows. IMPRESSIONS  1. Severe septal hypertrophy with otherwise mild concentric hypertrophy. Left ventricular ejection fraction, by estimation, is 65 to 70%. The left ventricle has normal function. The left ventricle has no regional wall motion abnormalities. There is severe asymmetric left ventricular hypertrophy of the basal-septal segment. Left ventricular diastolic parameters are consistent with Grade II diastolic dysfunction (pseudonormalization). Elevated left ventricular end-diastolic pressure.  2. Right ventricular systolic function is normal. The right ventricular size is normal. There is normal pulmonary artery systolic pressure.  3. Left atrial size was severely dilated.  4. The mitral valve is normal in structure. No evidence of mitral valve regurgitation. No evidence of mitral stenosis.  5. The aortic valve is tricuspid. Aortic valve regurgitation is mild. No aortic stenosis is present.  6. The inferior vena cava is normal in size with <50% respiratory variability, suggesting right atrial pressure of 8 mmHg. FINDINGS  Left Ventricle: Severe septal hypertrophy with otherwise mild concentric hypertrophy. Left ventricular ejection fraction, by estimation, is 65 to 70%. The left ventricle has normal function. The left ventricle has no regional wall motion abnormalities. Definity contrast agent was given IV to delineate  the left ventricular endocardial borders. The left ventricular internal cavity size was normal in size. There is severe asymmetric left ventricular hypertrophy of the basal-septal segment. Left ventricular diastolic parameters are consistent with Grade II diastolic dysfunction (pseudonormalization). Elevated left ventricular end-diastolic pressure. Right Ventricle: The right ventricular size is normal. No increase in right ventricular wall thickness. Right ventricular systolic function is normal. There is normal pulmonary artery systolic pressure. The tricuspid regurgitant velocity is 1.94 m/s, and  with an assumed right atrial pressure of 8 mmHg, the estimated right ventricular systolic pressure is 45.4 mmHg. Left Atrium: Left atrial size was severely dilated. Right Atrium: Right atrial size was normal in size. Pericardium: There is no evidence of pericardial effusion. Mitral Valve: The mitral valve is normal in structure. Normal mobility of the mitral valve leaflets. No evidence of mitral valve regurgitation. No evidence of mitral valve stenosis. Tricuspid Valve: The tricuspid valve is normal in structure. Tricuspid valve regurgitation is trivial. No evidence of tricuspid stenosis. Aortic Valve: The aortic valve is tricuspid. . There is mild thickening and mild calcification of the aortic valve. Aortic valve regurgitation is mild. No aortic stenosis is present. There is mild thickening of the aortic valve. There is mild calcification of the aortic valve. Pulmonic Valve: The pulmonic valve was normal in structure. Pulmonic valve regurgitation is not visualized. No evidence of pulmonic stenosis. Aorta: The aortic root is normal in size and structure. Venous: The inferior vena cava is normal in size with less than 50% respiratory variability, suggesting right atrial pressure of 8 mmHg. IAS/Shunts: No atrial level shunt detected by color flow Doppler.  LEFT VENTRICLE PLAX 2D LVIDd:         4.40 cm  Diastology LVIDs:          2.10 cm  LV e' lateral:   7.40 cm/s LV PW:         1.10 cm  LV E/e' lateral: 13.4 LV IVS:        1.70 cm  LV e' medial:    5.87 cm/s LVOT diam:     1.90 cm  LV E/e' medial:  16.9 LV SV:         95 LV SV Index:   48 LVOT Area:  2.84 cm  RIGHT VENTRICLE RV S prime:     11.60 cm/s TAPSE (M-mode): 2.2 cm LEFT ATRIUM              Index       RIGHT ATRIUM           Index LA diam:        4.70 cm  2.36 cm/m  RA Area:     19.30 cm LA Vol (A2C):   70.6 ml  35.42 ml/m RA Volume:   53.40 ml  26.79 ml/m LA Vol (A4C):   102.0 ml 51.18 ml/m LA Biplane Vol: 85.6 ml  42.95 ml/m  AORTIC VALVE LVOT Vmax:   133.00 cm/s LVOT Vmean:  97.900 cm/s LVOT VTI:    0.334 m  AORTA Ao Root diam: 2.90 cm Ao Asc diam:  3.10 cm MITRAL VALVE               TRICUSPID VALVE MV Area (PHT): 2.99 cm    TR Peak grad:   15.1 mmHg MV Decel Time: 254 msec    TR Vmax:        194.00 cm/s MV E velocity: 99.20 cm/s MV A velocity: 82.60 cm/s  SHUNTS MV E/A ratio:  1.20        Systemic VTI:  0.33 m                            Systemic Diam: 1.90 cm Skeet Latch MD Electronically signed by Skeet Latch MD Signature Date/Time: 10/03/2019/10:55:26 AM    Final    Disposition   Pt is being discharged home today in good condition.  Follow-up Plans & Appointments     Follow-up Information    Verta Ellen., NP Follow up on 10/17/2019.   Specialty: Cardiology Why: 11:30 am for [facility] Contact information: [place] [address] [phone]              Discharge Instructions    Amb Referral to Cardiac Rehabilitation   Complete by: As directed    Diagnosis:  Coronary Stents PTCA     After initial evaluation and assessments completed: Virtual Based Care may be provided alone or in conjunction with Phase 2 Cardiac Rehab based on patient barriers.: Yes   Diet - low sodium heart healthy   Complete by: As directed    Discharge instructions   Complete by: As directed    No driving for 2 days. No  lifting over 5 lbs for 1 week. No sexual activity for 1 week. You may return to work in 1 week. Keep procedure site clean & dry. If you notice increased pain, swelling, bleeding or pus, call/return!  You may shower, but no soaking baths/hot tubs/pools for 1 week.   Increase activity slowly   Complete by: As directed       Discharge Medications   Allergies as of 10/03/2019      Reactions   Penicillins    Eye swelled shut .Has patient had a PCN reaction causing immediate rash, facial/tongue/throat swelling, SOB or lightheadedness with hypotension: Yes Has patient had a PCN reaction causing severe rash involving mucus membranes or skin necrosis: No Has patient had a PCN reaction that required hospitalization: No Has patient had a PCN reaction occurring within the last 10 years: No If all of the above answers are "NO", then may proceed with Cephalosporin use  Medication List    STOP taking these medications   ibuprofen 200 MG tablet Commonly known as: ADVIL   isosorbide mononitrate 60 MG 24 hr tablet Commonly known as: IMDUR     TAKE these medications   amLODipine 10 MG tablet Commonly known as: NORVASC Take 1 tablet by mouth once daily   aspirin 81 MG tablet Take 81 mg by mouth daily.   clopidogrel 75 MG tablet Commonly known as: PLAVIX Take 1 tablet (75 mg total) by mouth daily with breakfast. Start taking on: October 04, 2019   hydrochlorothiazide 12.5 MG capsule Commonly known as: MICROZIDE Take 1 capsule (12.5 mg total) by mouth daily.   losartan 100 MG tablet Commonly known as: COZAAR Take 1 tablet (100 mg total) by mouth daily.   metoprolol succinate 25 MG 24 hr tablet Commonly known as: TOPROL-XL Take 0.5 tablets (12.5 mg total) by mouth daily. Start taking on: October 04, 2019   rosuvastatin 5 MG tablet Commonly known as: CRESTOR Take 1 tablet (5 mg total) by mouth daily.          Outstanding Labs/Studies   Follow up echo  Duration of Discharge  Encounter   Greater than 30 minutes including physician time.  Signed, Tami Lin Duke, PA 10/03/2019, 10:57 AM

## 2019-10-09 ENCOUNTER — Ambulatory Visit (INDEPENDENT_AMBULATORY_CARE_PROVIDER_SITE_OTHER): Payer: Medicare Other | Admitting: Adult Health

## 2019-10-09 DIAGNOSIS — G4733 Obstructive sleep apnea (adult) (pediatric): Secondary | ICD-10-CM

## 2019-10-09 DIAGNOSIS — Z9989 Dependence on other enabling machines and devices: Secondary | ICD-10-CM

## 2019-10-09 NOTE — Progress Notes (Signed)
  [facility] [address] [place]. [place] [address] [phone]     Virtual Visit via Telephone Note  I connected with [name] on 10/09/19 at  3:30 PM EDT by telephone located remotely at [facility] and verified that I am speaking with the correct person using two identifiers who reports being located at home   Visit scheduled byRN. She discussed the limitations, risks, security and privacy concerns of performing an evaluation and management service by telephone and the availability of in person appointments. I also discussed with the patient that there may be a patient responsible charge related to this service. The patient expressed understanding and agreed to proceed. See telephone note for consent and additional scheduling information.    History of Present Illness:  [name] is a 74 y.o. male who has been followed in this office for obstructive sleep apnea on CPAP.  He was initially scheduled for face-to-face office follow up visit today time but due to [place], visit rescheduled for non-face-to-face telephone visit with patients consent. Unable to participate in video visit due to lack of access to device with camera.    [name] is a 74 year old male with a history of obstructive sleep apnea on CPAP.  His download indicates that he use his machine 29 out of 30 days for compliance of 97%.  He uses machine greater than 4 hours 27 days for compliance of 90%.  On average he uses his machine 7 hours and 55 minutes.  His residual AHI is 4.4 on 6 to 18 cm of water with EPR 2.  Leak in the 95th percentile is 24.8 he reports that there are some nights the pressure feels very strong.  His average pressure in the 95th percentile is 11.4.  He also states that he does not like the heated tubing.     Observations/Objective:  Generalized: Well developed, in no acute distress   Neurological examination  Mentation: Alert oriented to  time, place, history taking. Follows all commands speech and language fluent  Assessment and Plan:  1: Obstructive sleep apnea on CPAP   Good compliance  Good treatment of apnea  Will decrease his pressure 6 to 15 cm of water to see if this is  more comfortable  We will send an order to his DME company to turn off heated  tubing  Follow-up in 6 months or sooner if needed   Follow Up Instructions:   F/U in 6 months    I discussed the assessment and treatment plan with the patient.  The patient was provided an opportunity to ask questions and all were answered to their satisfaction. The patient agreed with the plan and verbalized an understanding of the instructions.   I provided 15 minutes of non-face-to-face time during this encounter.    Ward Givens NP-C  [facility] [address] [place],  [address]  Phone [phone] Fax [phone] \

## 2019-10-09 NOTE — Addendum Note (Signed)
Addended by: Trudie Buckler on: 10/09/2019 01:15 PM   Modules accepted: Orders

## 2019-10-10 NOTE — Progress Notes (Addendum)
CM sent to [facility] for new cpap orders. RE: cpap orders Received: Today Towanda Octave, RN Thank you! We will begin processing.

## 2019-10-16 NOTE — Progress Notes (Deleted)
Cardiology Office Note  Date: 10/16/2019   ID: [name], DOB [birth year], MRN [id number]  PCP:  Sharilyn Sites, MD  Cardiologist:  Carlyle Dolly, MD Electrophysiologist:  None   Chief Complaint: CAD  History of Present Illness: [name] is a 74 y.o. male with a history of CAD, HLD, HTN, OSA on CPAP, dyspnea.  Last visit with [name], [name] 09/08/2019.  He reporting having episodes of dyspnea with exertion for the past few years but felt like it was worsening over the recent 6 months.  Described it is most notable when walking to mailbox.  Describing some associated chest tightness.  Symptoms resolved quickly with use of sublingual nitroglycerin.  He was having difficulty with his CPAP describing it is making him feel like he was suffocating at times.  A nuclear treadmill Myoview was ordered.  Imdur was increased to 60 mg daily.  His blood pressure was elevated.  He was continuing amlodipine 10 mg daily, losartan 100 mg daily.  He was continuing Crestor 5 mg.  Stress test was abnormal and patient underwent cardiac cath 10/02/2019 80% proximal LAD at the takeoff of large D1.  DES placed.  Moderate nonobstructive ostial RCA disease.  DAPT therapy for least 6 months.  Isosorbide was stopped and metoprolol added at 12.5 mg p.o. daily for blood pressure and heart rate control in setting of possible HOCM. Echocardiogram;  severe septal hypertrophy otherwise mild concentric hypertrophy.  EF 65 to 70%.  Severe asymmetric LVH of basal/septal segment.  G2 DD.  Severe LA dilatation, mild AR  Past Medical History:  Diagnosis Date  . CAD (coronary artery disease)    a. s/p cath in 10/2017 which showed eccentric stenosis along the proximal LAD which appeared to have 30% stenosis except RAO cranial view and appeared to have 75% stenosis with discordant mildly positive DFR and negative FFR and medical management initially recommended given large diagonal branch  . Cancer ([place])  07/2012   prostate  . Heart disease   . Hypertension     Past Surgical History:  Procedure Laterality Date  . COLONOSCOPY N/A 08/28/2014   Procedure: COLONOSCOPY;  Surgeon: Aviva Signs, MD;  Location: [facility];  Service: Gastroenterology;  Laterality: N/A;  . CORONARY STENT INTERVENTION N/A 10/02/2019   Procedure: CORONARY STENT INTERVENTION;  Surgeon: Nelva Bush, MD;  Location: [place] [facility];  Service: Cardiovascular;  Laterality: N/A;  . INTRAVASCULAR PRESSURE WIRE/FFR STUDY N/A 11/04/2017   Procedure: INTRAVASCULAR PRESSURE WIRE/DFR vs FFR  STUDY;  Surgeon: Troy Sine, MD;  Location: [place] [facility];  Service: Cardiovascular;  Laterality: N/A;  . INTRAVASCULAR PRESSURE WIRE/FFR STUDY N/A 10/02/2019   Procedure: INTRAVASCULAR PRESSURE WIRE/FFR STUDY;  Surgeon: Nelva Bush, MD;  Location: [place] [facility];  Service: Cardiovascular;  Laterality: N/A;  . LEFT HEART CATH AND CORONARY ANGIOGRAPHY N/A 11/04/2017   Procedure: LEFT HEART CATH AND CORONARY ANGIOGRAPHY;  Surgeon: Troy Sine, MD;  Location: [place] [facility];  Service: Cardiovascular;  Laterality: N/A;  . LEFT HEART CATH AND CORONARY ANGIOGRAPHY N/A 10/02/2019   Procedure: LEFT HEART CATH AND CORONARY ANGIOGRAPHY;  Surgeon: Nelva Bush, MD;  Location: [place] [facility];  Service: Cardiovascular;  Laterality: N/A;  . PROSTATE SURGERY  12/2012  . SHOULDER SURGERY Bilateral     Current Outpatient Medications  Medication Sig Dispense Refill  . amLODipine (NORVASC) 10 MG tablet Take 1 tablet by mouth once daily (Patient taking differently: Take 10 mg by  mouth daily. ) 90 tablet 3  . aspirin 81 MG tablet Take 81 mg by mouth daily.    . clopidogrel (PLAVIX) 75 MG tablet Take 1 tablet (75 mg total) by mouth daily with breakfast. 90 tablet 3  . hydrochlorothiazide (MICROZIDE) 12.5 MG capsule Take 1 capsule (12.5 mg total) by mouth daily. 90 capsule 3  . losartan (COZAAR) 100 MG tablet Take 1 tablet  (100 mg total) by mouth daily. 90 tablet 1  . metoprolol succinate (TOPROL-XL) 25 MG 24 hr tablet Take 0.5 tablets (12.5 mg total) by mouth daily. 90 tablet 3  . rosuvastatin (CRESTOR) 5 MG tablet Take 1 tablet (5 mg total) by mouth daily. 90 tablet 3   No current facility-administered medications for this visit.   Allergies:  Penicillins   Social History: The patient  reports that he quit smoking about 43 years ago. His smoking use included cigarettes. He has a 4.50 pack-year smoking history. He quit smokeless tobacco use about 40 years ago. He reports that he does not drink alcohol and does not use drugs.   Family History: The patient's family history includes Heart attack in his father; Kidney disease in his brother; Stroke in his father and mother.   ROS:  Please see the history of present illness. Otherwise, complete review of systems is positive for {NONE DEFAULTED:18576::"none"}.  All other systems are reviewed and negative.   Physical Exam: VS:  There were no vitals taken for this visit., BMI There is no height or weight on file to calculate BMI.  Wt Readings from Last 3 Encounters:  10/03/19 198 lb 6.6 oz (90 kg)  09/08/19 (!) 220 lb 9.6 oz (100.1 kg)  06/15/19 218 lb 9.6 oz (99.2 kg)    General: Patient appears comfortable at rest. HEENT: Conjunctiva and lids normal, oropharynx clear with moist mucosa. Neck: Supple, no elevated JVP or carotid bruits, no thyromegaly. Lungs: Clear to auscultation, nonlabored breathing at rest. Cardiac: Regular rate and rhythm, no S3 or significant systolic murmur, no pericardial rub. Abdomen: Soft, nontender, no hepatomegaly, bowel sounds present, no guarding or rebound. Extremities: No pitting edema, distal pulses 2+. Skin: Warm and dry. Musculoskeletal: No kyphosis. Neuropsychiatric: Alert and oriented x3, affect grossly appropriate.  ECG:  {EKG/Telemetry Strips [phone]}  Recent Labwork: 10/03/2019: BUN 10; Creatinine, Ser  0.87; Hemoglobin 14.0; Platelets 228; Potassium 4.0; Sodium 140     Component Value Date/Time   CHOL 113 10/03/2019 0629   TRIG 92 10/03/2019 0629   HDL 42 10/03/2019 0629   CHOLHDL 2.7 10/03/2019 0629   VLDL 18 10/03/2019 0629   LDLCALC 53 10/03/2019 0629    Other Studies Reviewed Today:   Echo 10/03/2019  1. Severe septal hypertrophy with otherwise mild concentric hypertrophy. Left ventricular ejection fraction, by estimation, is 65 to 70%. The left ventricle has normal function. The left ventricle has no regional wall motion abnormalities. There is severe asymmetric left ventricular hypertrophy of the basal-septal segment. Left ventricular diastolic parameters are consistent with Grade II diastolic dysfunction (pseudonormalization). Elevated left ventricular end-diastolic pressure. 2. Right ventricular systolic function is normal. The right ventricular size is normal. There is normal pulmonary artery systolic pressure. 3. Left atrial size was severely dilated. 4. The mitral valve is normal in structure. No evidence of mitral valve regurgitation. No evidence of mitral stenosis. 5. The aortic valve is tricuspid. Aortic valve regurgitation is mild. No aortic stenosis is present. 6. The inferior vena cava is normal in size with <50% respiratory variability, suggesting right  atrial pressure of 8 mmHg.   Cardiac catheterization 10/02/2019  Conclusions: 1. Severe single vessel coronary artery disease with 80% proximal LAD stenosis at takeoff of large D1 branch. 2. Moderate, nonobstructive ostial RCA disease (DFR = 0.97). 3. Hyperdynamic left ventricular contraction with dynamic mid-cavity gradient (post-PVC gradient up to ~110 mmHg). 4. Mildly elevated left ventricular filling pressure (LVEDP 15-20 mmHg). 5. Successful PCI to proximal LAD using Resolute Onyx 3.0 x 15 mm DES with 0% residual stenosis and TIMI-3 flow.  Recommendations: 1. Overnight extended recovery. 2. Dual  antiplatelet therapy with aspirin and clopidogrel for at least 6 months. 3. Aggressive secondary prevention. 4. Hold isosorbide mononitrate and add metoprolol succinate 12.5 mg daily for BP and heart rate control in the setting of possible HOCM. 5. Consider repeat outpatient echo versus cardiac MRI for further evaluation of possible HOCM.  Diagnostic Dominance: Right  Intervention      NST 09/25/2019  Narrative & Impression   Small, mild intensity, apical septal defect that is reversible and suggestive of ischemia. Increased TID ratio of 1.29 raises possibility of more diffuse balanced subendocardial ischemia.  Diagnostic ST segment depression was noted in the inferolateral leads with ST segment elevation in aVR raising the possibility of diffuse subendocardial ischemia, possibly multivessel distribution and therefore high risk findings.  Blood pressure demonstrated a hypertensive response to exercise.  This is a high risk study.  Nuclear stress EF: 52%.     Echocardiogram: 10/2017 Study Conclusions   - Left ventricle: The cavity size was normal. Systolic function was  vigorous. The estimated ejection fraction was in the range of 65%  to 70%. Wall motion was normal; there were no regional wall  motion abnormalities. Features are consistent with a pseudonormal  left ventricular filling pattern, with concomitant abnormal  relaxation and increased filling pressure (grade 2 diastolic  dysfunction). Doppler parameters are consistent with high  ventricular filling pressure. Mild to moderate posterior wall and  severe proximal septal hypertrophy.  - Aortic valve: Mildly calcified annulus. Trileaflet. There was  mild regurgitation.  - Mitral valve: Mildly calcified annulus.  - Left atrium: The atrium was mildly dilated.  - Atrial septum: No defect or patent foramen ovale was identified.    Cardiac Catheterization: 10/2017  Ost RCA to Prox RCA lesion is  50% stenosed.  Prox RCA lesion is 20% stenosed.  Post Atrio lesion is 20% stenosed.  Prox LAD lesion is 75% stenosed.  Prox Cx to Mid Cx lesion is 10% stenosed.  1st Mrg lesion is 20% stenosed.  The left ventricular systolic function is normal.  LV end diastolic pressure is normal.  The left ventricular ejection fraction is 55-65% by visual estimate.  The proximal LAD has an eccentric stenosis in the region of a large 4 branch diagonal vessel which in most views does not appear significantly narrowed but in the RAO cranial view appears at least 75%. FFR and DFR were discordant with mild positivity on DFR and negative FFR.   Mild nonobstructive 20% circumflex stenoses.  Probable proximal RCA spasm with narrowing up to 50% and 20% proximal and distal RCA stenoses.  Normal LV function with an ejection fraction of 65% without focal segmental wall motion abnormalities.  RECOMMENDATION: Since the patient was not on any prior anti-ischemic medications into his hospitalization, in light of the discordant flow data and large for branch diagonal vessel, the initial plan will be to attempt aggressive medical therapy with high potency statin therapy, addition of amlodipine, nitrates and beta-blocker. Will  discontinue HCTZ. Consider PCI if patient continues to experience recurrent symptomatology on a good medical regimen.  Recommend Aspirin 81mg  daily for moderate CAD.   Assessment and Plan:  1. CAD in native artery   2. Chest pain, unspecified type   3. Essential hypertension   4. Hyperlipidemia LDL goal <70   5. OSA (obstructive sleep apnea)    1. CAD in native artery ***  2. Chest pain, unspecified type ***  3. Essential hypertension ***  4. Hyperlipidemia LDL goal <70 ***  5. OSA (obstructive sleep apnea) ***  Medication Adjustments/Labs and Tests Ordered: Current medicines are reviewed at length with the patient today.  Concerns regarding medicines are  outlined above.   Disposition: Follow-up with ***  Signed, Levell July, NP 10/16/2019 10:18 PM    [place] at [facility] [address], [place], [place] [address] Phone: [phone]; Fax: [phone]

## 2019-10-17 ENCOUNTER — Ambulatory Visit: Payer: Medicare Other | Admitting: Family Medicine

## 2019-10-23 ENCOUNTER — Encounter: Payer: Self-pay | Admitting: Family Medicine

## 2019-10-23 ENCOUNTER — Ambulatory Visit: Payer: Medicare Other | Admitting: Family Medicine

## 2019-10-23 ENCOUNTER — Other Ambulatory Visit: Payer: Self-pay

## 2019-10-23 VITALS — BP 122/64 | HR 57 | Ht 66.0 in | Wt 196.0 lb

## 2019-10-23 DIAGNOSIS — E782 Mixed hyperlipidemia: Secondary | ICD-10-CM

## 2019-10-23 DIAGNOSIS — G4733 Obstructive sleep apnea (adult) (pediatric): Secondary | ICD-10-CM

## 2019-10-23 DIAGNOSIS — R079 Chest pain, unspecified: Secondary | ICD-10-CM | POA: Diagnosis not present

## 2019-10-23 DIAGNOSIS — I2510 Atherosclerotic heart disease of native coronary artery without angina pectoris: Secondary | ICD-10-CM

## 2019-10-23 DIAGNOSIS — Z9989 Dependence on other enabling machines and devices: Secondary | ICD-10-CM

## 2019-10-23 DIAGNOSIS — I10 Essential (primary) hypertension: Secondary | ICD-10-CM

## 2019-10-23 NOTE — Patient Instructions (Signed)
Medication Instructions:  Continue all current medications.  Labwork: none  Testing/Procedures: none  Follow-Up: As planned   Any Other Special Instructions Will Be Listed Below (If Applicable).  If you need a refill on your cardiac medications before your next appointment, please call your pharmacy.

## 2019-10-23 NOTE — Progress Notes (Signed)
Cardiology Office Note  Date: 10/23/2019   ID: [name], DOB 14-Feb-1945, MRN [id number]  PCP:  James Sites, MD  Cardiologist:  James Dolly, MD Electrophysiologist:  None   Chief Complaint: CAD/status post cardiac catheterization with PCI to LAD 10/02/2019  History of Present Illness: [name] is a 74 y.o. male with a history of CAD, HLD, HTN, OSA on CPAP, dyspnea.  Last visit with [name], [place] 09/08/2019.  He reporting having episodes of dyspnea with exertion for the past few years but felt like it was worsening over the recent 6 months.  Described it is most notable when walking to mailbox.  Describing some associated chest tightness.  Symptoms resolved quickly with use of sublingual nitroglycerin.  He was having difficulty with his CPAP describing it is making him feel like he was suffocating at times.  A nuclear treadmill Myoview was ordered.  Imdur was increased to 60 mg daily.  His blood pressure was elevated.  He was continuing amlodipine 10 mg daily, losartan 100 mg daily.  He was continuing Crestor 5 mg.  Stress test was abnormal and patient underwent cardiac cath 10/02/2019 80% proximal LAD at the takeoff of large D1.  DES placed.  Moderate nonobstructive ostial RCA disease.  DAPT therapy for least 6 months.  Isosorbide was stopped and metoprolol added at 12.5 mg p.o. daily for blood pressure and heart rate control in setting of possible HOCM. Echocardiogram;  severe septal hypertrophy otherwise mild concentric hypertrophy.  EF 65 to 70%.  Severe asymmetric LVH of basal/septal segment.  G2 DD.  Severe LA dilatation, mild AR   Patient presents today status post cardiac catheterization and PCI to LAD.  Denies any anginal or exertional symptoms, palpitations or arrhythmias, orthostatic symptoms, stroke or TIA-like symptoms, PND, orthopnea, bleeding issues, denies any claudication-like symptoms, DVT or PE-like symptoms, or lower extremity edema.  States he  has been compliant with his CPAP therapy.  Blood pressure is well controlled.  He states he has not had the Covid vaccine.  His right radial access is clean and dry with 2+ pulses. Recent echocardiogram 10/03/2019 severe septal hypertrophy with otherwise mild concentric hypertrophy.  EF 65-70.  (Severe asymmetric LVH of basal-septal segment.  Grade 2 DD.  Severe LA dilation, mild AR).    Past Medical History:  Diagnosis Date  . CAD (coronary artery disease)    a. s/p cath in 10/2017 which showed eccentric stenosis along the proximal LAD which appeared to have 30% stenosis except RAO cranial view and appeared to have 75% stenosis with discordant mildly positive DFR and negative FFR and medical management initially recommended given large diagonal branch  . Cancer ([place]) 07/2012   prostate  . Heart disease   . Hypertension     Past Surgical History:  Procedure Laterality Date  . COLONOSCOPY N/A 08/28/2014   Procedure: COLONOSCOPY;  Surgeon: Aviva Signs, MD;  Location: [facility];  Service: Gastroenterology;  Laterality: N/A;  . CORONARY STENT INTERVENTION N/A 10/02/2019   Procedure: CORONARY STENT INTERVENTION;  Surgeon: Nelva Bush, MD;  Location: [place] [facility];  Service: Cardiovascular;  Laterality: N/A;  . INTRAVASCULAR PRESSURE WIRE/FFR STUDY N/A 11/04/2017   Procedure: INTRAVASCULAR PRESSURE WIRE/DFR vs FFR  STUDY;  Surgeon: Troy Sine, MD;  Location: [place] [facility];  Service: Cardiovascular;  Laterality: N/A;  . INTRAVASCULAR PRESSURE WIRE/FFR STUDY N/A 10/02/2019   Procedure: INTRAVASCULAR PRESSURE WIRE/FFR STUDY;  Surgeon: Nelva Bush, MD;  Location: [place] [facility];  Service: Cardiovascular;  Laterality: N/A;  . LEFT HEART CATH AND CORONARY ANGIOGRAPHY N/A 11/04/2017   Procedure: LEFT HEART CATH AND CORONARY ANGIOGRAPHY;  Surgeon: Troy Sine, MD;  Location: [place] [facility];  Service: Cardiovascular;  Laterality: N/A;  . LEFT HEART CATH AND CORONARY  ANGIOGRAPHY N/A 10/02/2019   Procedure: LEFT HEART CATH AND CORONARY ANGIOGRAPHY;  Surgeon: Nelva Bush, MD;  Location: [place] [facility];  Service: Cardiovascular;  Laterality: N/A;  . PROSTATE SURGERY  12/2012  . SHOULDER SURGERY Bilateral     Current Outpatient Medications  Medication Sig Dispense Refill  . amLODipine (NORVASC) 10 MG tablet Take 1 tablet by mouth once daily (Patient taking differently: Take 10 mg by mouth daily. ) 90 tablet 3  . aspirin 81 MG tablet Take 81 mg by mouth daily.    . clopidogrel (PLAVIX) 75 MG tablet Take 1 tablet (75 mg total) by mouth daily with breakfast. 90 tablet 3  . hydrochlorothiazide (MICROZIDE) 12.5 MG capsule Take 1 capsule (12.5 mg total) by mouth daily. 90 capsule 3  . losartan (COZAAR) 100 MG tablet Take 1 tablet (100 mg total) by mouth daily. 90 tablet 1  . metoprolol succinate (TOPROL-XL) 25 MG 24 hr tablet Take 0.5 tablets (12.5 mg total) by mouth daily. 90 tablet 3  . rosuvastatin (CRESTOR) 5 MG tablet Take 1 tablet (5 mg total) by mouth daily. 90 tablet 3   No current facility-administered medications for this visit.   Allergies:  Penicillins   Social History: The patient  reports that he quit smoking about 43 years ago. His smoking use included cigarettes. He has a 4.50 pack-year smoking history. He quit smokeless tobacco use about 40 years ago. He reports that he does not drink alcohol and does not use drugs.   Family History: The patient's family history includes Heart attack in his father; Kidney disease in his brother; Stroke in his father and mother.   ROS:  Please see the history of present illness. Otherwise, complete review of systems is positive for none.  All other systems are reviewed and negative.   Physical Exam: VS:  BP 122/64   Pulse (!) 57   Ht 5\' 6"  (1.676 m)   Wt 196 lb (88.9 kg)   SpO2 93%   BMI 31.64 kg/m , BMI Body mass index is 31.64 kg/m.  Wt Readings from Last 3 Encounters:  10/23/19 196 lb (88.9  kg)  10/03/19 198 lb 6.6 oz (90 kg)  09/08/19 (!) 220 lb 9.6 oz (100.1 kg)    General: Patient appears comfortable at rest. Neck: Supple, no elevated JVP or carotid bruits, no thyromegaly. Lungs: Clear to auscultation, nonlabored breathing at rest. Cardiac: Regular rate and rhythm, no S3 or significant systolic murmur, no pericardial rub. Extremities: No pitting edema, distal pulses 2+. Skin: Warm and dry. Musculoskeletal: No kyphosis. Neuropsychiatric: Alert and oriented x3, affect grossly appropriate.  ECG:  EKG 10/02/2019: Normal sinus rhythm, incomplete RBBB, inferior infarct, age undetermined, T wave abnormality, consider lateral ischemia, heart rate of 61.  Recent Labwork: 10/03/2019: BUN 10; Creatinine, Ser 0.87; Hemoglobin 14.0; Platelets 228; Potassium 4.0; Sodium 140     Component Value Date/Time   CHOL 113 10/03/2019 0629   TRIG 92 10/03/2019 0629   HDL 42 10/03/2019 0629   CHOLHDL 2.7 10/03/2019 0629   VLDL 18 10/03/2019 0629   LDLCALC 53 10/03/2019 0629    Other Studies Reviewed Today:   Echo 10/03/2019  1. Severe septal hypertrophy  with otherwise mild concentric hypertrophy. Left ventricular ejection fraction, by estimation, is 65 to 70%. The left ventricle has normal function. The left ventricle has no regional wall motion abnormalities. There is severe asymmetric left ventricular hypertrophy of the basal-septal segment. Left ventricular diastolic parameters are consistent with Grade II diastolic dysfunction (pseudonormalization). Elevated left ventricular end-diastolic pressure. 2. Right ventricular systolic function is normal. The right ventricular size is normal. There is normal pulmonary artery systolic pressure. 3. Left atrial size was severely dilated. 4. The mitral valve is normal in structure. No evidence of mitral valve regurgitation. No evidence of mitral stenosis. 5. The aortic valve is tricuspid. Aortic valve regurgitation is mild. No aortic stenosis  is present. 6. The inferior vena cava is normal in size with <50% respiratory variability, suggesting right atrial pressure of 8 mmHg.   Cardiac catheterization 10/02/2019  Conclusions: 1. Severe single vessel coronary artery disease with 80% proximal LAD stenosis at takeoff of large D1 branch. 2. Moderate, nonobstructive ostial RCA disease (DFR = 0.97). 3. Hyperdynamic left ventricular contraction with dynamic mid-cavity gradient (post-PVC gradient up to ~110 mmHg). 4. Mildly elevated left ventricular filling pressure (LVEDP 15-20 mmHg). 5. Successful PCI to proximal LAD using Resolute Onyx 3.0 x 15 mm DES with 0% residual stenosis and TIMI-3 flow.  Recommendations: 1. Overnight extended recovery. 2. Dual antiplatelet therapy with aspirin and clopidogrel for at least 6 months. 3. Aggressive secondary prevention. 4. Hold isosorbide mononitrate and add metoprolol succinate 12.5 mg daily for BP and heart rate control in the setting of possible HOCM. 5. Consider repeat outpatient echo versus cardiac MRI for further evaluation of possible HOCM.  Diagnostic Dominance: Right  Intervention      NST 09/25/2019  Narrative & Impression   Small, mild intensity, apical septal defect that is reversible and suggestive of ischemia. Increased TID ratio of 1.29 raises possibility of more diffuse balanced subendocardial ischemia.  Diagnostic ST segment depression was noted in the inferolateral leads with ST segment elevation in aVR raising the possibility of diffuse subendocardial ischemia, possibly multivessel distribution and therefore high risk findings.  Blood pressure demonstrated a hypertensive response to exercise.  This is a high risk study.  Nuclear stress EF: 52%.     Echocardiogram: 10/2017 Study Conclusions   - Left ventricle: The cavity size was normal. Systolic function was  vigorous. The estimated ejection fraction was in the range of 65%  to 70%. Wall motion was  normal; there were no regional wall  motion abnormalities. Features are consistent with a pseudonormal  left ventricular filling pattern, with concomitant abnormal  relaxation and increased filling pressure (grade 2 diastolic  dysfunction). Doppler parameters are consistent with high  ventricular filling pressure. Mild to moderate posterior wall and  severe proximal septal hypertrophy.  - Aortic valve: Mildly calcified annulus. Trileaflet. There was  mild regurgitation.  - Mitral valve: Mildly calcified annulus.  - Left atrium: The atrium was mildly dilated.  - Atrial septum: No defect or patent foramen ovale was identified.    Cardiac Catheterization: 10/2017  Ost RCA to Prox RCA lesion is 50% stenosed.  Prox RCA lesion is 20% stenosed.  Post Atrio lesion is 20% stenosed.  Prox LAD lesion is 75% stenosed.  Prox Cx to Mid Cx lesion is 10% stenosed.  1st Mrg lesion is 20% stenosed.  The left ventricular systolic function is normal.  LV end diastolic pressure is normal.  The left ventricular ejection fraction is 55-65% by visual estimate.  The proximal LAD has  an eccentric stenosis in the region of a large 4 branch diagonal vessel which in most views does not appear significantly narrowed but in the RAO cranial view appears at least 75%. FFR and DFR were discordant with mild positivity on DFR and negative FFR.   Mild nonobstructive 20% circumflex stenoses.  Probable proximal RCA spasm with narrowing up to 50% and 20% proximal and distal RCA stenoses.  Normal LV function with an ejection fraction of 65% without focal segmental wall motion abnormalities.  RECOMMENDATION: Since the patient was not on any prior anti-ischemic medications into his hospitalization, in light of the discordant flow data and large for branch diagonal vessel, the initial plan will be to attempt aggressive medical therapy with high potency statin therapy, addition of amlodipine,  nitrates and beta-blocker. Will discontinue HCTZ. Consider PCI if patient continues to experience recurrent symptomatology on a good medical regimen.  Recommend Aspirin 81mg  daily for moderate CAD.   Assessment and Plan:  1. CAD in native artery Denies any anginal or exertional symptoms.  Continue aspirin 81 mg daily.  Continue Plavix 75 mg daily.  2. Chest pain, unspecified type Cardiac catheterization showed severe single-vessel coronary artery disease with 80% proximal LAD stenosis at takeoff of large D1.  Moderate nonobstructive ostial RCA.  PCI to proximal LAD DES.  DAPT therapy for at least 6 months with aspirin and Plavix.  Continue aspirin 81 mg daily., Plavix 75 mg daily.  3. Essential hypertension Blood pressure is well controlled on current therapy.  Blood pressure today 122/64.  Continue amlodipine 10 mg daily, HCTZ 12.5 mg p.o. daily.  Losartan 100 mg daily.  4. Hyperlipidemia LDL goal <70 Continue Crestor 5 mg p.o. daily.  Lab work on 10/03/2019 showed TC 113, TG 92, HDL 42, LDL 153.  5. OSA (obstructive sleep apnea Patient on CPAP and compliant.  He has a follow-up with [facility] neurology associates Monday, February 28 3 PM for recheck related to sleep apnea  Medication Adjustments/Labs and Tests Ordered: Current medicines are reviewed at length with the patient today.  Concerns regarding medicines are outlined above.   Disposition: Follow-up with scheduled visit with Dr. Harl Bowie on November 1 8:40 AM in [place].  Signed, Levell July, NP 10/23/2019 3:42 PM    [facility] at [place], [place], [place] [address] Phone: [phone]; Fax: [phone]

## 2019-10-31 DIAGNOSIS — G4733 Obstructive sleep apnea (adult) (pediatric): Secondary | ICD-10-CM | POA: Diagnosis not present

## 2019-11-09 DIAGNOSIS — I2510 Atherosclerotic heart disease of native coronary artery without angina pectoris: Secondary | ICD-10-CM | POA: Diagnosis not present

## 2019-11-09 DIAGNOSIS — E7849 Other hyperlipidemia: Secondary | ICD-10-CM | POA: Diagnosis not present

## 2019-11-09 DIAGNOSIS — I10 Essential (primary) hypertension: Secondary | ICD-10-CM | POA: Diagnosis not present

## 2019-11-30 DIAGNOSIS — G4733 Obstructive sleep apnea (adult) (pediatric): Secondary | ICD-10-CM | POA: Diagnosis not present

## 2019-12-06 DIAGNOSIS — G4733 Obstructive sleep apnea (adult) (pediatric): Secondary | ICD-10-CM | POA: Diagnosis not present

## 2019-12-09 DIAGNOSIS — I10 Essential (primary) hypertension: Secondary | ICD-10-CM | POA: Diagnosis not present

## 2019-12-09 DIAGNOSIS — E7849 Other hyperlipidemia: Secondary | ICD-10-CM | POA: Diagnosis not present

## 2019-12-09 DIAGNOSIS — I2510 Atherosclerotic heart disease of native coronary artery without angina pectoris: Secondary | ICD-10-CM | POA: Diagnosis not present

## 2019-12-11 ENCOUNTER — Encounter: Payer: Self-pay | Admitting: Cardiology

## 2019-12-11 ENCOUNTER — Ambulatory Visit: Payer: Medicare Other | Admitting: Cardiology

## 2019-12-11 ENCOUNTER — Other Ambulatory Visit: Payer: Self-pay

## 2019-12-11 VITALS — BP 144/66 | HR 58 | Ht 66.0 in | Wt 223.0 lb

## 2019-12-11 DIAGNOSIS — E782 Mixed hyperlipidemia: Secondary | ICD-10-CM

## 2019-12-11 DIAGNOSIS — I421 Obstructive hypertrophic cardiomyopathy: Secondary | ICD-10-CM | POA: Diagnosis not present

## 2019-12-11 DIAGNOSIS — I2510 Atherosclerotic heart disease of native coronary artery without angina pectoris: Secondary | ICD-10-CM

## 2019-12-11 DIAGNOSIS — I10 Essential (primary) hypertension: Secondary | ICD-10-CM | POA: Diagnosis not present

## 2019-12-11 MED ORDER — LOSARTAN POTASSIUM 100 MG PO TABS
100.0000 mg | ORAL_TABLET | Freq: Every day | ORAL | 1 refills | Status: DC
Start: 2019-12-11 — End: 2020-04-29

## 2019-12-11 NOTE — Telephone Encounter (Signed)
Refilled losartan 100 mg

## 2019-12-11 NOTE — Patient Instructions (Signed)
Medication Instructions:  Your physician recommends that you continue on your current medications as directed. Please refer to the Current Medication list given to you today.  *If you need a refill on your cardiac medications before your next appointment, please call your pharmacy*   Lab Work: None today If you have labs (blood work) drawn today and your tests are completely normal, you will receive your results only by: . MyChart Message (if you have MyChart) OR . A paper copy in the mail If you have any lab test that is abnormal or we need to change your treatment, we will call you to review the results.   Testing/Procedures: None today   Follow-Up: At [facility], you and your health needs are our priority.  As part of our continuing mission to provide you with exceptional heart care, we have created designated Provider Care Teams.  These Care Teams include your primary Cardiologist (physician) and Advanced Practice Providers (APPs -  Physician Assistants and Nurse Practitioners) who all work together to provide you with the care you need, when you need it.  We recommend signing up for the patient portal called "MyChart".  Sign up information is provided on this After Visit Summary.  MyChart is used to connect with patients for Virtual Visits (Telemedicine).  Patients are able to view lab/test results, encounter notes, upcoming appointments, etc.  Non-urgent messages can be sent to your provider as well.   To learn more about what you can do with MyChart, go to [email or web address].    Your next appointment:   6 month(s)  The format for your next appointment:   In Person  Provider:   Jonathan Branch, MD   Other Instructions None       Thank you for choosing [facility] !         

## 2019-12-11 NOTE — Progress Notes (Signed)
Clinical Summary Mr. Beitler is a 74 y.o.male seen today for follow up of the following medical problems.   1. CAD - s/p cath in 10/2017 which showed eccentric stenosis along the proximal LAD which appeared to have 30% stenosis except RAO cranial view and appeared to have 75% stenosis with discordant mildly positive DFR and negative FFR and medical management initially recommended given large diagonal Gaynelle Pastrana)  09/2019 cath: 80% prox LAD, mod RCA nonobstructive. Received DES to LAD - 09/2019 echo LVEF severe septal hypertrophy, LVEF 65-70%, no WMAs, grade II DDx. No dynamic gradient reported  - no recent chest pain. No SOB or DOE - compliant with meds  2. HTN - compliant with meds, but has not taken yet today    3. Hyperlipidemia - 09/2019 TC 113 TG 92 HDL 42 LDL 54  4. Hypertrophic CM - severe septal hypertrophy by echo, no gradient by echo though had gradient during cath - no chest pain, no SOB, no lightheadedness or dizziness.   5. OSA - using cpap machine.   Past Medical History:  Diagnosis Date  . CAD (coronary artery disease)    a. s/p cath in 10/2017 which showed eccentric stenosis along the proximal LAD which appeared to have 30% stenosis except RAO cranial view and appeared to have 75% stenosis with discordant mildly positive DFR and negative FFR and medical management initially recommended given large diagonal Merit Gadsby  . Cancer ([place]) 07/2012   prostate  . Heart disease   . Hypertension      Allergies  Allergen Reactions  . Penicillins     Eye swelled shut  .Has patient had a PCN reaction causing immediate rash, facial/tongue/throat swelling, SOB or lightheadedness with hypotension: Yes Has patient had a PCN reaction causing severe rash involving mucus membranes or skin necrosis: No Has patient had a PCN reaction that required hospitalization: No Has patient had a PCN reaction occurring within the last 10 years: No If all of the above answers are "NO", then  may proceed with Cephalosporin use     Current Outpatient Medications  Medication Sig Dispense Refill  . amLODipine (NORVASC) 10 MG tablet Take 1 tablet by mouth once daily (Patient taking differently: Take 10 mg by mouth daily. ) 90 tablet 3  . aspirin 81 MG tablet Take 81 mg by mouth daily.    . clopidogrel (PLAVIX) 75 MG tablet Take 1 tablet (75 mg total) by mouth daily with breakfast. 90 tablet 3  . hydrochlorothiazide (MICROZIDE) 12.5 MG capsule Take 1 capsule (12.5 mg total) by mouth daily. 90 capsule 3  . losartan (COZAAR) 100 MG tablet Take 1 tablet (100 mg total) by mouth daily. 90 tablet 1  . metoprolol succinate (TOPROL-XL) 25 MG 24 hr tablet Take 0.5 tablets (12.5 mg total) by mouth daily. 90 tablet 3  . rosuvastatin (CRESTOR) 5 MG tablet Take 1 tablet (5 mg total) by mouth daily. 90 tablet 3   No current facility-administered medications for this visit.     Past Surgical History:  Procedure Laterality Date  . COLONOSCOPY N/A 08/28/2014   Procedure: COLONOSCOPY;  Surgeon: Aviva Signs, MD;  Location: [facility];  Service: Gastroenterology;  Laterality: N/A;  . CORONARY STENT INTERVENTION N/A 10/02/2019   Procedure: CORONARY STENT INTERVENTION;  Surgeon: Nelva Bush, MD;  Location: [place] [facility] [facility];  Service: Cardiovascular;  Laterality: N/A;  . INTRAVASCULAR PRESSURE WIRE/FFR STUDY N/A 11/04/2017   Procedure: INTRAVASCULAR PRESSURE WIRE/DFR vs FFR  STUDY;  Surgeon: Claiborne Billings,  Joyice Faster, MD;  Location: [place] [facility];  Service: Cardiovascular;  Laterality: N/A;  . INTRAVASCULAR PRESSURE WIRE/FFR STUDY N/A 10/02/2019   Procedure: INTRAVASCULAR PRESSURE WIRE/FFR STUDY;  Surgeon: Nelva Bush, MD;  Location: [place];  Service: Cardiovascular;  Laterality: N/A;  . LEFT HEART CATH AND CORONARY ANGIOGRAPHY N/A 11/04/2017   Procedure: LEFT HEART CATH AND CORONARY ANGIOGRAPHY;  Surgeon: Troy Sine, MD;  Location: [place] [facility];  Service: Cardiovascular;   Laterality: N/A;  . LEFT HEART CATH AND CORONARY ANGIOGRAPHY N/A 10/02/2019   Procedure: LEFT HEART CATH AND CORONARY ANGIOGRAPHY;  Surgeon: Nelva Bush, MD;  Location: [facility];  Service: Cardiovascular;  Laterality: N/A;  . PROSTATE SURGERY  12/2012  . SHOULDER SURGERY Bilateral      Allergies  Allergen Reactions  . Penicillins     Eye swelled shut  .Has patient had a PCN reaction causing immediate rash, facial/tongue/throat swelling, SOB or lightheadedness with hypotension: Yes Has patient had a PCN reaction causing severe rash involving mucus membranes or skin necrosis: No Has patient had a PCN reaction that required hospitalization: No Has patient had a PCN reaction occurring within the last 10 years: No If all of the above answers are "NO", then may proceed with Cephalosporin use      Family History  Problem Relation Age of Onset  . Stroke Mother   . Heart attack Father   . Stroke Father   . Kidney disease Brother      Social History Mr. Tino reports that he quit smoking about 43 years ago. His smoking use included cigarettes. He has a 4.50 pack-year smoking history. He quit smokeless tobacco use about 40 years ago. Mr. Sitar reports no history of alcohol use.   Review of Systems CONSTITUTIONAL: No weight loss, fever, chills, weakness or fatigue.  HEENT: Eyes: No visual loss, blurred vision, double vision or yellow sclerae.No hearing loss, sneezing, congestion, runny nose or sore throat.  SKIN: No rash or itching.  CARDIOVASCULAR: per hpi RESPIRATORY: No shortness of breath, cough or sputum.  GASTROINTESTINAL: No anorexia, nausea, vomiting or diarrhea. No abdominal pain or blood.  GENITOURINARY: No burning on urination, no polyuria NEUROLOGICAL: No headache, dizziness, syncope, paralysis, ataxia, numbness or tingling in the extremities. No change in bowel or bladder control.  MUSCULOSKELETAL: No muscle, back pain, joint pain or stiffness.    LYMPHATICS: No enlarged nodes. No history of splenectomy.  PSYCHIATRIC: No history of depression or anxiety.  ENDOCRINOLOGIC: No reports of sweating, cold or heat intolerance. No polyuria or polydipsia.  Marland Kitchen   Physical Examination Today's Vitals   12/11/19 0834  BP: (!) 144/66  Pulse: (!) 58  SpO2: 98%  Weight: 223 lb (101.2 kg)  Height: 5\' 6"  (1.676 m)   Body mass index is 35.99 kg/m.  Gen: resting comfortably, no acute distress HEENT: no scleral icterus, pupils equal round and reactive, no palptable cervical adenopathy,  CV: RRR, 2/6 systolic murmur rusb, no jvd Resp: Clear to auscultation bilaterally GI: abdomen is soft, non-tender, non-distended, normal bowel sounds, no hepatosplenomegaly MSK: extremities are warm, no edema.  Skin: warm, no rash Neuro:  no focal deficits Psych: appropriate affect   Diagnostic Studies Echocardiogram: 10/2017 Study Conclusions   - Left ventricle: The cavity size was normal. Systolic function was  vigorous. The estimated ejection fraction was in the range of 65%  to 70%. Wall motion was normal; there were no regional wall  motion abnormalities. Features are consistent with  a pseudonormal  left ventricular filling pattern, with concomitant abnormal  relaxation and increased filling pressure (grade 2 diastolic  dysfunction). Doppler parameters are consistent with high  ventricular filling pressure. Mild to moderate posterior wall and  severe proximal septal hypertrophy.  - Aortic valve: Mildly calcified annulus. Trileaflet. There was  mild regurgitation.  - Mitral valve: Mildly calcified annulus.  - Left atrium: The atrium was mildly dilated.  - Atrial septum: No defect or patent foramen ovale was identified.    Cardiac Catheterization: 10/2017  Ost RCA to Prox RCA lesion is 50% stenosed.  Prox RCA lesion is 20% stenosed.  Post Atrio lesion is 20% stenosed.  Prox LAD lesion is 75% stenosed.  Prox Cx to Mid  Cx lesion is 10% stenosed.  1st Mrg lesion is 20% stenosed.  The left ventricular systolic function is normal.  LV end diastolic pressure is normal.  The left ventricular ejection fraction is 55-65% by visual estimate.  The proximal LAD has an eccentric stenosis in the region of a large 4 Lianette Broussard diagonal vessel which in most views does not appear significantly narrowed but in the RAO cranial view appears at least 75%. FFR and DFR were discordant with mild positivity on DFR and negative FFR.   Mild nonobstructive 20% circumflex stenoses.  Probable proximal RCA spasm with narrowing up to 50% and 20% proximal and distal RCA stenoses.  Normal LV function with an ejection fraction of 65% without focal segmental wall motion abnormalities.  RECOMMENDATION: Since the patient was not on any prior anti-ischemic medications into his hospitalization, in light of the discordant flow data and large for Tayvin Preslar diagonal vessel, the initial plan will be to attempt aggressive medical therapy with high potency statin therapy, addition of amlodipine, nitrates and beta-blocker. Will discontinue HCTZ. Consider PCI if patient continues to experience recurrent symptomatology on a good medical regimen.  Recommend Aspirin 81mg  daily for moderate CAD.    Assessment and Plan  1. CAD - no symptoms, continue current meds. LIkely stop plavix 09/2020  2. HTN - elevated but has not taken all his bp meds yet today, was at goal at appointment in 10/2019 - continue current meds  3. Hyperlipidemia - at goal, continue statin  4. Hypertrophic obstructive cardiomyopathy - asymptomatic. If were to develop symptoms would adjust his bp medications in regards to diuretic and vasodilators - at 74 no strong indication for ICD risk stratification as time alone has shown he is not high risk     F/u 6 months   Arnoldo Lenis, M.D.

## 2019-12-31 DIAGNOSIS — G4733 Obstructive sleep apnea (adult) (pediatric): Secondary | ICD-10-CM | POA: Diagnosis not present

## 2020-01-09 DIAGNOSIS — I10 Essential (primary) hypertension: Secondary | ICD-10-CM | POA: Diagnosis not present

## 2020-01-09 DIAGNOSIS — I2510 Atherosclerotic heart disease of native coronary artery without angina pectoris: Secondary | ICD-10-CM | POA: Diagnosis not present

## 2020-01-09 DIAGNOSIS — E7849 Other hyperlipidemia: Secondary | ICD-10-CM | POA: Diagnosis not present

## 2020-01-30 DIAGNOSIS — G4733 Obstructive sleep apnea (adult) (pediatric): Secondary | ICD-10-CM | POA: Diagnosis not present

## 2020-03-01 DIAGNOSIS — G4733 Obstructive sleep apnea (adult) (pediatric): Secondary | ICD-10-CM | POA: Diagnosis not present

## 2020-03-07 DIAGNOSIS — G4733 Obstructive sleep apnea (adult) (pediatric): Secondary | ICD-10-CM | POA: Diagnosis not present

## 2020-04-01 DIAGNOSIS — G4733 Obstructive sleep apnea (adult) (pediatric): Secondary | ICD-10-CM | POA: Diagnosis not present

## 2020-04-08 ENCOUNTER — Telehealth (INDEPENDENT_AMBULATORY_CARE_PROVIDER_SITE_OTHER): Payer: Medicare Other | Admitting: Adult Health

## 2020-04-08 DIAGNOSIS — G4733 Obstructive sleep apnea (adult) (pediatric): Secondary | ICD-10-CM

## 2020-04-08 DIAGNOSIS — Z9989 Dependence on other enabling machines and devices: Secondary | ICD-10-CM

## 2020-04-08 NOTE — Progress Notes (Signed)
  [facility] [address] [place]. [place] [address] [phone]     Virtual Visit via Telephone Note  I connected with [name] on 04/08/20 at  3:00 PM EST by telephone located remotely at [facility] and verified that I am speaking with the correct person using two identifiers who reports being located at home.    Visit scheduled by RN. She discussed the limitations, risks, security and privacy concerns of performing an evaluation and management service by telephone and the availability of in person appointments. I also discussed with the patient that there may be a patient responsible charge related to this service. The patient expressed understanding and agreed to proceed. See telephone note for consent and additional scheduling information.    History of Present Illness:  [name] is a 75 y.o. male who has been followed in this office for OSA on CPAP.  He was originally scheduled for a video visit but was transitioned to a telephone visit due to lack of video access he reports that the CPAP is working well for him.  He denies any new issues.       Observations/Objective:  Generalized: Well developed, in no acute distress   Neurological examination  Mentation: Alert oriented to time, place, history taking. Follows all commands speech and language fluent  Assessment and Plan:  1: Obstructive sleep apnea on CPAP  -Good compliance -Encouraged patient to use machine greater than 4 hours each night -Follow-up in 1 year or sooner if needed   Follow Up Instructions:   F/U in 1 year or sooner if needed    I discussed the assessment and treatment plan with the patient.  The patient was provided an opportunity to ask questions and all were answered to their satisfaction. The patient agreed with the plan and verbalized an understanding of the instructions.   I spent 10 minutes of face-to-face and non-face-to-face time  with patient.  This included previsit chart review, lab review, study review, order entry, electronic health record documentation, patient education.     Ward Givens NP-C  [facility] [address] [place], [place] [address]  Phone [phone] Fax [phone] \

## 2020-04-29 ENCOUNTER — Other Ambulatory Visit: Payer: Self-pay | Admitting: Family Medicine

## 2020-04-29 ENCOUNTER — Other Ambulatory Visit: Payer: Self-pay

## 2020-04-29 DIAGNOSIS — G4733 Obstructive sleep apnea (adult) (pediatric): Secondary | ICD-10-CM | POA: Diagnosis not present

## 2020-04-29 MED ORDER — AMLODIPINE BESYLATE 10 MG PO TABS: 10.0000 mg | ORAL_TABLET | Freq: Every day | ORAL | 1 refills | Status: AC

## 2020-04-29 NOTE — Telephone Encounter (Signed)
Refilled amlodipine 10mg.

## 2020-05-08 DIAGNOSIS — I10 Essential (primary) hypertension: Secondary | ICD-10-CM | POA: Diagnosis not present

## 2020-05-08 DIAGNOSIS — E7849 Other hyperlipidemia: Secondary | ICD-10-CM | POA: Diagnosis not present

## 2020-05-08 DIAGNOSIS — I2510 Atherosclerotic heart disease of native coronary artery without angina pectoris: Secondary | ICD-10-CM | POA: Diagnosis not present

## 2020-05-10 ENCOUNTER — Telehealth: Payer: Self-pay | Admitting: *Deleted

## 2020-05-10 NOTE — Telephone Encounter (Signed)
It is noted under the recommendation in the note.

## 2020-05-10 NOTE — Telephone Encounter (Signed)
I do not see in office note he was to stop HCTZ, where was this?  Zandra Abts MD

## 2020-05-10 NOTE — Telephone Encounter (Signed)
Spoke with Margaretha Glassing from [facility]. Pt is confused about what medication he is to stop. Office note from 12/11/19 states pt is to stop taking HCTZ. Pt stopped taking Crestor. Would you still like him to D/C HCTZ? Please advise.

## 2020-05-13 ENCOUNTER — Other Ambulatory Visit: Payer: Self-pay | Admitting: *Deleted

## 2020-05-13 MED ORDER — HYDROCHLOROTHIAZIDE 12.5 MG PO CAPS: 12.5000 mg | ORAL_CAPSULE | Freq: Every day | ORAL | 3 refills | Status: DC

## 2020-05-13 MED ORDER — ROSUVASTATIN CALCIUM 5 MG PO TABS: 5.0000 mg | ORAL_TABLET | Freq: Every day | ORAL | 3 refills | Status: DC

## 2020-06-08 DIAGNOSIS — I10 Essential (primary) hypertension: Secondary | ICD-10-CM | POA: Diagnosis not present

## 2020-06-08 DIAGNOSIS — E7849 Other hyperlipidemia: Secondary | ICD-10-CM | POA: Diagnosis not present

## 2020-06-08 DIAGNOSIS — I2510 Atherosclerotic heart disease of native coronary artery without angina pectoris: Secondary | ICD-10-CM | POA: Diagnosis not present

## 2020-06-12 ENCOUNTER — Ambulatory Visit: Payer: Medicare Other | Admitting: Cardiology

## 2020-07-23 NOTE — Progress Notes (Signed)
Cardiology Office Note    Date:  07/29/2020   ID:  [name], DOB 12-Sep-1945, MRN [id number]   PCP:  James Sites, MD   [place]  Cardiologist:  James Dolly, MD  Advanced Practice Provider:  No care team member to display Electrophysiologist:  None   [phone]   Chief Complaint  Patient presents with   Follow-up     History of Present Illness:  [name] is a 75 y.o. male with history of HTN, HLD, HCM severe septal hypertrophy, OSA, CAD s/p cath in 10/2017 which showed eccentric stenosis along the proximal LAD which appeared to have 30% stenosis except RAO cranial view and appeared to have 75% stenosis with discordant mildly positive DFR and negative FFR and medical management initially recommended given large diagonal branch.09/2019 cath: DES to LAD, mod RCA nonobstructive.  09/2019 echo LVEF severe septal hypertrophy, LVEF 65-70%, no WMAs, grade II DDx. No dynamic gradient reported.  Patient saw Dr. Harl Copeland 12/11/2019 and was doing well without symptoms.  Plan to stop Plavix 09/2020.  Blood pressure was up but he had not taken his meds yet.  He was asymptomatic as far as his hypertrophic obstructive cardiomyopathy.  If he were to develop symptoms he recommended adjusting his blood pressure medications in regards to diuretics and vasodilators.  Patient comes in for f/u. Hasn't taken meds yet-doesn't ever take them at the same time everyday. Says he's not have a good day. Riding a bike a couple miles a day since he got it 2 weeks ago and walks around. [place] a [place] at the beach. No bleeding problems on Plavix. No chest pain.  Past Medical History:  Diagnosis Date   CAD (coronary artery disease)    a. s/p cath in 10/2017 which showed eccentric stenosis along the proximal LAD which appeared to have 30% stenosis except RAO cranial view and appeared to have 75% stenosis with discordant mildly positive DFR and negative FFR and medical  management initially recommended given large diagonal branch   Cancer ([place]) 07/2012   prostate   Heart disease    Hypertension     Past Surgical History:  Procedure Laterality Date   COLONOSCOPY N/A 08/28/2014   Procedure: COLONOSCOPY;  Surgeon: Aviva Signs, MD;  [facility];  Service: Gastroenterology;  Laterality: N/A;   CORONARY STENT INTERVENTION N/A 10/02/2019   Procedure: CORONARY STENT INTERVENTION;  Surgeon: Nelva Bush, MD;  Location: [place] [facility];  Service: Cardiovascular;  Laterality: N/A;   INTRAVASCULAR PRESSURE WIRE/FFR STUDY N/A 11/04/2017   Procedure: INTRAVASCULAR PRESSURE WIRE/DFR vs FFR  STUDY;  Surgeon: Troy Sine, MD;  Location: [place] [facility];  Service: Cardiovascular;  Laterality: N/A;   INTRAVASCULAR PRESSURE WIRE/FFR STUDY N/A 10/02/2019   Procedure: INTRAVASCULAR PRESSURE WIRE/FFR STUDY;  Surgeon: Nelva Bush, MD;  Location: [place] [facility];  Service: Cardiovascular;  Laterality: N/A;   LEFT HEART CATH AND CORONARY ANGIOGRAPHY N/A 11/04/2017   Procedure: LEFT HEART CATH AND CORONARY ANGIOGRAPHY;  Surgeon: Troy Sine, MD;  Location: [place] [facility];  Service: Cardiovascular;  Laterality: N/A;   LEFT HEART CATH AND CORONARY ANGIOGRAPHY N/A 10/02/2019   Procedure: LEFT HEART CATH AND CORONARY ANGIOGRAPHY;  Surgeon: Nelva Bush, MD;  Location: [place] [facility];  Service: Cardiovascular;  Laterality: N/A;   PROSTATE SURGERY  12/2012   SHOULDER SURGERY Bilateral     Current Medications: Current Meds  Medication Sig   amLODipine (NORVASC) 10  MG tablet Take 1 tablet (10 mg total) by mouth daily.   aspirin 81 MG tablet Take 81 mg by mouth daily.   clopidogrel (PLAVIX) 75 MG tablet Take 1 tablet (75 mg total) by mouth daily with breakfast.   hydrochlorothiazide (MICROZIDE) 12.5 MG capsule Take 1 capsule (12.5 mg total) by mouth daily.   losartan (COZAAR) 100 MG tablet Take 1 tablet by mouth once daily   metoprolol  succinate (TOPROL-XL) 25 MG 24 hr tablet Take 0.5 tablets (12.5 mg total) by mouth daily.   rosuvastatin (CRESTOR) 5 MG tablet Take 1 tablet (5 mg total) by mouth daily.     Allergies:   Penicillins   Social History   Socioeconomic History   Marital status: Married    Spouse name: Not on file   Number of children: Not on file   Years of education: Not on file   Highest education level: Not on file  Occupational History   Not on file  Tobacco Use   Smoking status: Former    Packs/day: 0.25    Years: 18.00    Pack years: 4.50    Types: Cigarettes    Quit date: 57    Years since quitting: 44.4   Smokeless tobacco: Former    Quit date: 02/09/1979  Vaping Use   Vaping Use: Never used  Substance and Sexual Activity   Alcohol use: No   Drug use: No   Sexual activity: Not on file  Other Topics Concern   Not on file  Social History Narrative   Lives   Caffeine use:    Social Determinants of Health   Financial Resource Strain: Not on file  Food Insecurity: Not on file  Transportation Needs: Not on file  Physical Activity: Not on file  Stress: Not on file  Social Connections: Not on file     Family History:  The patient's  family history includes Heart attack in his father; Kidney disease in his brother; Stroke in his father and mother.   ROS:   Please see the history of present illness.    ROS All other systems reviewed and are negative.   PHYSICAL EXAM:   VS:  BP (!) 162/84   Pulse (!) 56   Ht 5\' 6"  (1.676 m)   Wt 225 lb (102.1 kg)   SpO2 96%   BMI 36.32 kg/m   Physical Exam  ZOX:WRUEA, in no acute distress  Neck: no JVD, carotid bruits, or masses Cardiac:RRR; 3/6 harsh systolic murmur into carotids Respiratory:  clear to auscultation bilaterally, normal work of breathing GI: soft, nontender, nondistended, + BS Ext: trace ankle edema otherwise without cyanosis, clubbing, Good distal pulses bilaterally Neuro:  Alert and Oriented x 3 Psych: euthymic mood,  full affect  Wt Readings from Last 3 Encounters:  07/29/20 225 lb (102.1 kg)  12/11/19 223 lb (101.2 kg)  10/23/19 196 lb (88.9 kg)      Studies/Labs Reviewed:   EKG:  EKG is  ordered today.  The ekg ordered today demonstrates  NSR, IRBBB, nonspecific ST changes, no acute change  Recent Labs: 10/03/2019: BUN 10; Creatinine, Ser 0.87; Hemoglobin 14.0; Platelets 228; Potassium 4.0; Sodium 140   Lipid Panel    Component Value Date/Time   CHOL 113 10/03/2019 0629   TRIG 92 10/03/2019 0629   HDL 42 10/03/2019 0629   CHOLHDL 2.7 10/03/2019 0629   VLDL 18 10/03/2019 0629   LDLCALC 53 10/03/2019 0629    Additional studies/ records that were  reviewed today include:  Echocardiogram: 10/2017 Study Conclusions   - Left ventricle: The cavity size was normal. Systolic function was    vigorous. The estimated ejection fraction was in the range of 65%    to 70%. Wall motion was normal; there were no regional wall    motion abnormalities. Features are consistent with a pseudonormal    left ventricular filling pattern, with concomitant abnormal    relaxation and increased filling pressure (grade 2 diastolic    dysfunction). Doppler parameters are consistent with high    ventricular filling pressure. Mild to moderate posterior wall and    severe proximal septal hypertrophy.  - Aortic valve: Mildly calcified annulus. Trileaflet. There was    mild regurgitation.  - Mitral valve: Mildly calcified annulus.  - Left atrium: The atrium was mildly dilated.  - Atrial septum: No defect or patent foramen ovale was identified.      Cardiac Catheterization: 10/2017 Ost RCA to Prox RCA lesion is 50% stenosed. Prox RCA lesion is 20% stenosed. Post Atrio lesion is 20% stenosed. Prox LAD lesion is 75% stenosed. Prox Cx to Mid Cx lesion is 10% stenosed. 1st Mrg lesion is 20% stenosed. The left ventricular systolic function is normal. LV end diastolic pressure is normal. The left ventricular ejection  fraction is 55-65% by visual estimate.   The proximal LAD has an eccentric stenosis in the region of a large 4 branch diagonal vessel which in most views does not appear significantly narrowed but in the RAO cranial view appears at least 75%.  FFR and DFR were discordant with mild positivity on DFR and negative FFR.   Mild nonobstructive 20% circumflex stenoses.   Probable proximal RCA spasm with narrowing up to 50% and 20% proximal and distal RCA stenoses.   Normal LV function with an ejection fraction of 65% without focal segmental wall motion abnormalities.   RECOMMENDATION: Since the patient was not on any prior anti-ischemic medications into his hospitalization, in light of the discordant flow data and large for branch diagonal vessel, the initial plan will be to attempt aggressive medical therapy with high potency statin therapy, addition of amlodipine, nitrates and beta-blocker.  Will discontinue HCTZ.  Consider PCI if patient continues to experience recurrent symptomatology on a good medical regimen.   Recommend Aspirin 81mg  daily for moderate CAD.       Risk Assessment/Calculations:         ASSESSMENT:    1. Coronary artery disease involving native coronary artery of native heart without angina pectoris   2. Obstructive hypertrophic cardiomyopathy ([place])   3. Essential hypertension   4. Mixed hyperlipidemia   5. Obesity, unspecified classification, unspecified obesity type, unspecified whether serious comorbidity present      PLAN:  In order of problems listed above:  CAD status post DES to the LAD 09/2019 with moderate RCA nonobstructive plan to stop Plavix 09/2020, continue ASA  Hypertrophic obstructive cardiomyopathy Dr. Harl Copeland recommended if he has symptoms to adjust diuretics and vasodilators.  Given his age no strong indication for ICD re stratification. No symptoms  Hypertension BP up today but hasn't taken his meds yet. Doesn't take them at same time everyday. I  asked him to set an alarm reminder to take meds same time. He will check BP for 2 weeks and call us with result. He checks BP occasionally and runs 130-140/70-80's. 2 gm sodium diet  Hyperlipidemia on crestor. Due for labs 09/2019-PCP will draw per patient  Obesity-weight loss and exercise discussed. Offered  referral to weight loss center but wants to look into weight watchers.  Shared Decision Making/Informed Consent        Medication Adjustments/Labs and Tests Ordered: Current medicines are reviewed at length with the patient today.  Concerns regarding medicines are outlined above.  Medication changes, Labs and Tests ordered today are listed in the Patient Instructions below. Patient Instructions  Medication Instructions:  Your physician has recommended you make the following change in your medication:  STOP Plavix in AUGUST  *If you need a refill on your cardiac medications before your next appointment, please call your pharmacy*   Lab Work: None If you have labs (blood work) drawn today and your tests are completely normal, you will receive your results only by: [place] (if you have MyChart) OR A paper copy in the mail If you have any lab test that is abnormal or we need to change your treatment, we will call you to review the results.   Testing/Procedures: None   Follow-Up: At [facility], you and your health needs are our priority.  As part of our continuing mission to provide you with exceptional heart care, we have created designated Provider Care Teams.  These Care Teams include your primary Cardiologist (physician) and Advanced Practice Providers (APPs -  Physician Assistants and Nurse Practitioners) who all work together to provide you with the care you need, when you need it.  We recommend signing up for the patient portal called "MyChart".  Sign up information is provided on this After Visit Summary.  MyChart is used to connect with patients for Virtual  Visits (Telemedicine).  Patients are able to view lab/test results, encounter notes, upcoming appointments, etc.  Non-urgent messages can be sent to your provider as well.   To learn more about what you can do with MyChart, go to [email or web address].    Your next appointment:   4 month(s)  The format for your next appointment:   In Person  Provider:   Carlyle Dolly, MD   Other Instructions Two Gram Sodium Diet 2000 mg  What is Sodium? Sodium is a mineral found naturally in many foods. The most significant source of sodium in the diet is table salt, which is about 40% sodium.  Processed, convenience, and preserved foods also contain a large amount of sodium.  The body needs only 500 mg of sodium daily to function,  A normal diet provides more than enough sodium even if you do not use salt.  Why Limit Sodium? A build up of sodium in the body can cause thirst, increased blood pressure, shortness of breath, and water retention.  Decreasing sodium in the diet can reduce edema and risk of heart attack or stroke associated with high blood pressure.  Keep in mind that there are many other factors involved in these health problems.  Heredity, obesity, lack of exercise, cigarette smoking, stress and what you eat all play a role.  General Guidelines: Do not add salt at the table or in cooking.  One teaspoon of salt contains over 2 grams of sodium. Read food labels Avoid processed and convenience foods Ask your dietitian before eating any foods not dicussed in the menu planning guidelines Consult your physician if you wish to use a salt substitute or a sodium containing medication such as antacids.  Limit milk and milk products to 16 oz (2 cups) per day.  Shopping Hints: READ LABELS!! "Dietetic" does not necessarily mean low sodium. Salt and other sodium ingredients are often added to  foods during processing.    Menu Planning Guidelines Food Group Choose More Often Avoid  Beverages (see  also the milk group All fruit juices, low-sodium, salt-free vegetables juices, low-sodium carbonated beverages Regular vegetable or tomato juices, commercially softened water used for drinking or cooking  Breads and Cereals Enriched white, wheat, rye and pumpernickel bread, hard rolls and dinner rolls; muffins, cornbread and waffles; most dry cereals, cooked cereal without added salt; unsalted crackers and breadsticks; low sodium or homemade bread crumbs Bread, rolls and crackers with salted tops; quick breads; instant hot cereals; pancakes; commercial bread stuffing; self-rising flower and biscuit mixes; regular bread crumbs or cracker crumbs  Desserts and Sweets Desserts and sweets mad with mild should be within allowance Instant pudding mixes and cake mixes  Fats Butter or margarine; vegetable oils; unsalted salad dressings, regular salad dressings limited to 1 Tbs; light, sour and heavy cream Regular salad dressings containing bacon fat, bacon bits, and salt pork; snack dips made with instant soup mixes or processed cheese; salted nuts  Fruits Most fresh, frozen and canned fruits Fruits processed with salt or sodium-containing ingredient (some dried fruits are processed with sodium sulfites        Vegetables Fresh, frozen vegetables and low- sodium canned vegetables Regular canned vegetables, sauerkraut, pickled vegetables, and others prepared in brine; frozen vegetables in sauces; vegetables seasoned with ham, bacon or salt pork  Condiments, Sauces, Miscellaneous  Salt substitute with physician's approval; pepper, herbs, spices; vinegar, lemon or lime juice; hot pepper sauce; garlic powder, onion powder, low sodium soy sauce (1 Tbs.); low sodium condiments (ketchup, chili sauce, mustard) in limited amounts (1 tsp.) fresh ground horseradish; unsalted tortilla chips, pretzels, potato chips, popcorn, salsa (1/4 cup) Any seasoning made with salt including garlic salt, celery salt, onion salt, and  seasoned salt; sea salt, rock salt, kosher salt; meat tenderizers; monosodium glutamate; mustard, regular soy sauce, barbecue, sauce, chili sauce, teriyaki sauce, steak sauce, Worcestershire sauce, and most flavored vinegars; canned gravy and mixes; regular condiments; salted snack foods, olives, picles, relish, horseradish sauce, catsup   Food preparation: Try these seasonings Meats:    Pork Sage, onion Serve with applesauce  Chicken Poultry seasoning, thyme, parsley Serve with cranberry sauce  Lamb Curry powder, rosemary, garlic, thyme Serve with mint sauce or jelly  Veal Marjoram, basil Serve with current jelly, cranberry sauce  Beef Pepper, bay leaf Serve with dry mustard, unsalted chive butter  Fish [place] leaf, dill Serve with unsalted lemon butter, unsalted parsley butter  Vegetables:    Asparagus Lemon juice   Broccoli Lemon juice   Carrots Mustard dressing parsley, mint, nutmeg, glazed with unsalted butter and sugar   Green beans Marjoram, lemon juice, nutmeg,dill seed   Tomatoes Basil, marjoram, onion   Spice /blend for [employer]" 4 tsp ground thyme 1 tsp ground sage 3 tsp ground rosemary 4 tsp ground marjoram   Test your knowledge A product that says "Salt Free" may still contain sodium. True or False Garlic Powder and Hot Pepper Sauce an be used as alternative seasonings.True or False Processed foods have more sodium than fresh foods.  True or False Canned Vegetables have less sodium than froze True or False   WAYS TO DECREASE YOUR SODIUM INTAKE Avoid the use of added salt in cooking and at the table.  Table salt (and other prepared seasonings which contain salt) is probably one of the greatest sources of sodium in the diet.  Unsalted foods can gain flavor from the sweet, sour, and  butter taste sensations of herbs and spices.  Instead of using salt for seasoning, try the following seasonings with the foods listed.  Remember: how you use them to enhance natural food flavors is  limited only by your creativity... Allspice-Meat, fish, eggs, fruit, peas, red and yellow vegetables Almond Extract-Fruit baked goods Anise Seed-Sweet breads, fruit, carrots, beets, cottage cheese, cookies (tastes like licorice) Basil-Meat, fish, eggs, vegetables, rice, vegetables salads, soups, sauces Bay Leaf-Meat, fish, stews, poultry Burnet-Salad, vegetables (cucumber-like flavor) Caraway Seed-Bread, cookies, cottage cheese, meat, vegetables, cheese, rice Cardamon-Baked goods, fruit, soups Celery Powder or seed-Salads, salad dressings, sauces, meatloaf, soup, bread.Do not use  celery salt Chervil-Meats, salads, fish, eggs, vegetables, cottage cheese (parsley-like flavor) Chili Power-Meatloaf, chicken cheese, corn, eggplant, egg dishes Chives-Salads cottage cheese, egg dishes, soups, vegetables, sauces Cilantro-Salsa, casseroles Cinnamon-Baked goods, fruit, pork, lamb, chicken, carrots Cloves-Fruit, baked goods, fish, pot roast, green beans, beets, carrots Coriander-Pastry, cookies, meat, salads, cheese (lemon-orange flavor) Cumin-Meatloaf, fish,cheese, eggs, cabbage,fruit pie (caraway flavor) [employer], fruit, eggs, fish, poultry, cottage cheese, vegetables Dill Seed-Meat, cottage cheese, poultry, vegetables, fish, salads, bread Fennel Seed-Bread, cookies, apples, pork, eggs, fish, beets, cabbage, cheese, Licorice-like flavor Garlic-(buds or powder) Salads, meat, poultry, fish, bread, butter, vegetables, potatoes.Do not  use garlic salt Ginger-Fruit, vegetables, baked goods, meat, fish, poultry Horseradish Root-Meet, vegetables, butter Lemon Juice or Extract-Vegetables, fruit, tea, baked goods, fish salads Mace-Baked goods fruit, vegetables, fish, poultry (taste like nutmeg) Maple Extract-Syrups Marjoram-Meat, chicken, fish, vegetables, breads, green salads (taste like Sage) Mint-Tea, lamb, sherbet, vegetables, desserts, carrots, cabbage Mustard, Dry or Seed-Cheese, eggs,  meats, vegetables, poultry Nutmeg-Baked goods, fruit, chicken, eggs, vegetables, desserts Onion Powder-Meat, fish, poultry, vegetables, cheese, eggs, bread, rice salads (Do not use   Onion salt) Orange Extract-Desserts, baked goods Oregano-Pasta, eggs, cheese, onions, pork, lamb, fish, chicken, vegetables, green salads Paprika-Meat, fish, poultry, eggs, cheese, vegetables Parsley Flakes-Butter, vegetables, meat fish, poultry, eggs, bread, salads (certain forms may   Contain sodium Pepper-Meat fish, poultry, vegetables, eggs Peppermint Extract-Desserts, baked goods Poppy Seed-Eggs, bread, cheese, fruit dressings, baked goods, noodles, vegetables, cottage  [employer], poultry, meat, fish, cauliflower, turnips,eggs bread Saffron-Rice, bread, veal, chicken, fish, eggs Sage-Meat, fish, poultry, onions, eggplant, tomateos, pork, stews Savory-Eggs, salads, poultry, meat, rice, vegetables, soups, pork Tarragon-Meat, poultry, fish, eggs, butter, vegetables (licorice-like flavor)  Thyme-Meat, poultry, fish, eggs, vegetables, (clover-like flavor), sauces, soups Tumeric-Salads, butter, eggs, fish, rice, vegetables (saffron-like flavor) Vanilla Extract-Baked goods, candy Vinegar-Salads, vegetables, meat marinades Walnut Extract-baked goods, candy   2. Choose your Foods Wisely   The following is a list of foods to avoid which are high in sodium:  Meats-Avoid all smoked, canned, salt cured, dried and kosher meat and fish as well as Anchovies   [employer] meats:Bologna, Liverwurst, Pastrami Canned meat or fish  Marinated herring Caviar    Pepperoni Corned Beef   Pizza Dried chipped beef  Salami Frozen breaded fish or meat Salt pork Frankfurters or hot dogs  Sardines Gefilte fish   Sausage Ham (boiled ham, Proscuitto Smoked butt    spiced ham)   Spam      TV Dinners Vegetables Canned vegetables (Regular) Relish Canned  mushrooms  Sauerkraut Olives    Tomato juice Pickles  Bakery and Dessert Products Canned puddings  Cream pies Cheesecake   Decorated cakes Cookies  Beverages/Juices Tomato juice, regular  Gatorade   V-8 vegetable juice, regular  Breads and Cereals Biscuit mixes   Salted potato chips, corn chips, pretzels Bread stuffing mixes  Salted crackers and rolls Pancake and waffle mixes Self-rising flour  Seasonings Accent    Meat sauces Barbecue sauce  Meat tenderizer Catsup    Monosodium glutamate (MSG) Celery salt   Onion salt Chili sauce   Prepared mustard Garlic salt   Salt, seasoned salt, sea salt Gravy mixes   Soy sauce Horseradish   Steak sauce Ketchup   Tartar sauce Lite salt    Teriyaki sauce Marinade mixes   Worcestershire sauce  Others Baking powder   Cocoa and cocoa mixes Baking soda   Commercial casserole mixes Candy-caramels, chocolate  Dehydrated soups    Bars, fudge,nougats  Instant rice and pasta mixes Canned broth or soup  Maraschino cherries Cheese, aged and processed cheese and cheese spreads  Learning Assessment Quiz  Indicated T (for True) or F (for False) for each of the following statements:  _____ Fresh fruits and vegetables and unprocessed grains are generally low in sodium _____ Water may contain a considerable amount of sodium, depending on the source _____ You can always tell if a food is high in sodium by tasting it _____ Certain laxatives my be high in sodium and should be avoided unless prescribed   by a physician or pharmacist _____ Salt substitutes may be used freely by anyone on a sodium restricted diet _____ Sodium is present in table salt, food additives and as a natural component of   most foods _____ Table salt is approximately 90% sodium _____ Limiting sodium intake may help prevent excess fluid accumulation in the body _____ On a sodium-restricted diet, seasonings such as bouillon soy sauce, and    cooking wine should be used in place  of table salt _____ On an ingredient list, a product which lists monosodium glutamate as the first   ingredient is an appropriate food to include on a low sodium diet  Circle the best answer(s) to the following statements (Hint: there may be more than one correct answer)  11. On a low-sodium diet, some acceptable snack items are:    A. Olives  F. Bean dip   K. Grapefruit juice    B. Salted Pretzels G. Commercial Popcorn   L. Canned peaches    C. Carrot Sticks  H. Bouillon   M. Unsalted nuts   D. [place] fries  I. Peanut butter crackers N. Salami   E. Sweet pickles J. Tomato Juice   O. Pizza  12.  Seasonings that may be used freely on a reduced - sodium diet include   A. Lemon wedges F.Monosodium glutamate K. Celery seed    B.Soysauce   G. Pepper   L. Mustard powder   C. Sea salt  H. Cooking wine  M. Onion flakes   D. Vinegar  E. Prepared horseradish N. Salsa   E. Sage   J. Worcestershire sauce  O. Chutney   Calorie Counting for Weight Loss- WEIGHT WATCHERS (Best Choice) Calories are units of energy. Your body needs a certain number of calories from food to keep going throughout the day. When you eat or drink more calories than your body needs, your body stores the extra calories mostly as fat. When you eat or drink fewer calories than your body needs, your body burns fat to getthe energy it needs. Calorie counting means keeping track of how many calories you eat and drink each day. Calorie counting can be helpful if you need to lose weight. If you eat fewer calories than your body needs, you should lose weight. Ask yourhealth care  provider what a healthy weight is for you. For calorie counting to work, you will need to eat the right number of calories each day to lose a healthy amount of weight per week. A dietitian can help you figure out how many calories you need in a day and will suggest ways to reach your calorie goal. A healthy amount of weight to lose each week is usually 1-2  lb (0.5-0.9 kg). This usually means that your daily calorie intake should be reduced by 500-750 calories. Eating 1,200-1,500 calories a day can help most women lose weight. Eating 1,500-1,800 calories a day can help most men lose weight. What do I need to know about calorie counting? Work with your health care provider or dietitian to determine how many calories you should get each day. To meet your daily calorie goal, you will need to: Find out how many calories are in each food that you would like to eat. Try to do this before you eat. Decide how much of the food you plan to eat. Keep a food log. Do this by writing down what you ate and how many calories it had. To successfully lose weight, it is important to balance calorie counting with ahealthy lifestyle that includes regular activity. Where do I find calorie information?  The number of calories in a food can be found on a Nutrition Facts label. If a food does not have a Nutrition Facts label, try to look up the calories onlineor ask your dietitian for help. Remember that calories are listed per serving. If you choose to have more than one serving of a food, you will have to multiply the calories per serving by the number of servings you plan to eat. For example, the label on a package of bread might say that a serving size is 1 slice and that there are 90 calories in a serving. If you eat 1 slice, you will have eaten 90 calories. If you eat 2slices, you will have eaten 180 calories. How do I keep a food log? After each time that you eat, record the following in your food log as soon as possible: What you ate. Be sure to include toppings, sauces, and other extras on the food. How much you ate. This can be measured in cups, ounces, or number of items. How many calories were in each food and drink. The total number of calories in the food you ate. Keep your food log near you, such as in a pocket-sized notebook or on an app or website on your  mobile phone. Some programs will calculate calories for you andshow you how many calories you have left to meet your daily goal. What are some portion-control tips? Know how many calories are in a serving. This will help you know how many servings you can have of a certain food. Use a measuring cup to measure serving sizes. You could also try weighing out portions on a kitchen scale. With time, you will be able to estimate serving sizes for some foods. Take time to put servings of different foods on your favorite plates or in your favorite bowls and cups so you know what a serving looks like. Try not to eat straight from a food's packaging, such as from a bag or box. Eating straight from the package makes it hard to see how much you are eating and can lead to overeating. Put the amount you would like to eat in a cup or on a plate to make  sure you are eating the right portion. Use smaller plates, glasses, and bowls for smaller portions and to prevent overeating. Try not to multitask. For example, avoid watching TV or using your computer while eating. If it is time to eat, sit down at a table and enjoy your food. This will help you recognize when you are full. It will also help you be more mindful of what and how much you are eating. What are tips for following this plan? Reading food labels Check the calorie count compared with the serving size. The serving size may be smaller than what you are used to eating. Check the source of the calories. Try to choose foods that are high in protein, fiber, and vitamins, and low in saturated fat, trans fat, and sodium. Shopping Read nutrition labels while you shop. This will help you make healthy decisions about which foods to buy. Pay attention to nutrition labels for low-fat or fat-free foods. These foods sometimes have the same number of calories or more calories than the full-fat versions. They also often have added sugar, starch, or salt to make up for flavor  that was removed with the fat. Make a grocery list of lower-calorie foods and stick to it. Cooking Try to cook your favorite foods in a healthier way. For example, try baking instead of frying. Use low-fat dairy products. Meal planning Use more fruits and vegetables. One-half of your plate should be fruits and vegetables. Include lean proteins, such as chicken, [place], and fish. Lifestyle Each week, aim to do one of the following: 150 minutes of moderate exercise, such as walking. 75 minutes of vigorous exercise, such as running. General information Know how many calories are in the foods you eat most often. This will help you calculate calorie counts faster. Find a way of tracking calories that works for you. Get creative. Try different apps or programs if writing down calories does not work for you. What foods should I eat?  Eat nutritious foods. It is better to have a nutritious, high-calorie food, such as an avocado, than a food with few nutrients, such as a bag of potato chips. Use your calories on foods and drinks that will fill you up and will not leave you hungry soon after eating. Examples of foods that fill you up are nuts and nut butters, vegetables, lean proteins, and high-fiber foods such as whole grains. High-fiber foods are foods with more than 5 g of fiber per serving. Pay attention to calories in drinks. Low-calorie drinks include water and unsweetened drinks. The items listed above may not be a complete list of foods and beverages you can eat. Contact a dietitian for more information. What foods should I limit? Limit foods or drinks that are not good sources of vitamins, minerals, or protein or that are high in unhealthy fats. These include: Candy. Other sweets. Sodas, specialty coffee drinks, alcohol, and juice. The items listed above may not be a complete list of foods and beverages you should avoid. Contact a dietitian for more information. How do I count calories  when eating out? Pay attention to portions. Often, portions are much larger when eating out. Try these tips to keep portions smaller: Consider sharing a meal instead of getting your own. If you get your own meal, eat only half of it. Before you start eating, ask for a container and put half of your meal into it. When available, consider ordering smaller portions from the menu instead of full portions. Pay attention to  your food and drink choices. Knowing the way food is cooked and what is included with the meal can help you eat fewer calories. If calories are listed on the menu, choose the lower-calorie options. Choose dishes that include vegetables, fruits, whole grains, low-fat dairy products, and lean proteins. Choose items that are boiled, broiled, grilled, or steamed. Avoid items that are buttered, battered, fried, or served with cream sauce. Items labeled as crispy are usually fried, unless stated otherwise. Choose water, low-fat milk, unsweetened iced tea, or other drinks without added sugar. If you want an alcoholic beverage, choose a lower-calorie option, such as a glass of wine or light beer. Ask for dressings, sauces, and syrups on the side. These are usually high in calories, so you should limit the amount you eat. If you want a salad, choose a garden salad and ask for grilled meats. Avoid extra toppings such as bacon, cheese, or fried items. Ask for the dressing on the side, or ask for olive oil and vinegar or lemon to use as dressing. Estimate how many servings of a food you are given. Knowing serving sizes will help you be aware of how much food you are eating at restaurants. Where to find more information Centers for Disease Control and Prevention: http://www.wolf.info/ [place] Department of Agriculture: http://www.wilson-mendoza.org/ Summary Calorie counting means keeping track of how many calories you eat and drink each day. If you eat fewer calories than your body needs, you should lose weight. A healthy amount  of weight to lose per week is usually 1-2 lb (0.5-0.9 kg). This usually means reducing your daily calorie intake by 500-750 calories. The number of calories in a food can be found on a Nutrition Facts label. If a food does not have a Nutrition Facts label, try to look up the calories online or ask your dietitian for help. Use smaller plates, glasses, and bowls for smaller portions and to prevent overeating. Use your calories on foods and drinks that will fill you up and not leave you hungry shortly after a meal. This information is not intended to replace advice given to you by your health care provider. Make sure you discuss any questions you have with your healthcare provider. Document Revised: 03/09/2019 Document Reviewed: 03/09/2019 Elsevier Patient Education  2022 [place].  Please keep blood pressure log for two weeks and call our office to update [place] on how your blood pressures are. REMEMBER to take your bp at the same time everyday, two hours after medications.    Sumner Boast, PA-C  07/29/2020 11:30 AM    [facility] [place], [place],   [address] Phone: [phone]; Fax: [phone]

## 2020-07-29 ENCOUNTER — Other Ambulatory Visit: Payer: Self-pay

## 2020-07-29 ENCOUNTER — Encounter: Payer: Self-pay | Admitting: Physician Assistant

## 2020-07-29 ENCOUNTER — Ambulatory Visit: Payer: Medicare Other | Admitting: Physician Assistant

## 2020-07-29 VITALS — BP 162/84 | HR 56 | Ht 66.0 in | Wt 225.0 lb

## 2020-07-29 DIAGNOSIS — I421 Obstructive hypertrophic cardiomyopathy: Secondary | ICD-10-CM

## 2020-07-29 DIAGNOSIS — E782 Mixed hyperlipidemia: Secondary | ICD-10-CM | POA: Diagnosis not present

## 2020-07-29 DIAGNOSIS — I10 Essential (primary) hypertension: Secondary | ICD-10-CM

## 2020-07-29 DIAGNOSIS — I2510 Atherosclerotic heart disease of native coronary artery without angina pectoris: Secondary | ICD-10-CM | POA: Diagnosis not present

## 2020-07-29 DIAGNOSIS — E669 Obesity, unspecified: Secondary | ICD-10-CM

## 2020-07-29 NOTE — Patient Instructions (Signed)
Medication Instructions:  Your physician has recommended you make the following change in your medication:  STOP Plavix in AUGUST  *If you need a refill on your cardiac medications before your next appointment, please call your pharmacy*   Lab Work: None If you have labs (blood work) drawn today and your tests are completely normal, you will receive your results only by: [place] (if you have MyChart) OR A paper copy in the mail If you have any lab test that is abnormal or we need to change your treatment, we will call you to review the results.   Testing/Procedures: None   Follow-Up: At [facility], you and your health needs are our priority.  As part of our continuing mission to provide you with exceptional heart care, we have created designated Provider Care Teams.  These Care Teams include your primary Cardiologist (physician) and Advanced Practice Providers (APPs -  Physician Assistants and Nurse Practitioners) who all work together to provide you with the care you need, when you need it.  We recommend signing up for the patient portal called "MyChart".  Sign up information is provided on this After Visit Summary.  MyChart is used to connect with patients for Virtual Visits (Telemedicine).  Patients are able to view lab/test results, encounter notes, upcoming appointments, etc.  Non-urgent messages can be sent to your provider as well.   To learn more about what you can do with MyChart, go to [email or web address].    Your next appointment:   4 month(s)  The format for your next appointment:   In Person  Provider:   Carlyle Dolly, MD   Other Instructions Two Gram Sodium Diet 2000 mg  What is Sodium? Sodium is a mineral found naturally in many foods. The most significant source of sodium in the diet is table salt, which is about 40% sodium.  Processed, convenience, and preserved foods also contain a large amount of sodium.  The body needs only 500 mg of sodium  daily to function,  A normal diet provides more than enough sodium even if you do not use salt.  Why Limit Sodium? A build up of sodium in the body can cause thirst, increased blood pressure, shortness of breath, and water retention.  Decreasing sodium in the diet can reduce edema and risk of heart attack or stroke associated with high blood pressure.  Keep in mind that there are many other factors involved in these health problems.  Heredity, obesity, lack of exercise, cigarette smoking, stress and what you eat all play a role.  General Guidelines: Do not add salt at the table or in cooking.  One teaspoon of salt contains over 2 grams of sodium. Read food labels Avoid processed and convenience foods Ask your dietitian before eating any foods not dicussed in the menu planning guidelines Consult your physician if you wish to use a salt substitute or a sodium containing medication such as antacids.  Limit milk and milk products to 16 oz (2 cups) per day.  Shopping Hints: READ LABELS!! "Dietetic" does not necessarily mean low sodium. Salt and other sodium ingredients are often added to foods during processing.    Menu Planning Guidelines Food Group Choose More Often Avoid  Beverages (see also the milk group All fruit juices, low-sodium, salt-free vegetables juices, low-sodium carbonated beverages Regular vegetable or tomato juices, commercially softened water used for drinking or cooking  Breads and Cereals Enriched white, wheat, rye and pumpernickel bread, hard rolls and dinner rolls; muffins, cornbread and  waffles; most dry cereals, cooked cereal without added salt; unsalted crackers and breadsticks; low sodium or homemade bread crumbs Bread, rolls and crackers with salted tops; quick breads; instant hot cereals; pancakes; commercial bread stuffing; self-rising flower and biscuit mixes; regular bread crumbs or cracker crumbs  Desserts and Sweets Desserts and sweets mad with mild should be within  allowance Instant pudding mixes and cake mixes  Fats Butter or margarine; vegetable oils; unsalted salad dressings, regular salad dressings limited to 1 Tbs; light, sour and heavy cream Regular salad dressings containing bacon fat, bacon bits, and salt pork; snack dips made with instant soup mixes or processed cheese; salted nuts  Fruits Most fresh, frozen and canned fruits Fruits processed with salt or sodium-containing ingredient (some dried fruits are processed with sodium sulfites        Vegetables Fresh, frozen vegetables and low- sodium canned vegetables Regular canned vegetables, sauerkraut, pickled vegetables, and others prepared in brine; frozen vegetables in sauces; vegetables seasoned with ham, bacon or salt pork  Condiments, Sauces, Miscellaneous  Salt substitute with physician's approval; pepper, herbs, spices; vinegar, lemon or lime juice; hot pepper sauce; garlic powder, onion powder, low sodium soy sauce (1 Tbs.); low sodium condiments (ketchup, chili sauce, mustard) in limited amounts (1 tsp.) fresh ground horseradish; unsalted tortilla chips, pretzels, potato chips, popcorn, salsa (1/4 cup) Any seasoning made with salt including garlic salt, celery salt, onion salt, and seasoned salt; sea salt, rock salt, kosher salt; meat tenderizers; monosodium glutamate; mustard, regular soy sauce, barbecue, sauce, chili sauce, teriyaki sauce, steak sauce, Worcestershire sauce, and most flavored vinegars; canned gravy and mixes; regular condiments; salted snack foods, olives, picles, relish, horseradish sauce, catsup   Food preparation: Try these seasonings Meats:    Pork Sage, onion Serve with applesauce  Chicken Poultry seasoning, thyme, parsley Serve with cranberry sauce  Lamb Curry powder, rosemary, garlic, thyme Serve with mint sauce or jelly  Veal Marjoram, basil Serve with current jelly, cranberry sauce  Beef Pepper, bay leaf Serve with dry mustard, unsalted chive butter  [place]  leaf, dill Serve with unsalted lemon butter, unsalted parsley butter  Vegetables:    Asparagus Lemon juice   Broccoli Lemon juice   Carrots Mustard dressing parsley, mint, nutmeg, glazed with unsalted butter and sugar   Green beans Marjoram, lemon juice, nutmeg,dill seed   Tomatoes Basil, marjoram, onion   Spice /blend for [employer]" 4 tsp ground thyme 1 tsp ground sage 3 tsp ground rosemary 4 tsp ground marjoram   Test your knowledge A product that says "Salt Free" may still contain sodium. True or False Garlic Powder and Hot Pepper Sauce an be used as alternative seasonings.True or False Processed foods have more sodium than fresh foods.  True or False Canned Vegetables have less sodium than froze True or False   WAYS TO DECREASE YOUR SODIUM INTAKE Avoid the use of added salt in cooking and at the table.  Table salt (and other prepared seasonings which contain salt) is probably one of the greatest sources of sodium in the diet.  Unsalted foods can gain flavor from the sweet, sour, and butter taste sensations of herbs and spices.  Instead of using salt for seasoning, try the following seasonings with the foods listed.  Remember: how you use them to enhance natural food flavors is limited only by your creativity... Allspice-Meat, fish, eggs, fruit, peas, red and yellow vegetables Almond Extract-Fruit baked goods Anise Seed-Sweet breads, fruit, carrots, beets, cottage cheese, cookies (tastes like licorice)  Basil-Meat, fish, eggs, vegetables, rice, vegetables salads, soups, sauces Bay Leaf-Meat, fish, stews, poultry Burnet-Salad, vegetables (cucumber-like flavor) Caraway Seed-Bread, cookies, cottage cheese, meat, vegetables, cheese, rice Cardamon-Baked goods, fruit, soups Celery Powder or seed-Salads, salad dressings, sauces, meatloaf, soup, bread.Do not use  celery salt Chervil-Meats, salads, fish, eggs, vegetables, cottage cheese (parsley-like flavor) Chili Power-Meatloaf, chicken  cheese, corn, eggplant, egg dishes Chives-Salads cottage cheese, egg dishes, soups, vegetables, sauces Cilantro-Salsa, casseroles Cinnamon-Baked goods, fruit, pork, lamb, chicken, carrots Cloves-Fruit, baked goods, fish, pot roast, green beans, beets, carrots Coriander-Pastry, cookies, meat, salads, cheese (lemon-orange flavor) Cumin-Meatloaf, fish,cheese, eggs, cabbage,fruit pie (caraway flavor) [employer], fruit, eggs, fish, poultry, cottage cheese, vegetables Dill Seed-Meat, cottage cheese, poultry, vegetables, fish, salads, bread Fennel Seed-Bread, cookies, apples, pork, eggs, fish, beets, cabbage, cheese, Licorice-like flavor Garlic-(buds or powder) Salads, meat, poultry, fish, bread, butter, vegetables, potatoes.Do not  use garlic salt Ginger-Fruit, vegetables, baked goods, meat, fish, poultry Horseradish Root-Meet, vegetables, butter Lemon Juice or Extract-Vegetables, fruit, tea, baked goods, fish salads Mace-Baked goods fruit, vegetables, fish, poultry (taste like nutmeg) Maple Extract-Syrups Marjoram-Meat, chicken, fish, vegetables, breads, green salads (taste like Sage) Mint-Tea, lamb, sherbet, vegetables, desserts, carrots, cabbage Mustard, Dry or Seed-Cheese, eggs, meats, vegetables, poultry Nutmeg-Baked goods, fruit, chicken, eggs, vegetables, desserts Onion Powder-Meat, fish, poultry, vegetables, cheese, eggs, bread, rice salads (Do not use   Onion salt) Orange Extract-Desserts, baked goods Oregano-Pasta, eggs, cheese, onions, pork, lamb, fish, chicken, vegetables, green salads Paprika-Meat, fish, poultry, eggs, cheese, vegetables Parsley Flakes-Butter, vegetables, meat fish, poultry, eggs, bread, salads (certain forms may   Contain sodium Pepper-Meat fish, poultry, vegetables, eggs Peppermint Extract-Desserts, baked goods Poppy Seed-Eggs, bread, cheese, fruit dressings, baked goods, noodles, vegetables, cottage  [employer],  poultry, meat, fish, cauliflower, turnips,eggs bread Saffron-Rice, bread, veal, chicken, fish, eggs Sage-Meat, fish, poultry, onions, eggplant, tomateos, pork, stews Savory-Eggs, salads, poultry, meat, rice, vegetables, soups, pork Tarragon-Meat, poultry, fish, eggs, butter, vegetables (licorice-like flavor)  Thyme-Meat, poultry, fish, eggs, vegetables, (clover-like flavor), sauces, soups Tumeric-Salads, butter, eggs, fish, rice, vegetables (saffron-like flavor) Vanilla Extract-Baked goods, candy Vinegar-Salads, vegetables, meat marinades Walnut Extract-baked goods, candy   2. Choose your Foods Wisely   The following is a list of foods to avoid which are high in sodium:  Meats-Avoid all smoked, canned, salt cured, dried and kosher meat and fish as well as Anchovies   [employer] meats:Bologna, Liverwurst, Pastrami Canned meat or fish  Marinated herring Caviar    Pepperoni Corned Beef   Pizza Dried chipped beef  Salami Frozen breaded fish or meat Salt pork Frankfurters or hot dogs  Sardines Gefilte fish   Sausage Ham (boiled ham, Proscuitto Smoked butt    spiced ham)   Spam      TV Dinners Vegetables Canned vegetables (Regular) Relish Canned mushrooms  Sauerkraut Olives    Tomato juice Pickles  Bakery and Dessert Products Canned puddings  Cream pies Cheesecake   Decorated cakes Cookies  Beverages/Juices Tomato juice, regular  Gatorade   V-8 vegetable juice, regular  Breads and Cereals Biscuit mixes   Salted potato chips, corn chips, pretzels Bread stuffing mixes  Salted crackers and rolls Pancake and waffle mixes Self-rising flour  Seasonings Accent    Meat sauces Barbecue sauce  Meat tenderizer Catsup    Monosodium glutamate (MSG) Celery salt   Onion salt Chili sauce   Prepared mustard Garlic salt   Salt, seasoned salt, sea salt Gravy mixes   Soy sauce Horseradish   Steak sauce Ketchup  Tartar sauce Lite salt    Teriyaki sauce Marinade  mixes   Worcestershire sauce  Others Baking powder   Cocoa and cocoa mixes Baking soda   Commercial casserole mixes Candy-caramels, chocolate  Dehydrated soups    Bars, fudge,nougats  Instant rice and pasta mixes Canned broth or soup  Maraschino cherries Cheese, aged and processed cheese and cheese spreads  Learning Assessment Quiz  Indicated T (for True) or F (for False) for each of the following statements:  _____ Fresh fruits and vegetables and unprocessed grains are generally low in sodium _____ Water may contain a considerable amount of sodium, depending on the source _____ You can always tell if a food is high in sodium by tasting it _____ Certain laxatives my be high in sodium and should be avoided unless prescribed   by a physician or pharmacist _____ Salt substitutes may be used freely by anyone on a sodium restricted diet _____ Sodium is present in table salt, food additives and as a natural component of   most foods _____ Table salt is approximately 90% sodium _____ Limiting sodium intake may help prevent excess fluid accumulation in the body _____ On a sodium-restricted diet, seasonings such as bouillon soy sauce, and    cooking wine should be used in place of table salt _____ On an ingredient list, a product which lists monosodium glutamate as the first   ingredient is an appropriate food to include on a low sodium diet  Circle the best answer(s) to the following statements (Hint: there may be more than one correct answer)  11. On a low-sodium diet, some acceptable snack items are:    A. Olives  F. Bean dip   K. Grapefruit juice    B. Salted Pretzels G. Commercial Popcorn   L. Canned peaches    C. Carrot Sticks  H. Bouillon   M. Unsalted nuts   D. [place] fries  I. Peanut butter crackers N. Salami   E. Sweet pickles J. Tomato Juice   O. Pizza  12.  Seasonings that may be used freely on a reduced - sodium diet include   A. Lemon wedges F.Monosodium glutamate K.  Celery seed    B.Soysauce   G. Pepper   L. Mustard powder   C. Sea salt  H. Cooking wine  M. Onion flakes   D. Vinegar  E. Prepared horseradish N. Salsa   E. Sage   J. Worcestershire sauce  O. Chutney   Calorie Counting for Weight Loss- WEIGHT WATCHERS (Best Choice) Calories are units of energy. Your body needs a certain number of calories from food to keep going throughout the day. When you eat or drink more calories than your body needs, your body stores the extra calories mostly as fat. When you eat or drink fewer calories than your body needs, your body burns fat to getthe energy it needs. Calorie counting means keeping track of how many calories you eat and drink each day. Calorie counting can be helpful if you need to lose weight. If you eat fewer calories than your body needs, you should lose weight. Ask yourhealth care provider what a healthy weight is for you. For calorie counting to work, you will need to eat the right number of calories each day to lose a healthy amount of weight per week. A dietitian can help you figure out how many calories you need in a day and will suggest ways to reach your calorie goal. A healthy amount of weight to  lose each week is usually 1-2 lb (0.5-0.9 kg). This usually means that your daily calorie intake should be reduced by 500-750 calories. Eating 1,200-1,500 calories a day can help most women lose weight. Eating 1,500-1,800 calories a day can help most men lose weight. What do I need to know about calorie counting? Work with your health care provider or dietitian to determine how many calories you should get each day. To meet your daily calorie goal, you will need to: Find out how many calories are in each food that you would like to eat. Try to do this before you eat. Decide how much of the food you plan to eat. Keep a food log. Do this by writing down what you ate and how many calories it had. To successfully lose weight, it is important to balance  calorie counting with ahealthy lifestyle that includes regular activity. Where do I find calorie information?  The number of calories in a food can be found on a Nutrition Facts label. If a food does not have a Nutrition Facts label, try to look up the calories onlineor ask your dietitian for help. Remember that calories are listed per serving. If you choose to have more than one serving of a food, you will have to multiply the calories per serving by the number of servings you plan to eat. For example, the label on a package of bread might say that a serving size is 1 slice and that there are 90 calories in a serving. If you eat 1 slice, you will have eaten 90 calories. If you eat 2slices, you will have eaten 180 calories. How do I keep a food log? After each time that you eat, record the following in your food log as soon as possible: What you ate. Be sure to include toppings, sauces, and other extras on the food. How much you ate. This can be measured in cups, ounces, or number of items. How many calories were in each food and drink. The total number of calories in the food you ate. Keep your food log near you, such as in a pocket-sized notebook or on an app or website on your mobile phone. Some programs will calculate calories for you andshow you how many calories you have left to meet your daily goal. What are some portion-control tips? Know how many calories are in a serving. This will help you know how many servings you can have of a certain food. Use a measuring cup to measure serving sizes. You could also try weighing out portions on a kitchen scale. With time, you will be able to estimate serving sizes for some foods. Take time to put servings of different foods on your favorite plates or in your favorite bowls and cups so you know what a serving looks like. Try not to eat straight from a food's packaging, such as from a bag or box. Eating straight from the package makes it hard to see how  much you are eating and can lead to overeating. Put the amount you would like to eat in a cup or on a plate to make sure you are eating the right portion. Use smaller plates, glasses, and bowls for smaller portions and to prevent overeating. Try not to multitask. For example, avoid watching TV or using your computer while eating. If it is time to eat, sit down at a table and enjoy your food. This will help you recognize when you are full. It will also help you  be more mindful of what and how much you are eating. What are tips for following this plan? Reading food labels Check the calorie count compared with the serving size. The serving size may be smaller than what you are used to eating. Check the source of the calories. Try to choose foods that are high in protein, fiber, and vitamins, and low in saturated fat, trans fat, and sodium. Shopping Read nutrition labels while you shop. This will help you make healthy decisions about which foods to buy. Pay attention to nutrition labels for low-fat or fat-free foods. These foods sometimes have the same number of calories or more calories than the full-fat versions. They also often have added sugar, starch, or salt to make up for flavor that was removed with the fat. Make a grocery list of lower-calorie foods and stick to it. Cooking Try to cook your favorite foods in a healthier way. For example, try baking instead of frying. Use low-fat dairy products. Meal planning Use more fruits and vegetables. One-half of your plate should be fruits and vegetables. Include lean proteins, such as chicken, [place], and fish. Lifestyle Each week, aim to do one of the following: 150 minutes of moderate exercise, such as walking. 75 minutes of vigorous exercise, such as running. General information Know how many calories are in the foods you eat most often. This will help you calculate calorie counts faster. Find a way of tracking calories that works for you. Get  creative. Try different apps or programs if writing down calories does not work for you. What foods should I eat?  Eat nutritious foods. It is better to have a nutritious, high-calorie food, such as an avocado, than a food with few nutrients, such as a bag of potato chips. Use your calories on foods and drinks that will fill you up and will not leave you hungry soon after eating. Examples of foods that fill you up are nuts and nut butters, vegetables, lean proteins, and high-fiber foods such as whole grains. High-fiber foods are foods with more than 5 g of fiber per serving. Pay attention to calories in drinks. Low-calorie drinks include water and unsweetened drinks. The items listed above may not be a complete list of foods and beverages you can eat. Contact a dietitian for more information. What foods should I limit? Limit foods or drinks that are not good sources of vitamins, minerals, or protein or that are high in unhealthy fats. These include: Candy. Other sweets. Sodas, specialty coffee drinks, alcohol, and juice. The items listed above may not be a complete list of foods and beverages you should avoid. Contact a dietitian for more information. How do I count calories when eating out? Pay attention to portions. Often, portions are much larger when eating out. Try these tips to keep portions smaller: Consider sharing a meal instead of getting your own. If you get your own meal, eat only half of it. Before you start eating, ask for a container and put half of your meal into it. When available, consider ordering smaller portions from the menu instead of full portions. Pay attention to your food and drink choices. Knowing the way food is cooked and what is included with the meal can help you eat fewer calories. If calories are listed on the menu, choose the lower-calorie options. Choose dishes that include vegetables, fruits, whole grains, low-fat dairy products, and lean proteins. Choose  items that are boiled, broiled, grilled, or steamed. Avoid items that are buttered, battered,  fried, or served with cream sauce. Items labeled as crispy are usually fried, unless stated otherwise. Choose water, low-fat milk, unsweetened iced tea, or other drinks without added sugar. If you want an alcoholic beverage, choose a lower-calorie option, such as a glass of wine or light beer. Ask for dressings, sauces, and syrups on the side. These are usually high in calories, so you should limit the amount you eat. If you want a salad, choose a garden salad and ask for grilled meats. Avoid extra toppings such as bacon, cheese, or fried items. Ask for the dressing on the side, or ask for olive oil and vinegar or lemon to use as dressing. Estimate how many servings of a food you are given. Knowing serving sizes will help you be aware of how much food you are eating at restaurants. Where to find more information Centers for Disease Control and Prevention: http://www.wolf.info/ U.S. Department of Agriculture: http://www.wilson-mendoza.org/ Summary Calorie counting means keeping track of how many calories you eat and drink each day. If you eat fewer calories than your body needs, you should lose weight. A healthy amount of weight to lose per week is usually 1-2 lb (0.5-0.9 kg). This usually means reducing your daily calorie intake by 500-750 calories. The number of calories in a food can be found on a Nutrition Facts label. If a food does not have a Nutrition Facts label, try to look up the calories online or ask your dietitian for help. Use smaller plates, glasses, and bowls for smaller portions and to prevent overeating. Use your calories on foods and drinks that will fill you up and not leave you hungry shortly after a meal. This information is not intended to replace advice given to you by your health care provider. Make sure you discuss any questions you have with your healthcare provider. Document Revised: 03/09/2019 Document  Reviewed: 03/09/2019 Elsevier Patient Education  2022 [place].  Please keep blood pressure log for two weeks and call our office to update [place] on how your blood pressures are. REMEMBER to take your bp at the same time everyday, two hours after medications.

## 2020-07-29 NOTE — Addendum Note (Signed)
Addended by: Christella Scheuermann C on: 07/29/2020 01:54 PM   Modules accepted: Orders

## 2020-08-08 ENCOUNTER — Other Ambulatory Visit: Payer: Self-pay | Admitting: Family Medicine

## 2020-08-08 ENCOUNTER — Other Ambulatory Visit: Payer: Self-pay

## 2020-08-08 ENCOUNTER — Other Ambulatory Visit (HOSPITAL_COMMUNITY): Payer: Self-pay | Admitting: Family Medicine

## 2020-08-08 ENCOUNTER — Ambulatory Visit (HOSPITAL_COMMUNITY)
Admission: RE | Admit: 2020-08-08 | Discharge: 2020-08-08 | Disposition: A | Discharge status: Home / Self Care | Payer: Medicare Other | Source: Ambulatory Visit | Attending: Family Medicine | Admitting: Family Medicine

## 2020-08-08 DIAGNOSIS — I10 Essential (primary) hypertension: Secondary | ICD-10-CM | POA: Diagnosis not present

## 2020-08-08 DIAGNOSIS — E039 Hypothyroidism, unspecified: Secondary | ICD-10-CM | POA: Diagnosis not present

## 2020-08-08 DIAGNOSIS — Z0001 Encounter for general adult medical examination with abnormal findings: Secondary | ICD-10-CM | POA: Diagnosis not present

## 2020-08-08 DIAGNOSIS — M7989 Other specified soft tissue disorders: Secondary | ICD-10-CM | POA: Diagnosis not present

## 2020-08-08 DIAGNOSIS — I2510 Atherosclerotic heart disease of native coronary artery without angina pectoris: Secondary | ICD-10-CM | POA: Diagnosis not present

## 2020-08-08 DIAGNOSIS — E7849 Other hyperlipidemia: Secondary | ICD-10-CM | POA: Diagnosis not present

## 2020-08-08 DIAGNOSIS — E782 Mixed hyperlipidemia: Secondary | ICD-10-CM | POA: Diagnosis not present

## 2020-08-08 DIAGNOSIS — R7309 Other abnormal glucose: Secondary | ICD-10-CM | POA: Diagnosis not present

## 2020-08-08 DIAGNOSIS — R600 Localized edema: Secondary | ICD-10-CM | POA: Diagnosis not present

## 2020-08-08 DIAGNOSIS — S8991XA Unspecified injury of right lower leg, initial encounter: Secondary | ICD-10-CM | POA: Diagnosis not present

## 2020-08-08 DIAGNOSIS — G4733 Obstructive sleep apnea (adult) (pediatric): Secondary | ICD-10-CM | POA: Diagnosis not present

## 2020-08-08 DIAGNOSIS — Z1389 Encounter for screening for other disorder: Secondary | ICD-10-CM | POA: Diagnosis not present

## 2020-09-08 DIAGNOSIS — I10 Essential (primary) hypertension: Secondary | ICD-10-CM | POA: Diagnosis not present

## 2020-09-08 DIAGNOSIS — E782 Mixed hyperlipidemia: Secondary | ICD-10-CM | POA: Diagnosis not present

## 2020-09-08 DIAGNOSIS — I2510 Atherosclerotic heart disease of native coronary artery without angina pectoris: Secondary | ICD-10-CM | POA: Diagnosis not present

## 2020-09-20 DIAGNOSIS — G4733 Obstructive sleep apnea (adult) (pediatric): Secondary | ICD-10-CM | POA: Diagnosis not present

## 2020-10-21 DIAGNOSIS — E039 Hypothyroidism, unspecified: Secondary | ICD-10-CM | POA: Diagnosis not present

## 2020-11-04 DIAGNOSIS — D225 Melanocytic nevi of trunk: Secondary | ICD-10-CM | POA: Diagnosis not present

## 2020-11-04 DIAGNOSIS — X32XXXD Exposure to sunlight, subsequent encounter: Secondary | ICD-10-CM | POA: Diagnosis not present

## 2020-11-04 DIAGNOSIS — L570 Actinic keratosis: Secondary | ICD-10-CM | POA: Diagnosis not present

## 2020-11-04 DIAGNOSIS — Z1283 Encounter for screening for malignant neoplasm of skin: Secondary | ICD-10-CM | POA: Diagnosis not present

## 2020-11-16 ENCOUNTER — Other Ambulatory Visit: Payer: Self-pay | Admitting: Cardiology

## 2020-12-13 ENCOUNTER — Other Ambulatory Visit: Payer: Self-pay

## 2020-12-13 ENCOUNTER — Ambulatory Visit: Payer: Medicare Other | Admitting: Cardiology

## 2020-12-13 ENCOUNTER — Encounter: Payer: Self-pay | Admitting: Cardiology

## 2020-12-13 VITALS — BP 152/82 | HR 56 | Ht 66.0 in | Wt 223.4 lb

## 2020-12-13 DIAGNOSIS — E782 Mixed hyperlipidemia: Secondary | ICD-10-CM | POA: Diagnosis not present

## 2020-12-13 DIAGNOSIS — I10 Essential (primary) hypertension: Secondary | ICD-10-CM

## 2020-12-13 DIAGNOSIS — I2510 Atherosclerotic heart disease of native coronary artery without angina pectoris: Secondary | ICD-10-CM | POA: Diagnosis not present

## 2020-12-13 NOTE — Progress Notes (Signed)
Clinical Summary [name] is a 75 y.o.male seen today for follow up of the following medical problems.    1. CAD - s/p cath in 10/2017 which showed eccentric stenosis along the proximal LAD which appeared to have 30% stenosis except RAO cranial view and appeared to have 75% stenosis with discordant mildly positive DFR and negative FFR and medical management initially recommended given large diagonal [name])   09/2019 cath: 80% prox LAD, mod RCA nonobstructive. Received DES to LAD - 09/2019 echo LVEF severe septal hypertrophy, LVEF 65-70%, no WMAs, grade II DDx. No dynamic gradient reported   - no recent chest pains. Some SOB at times   2. HTN - has not taken meds yet todya       3. Hyperlipidemia - 09/2019 TC 113 TG 92 HDL 42 LDL 54 - labs followed by pcp - he is on crestor   4. 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 OSA - using cpap machine.  - followed by Dr Brett Fairy  Past Medical History:  Diagnosis Date   CAD (coronary artery disease)    a. s/p cath in 10/2017 which showed eccentric stenosis along the proximal LAD which appeared to have 30% stenosis except RAO cranial view and appeared to have 75% stenosis with discordant mildly positive DFR and negative FFR and medical management initially recommended given large diagonal [name]   Cancer ([place]) 07/2012   prostate   Heart disease    Hypertension      Allergies  Allergen Reactions   Penicillins     Eye swelled shut  .Has patient had a PCN reaction causing immediate rash, facial/tongue/throat swelling, SOB or lightheadedness with hypotension: Yes Has patient had a PCN reaction causing severe rash involving mucus membranes or skin necrosis: No Has patient had a PCN reaction that required hospitalization: No Has patient had a PCN reaction occurring within the last 10 years: No If all of the above answers are "NO", then may  proceed with Cephalosporin use     Current Outpatient Medications  Medication Sig Dispense Refill   amLODipine (NORVASC) 10 MG tablet Take 1 tablet (10 mg total) by mouth daily. 90 tablet 1   aspirin 81 MG tablet Take 81 mg by mouth daily.     clopidogrel (PLAVIX) 75 MG tablet Take 1 tablet (75 mg total) by mouth daily with breakfast. 90 tablet 3   hydrochlorothiazide (MICROZIDE) 12.5 MG capsule Take 1 capsule (12.5 mg total) by mouth daily. 90 capsule 3   losartan (COZAAR) 100 MG tablet Take 1 tablet by mouth once daily 90 tablet 2   metoprolol succinate (TOPROL-XL) 25 MG 24 hr tablet Take 0.5 tablets (12.5 mg total) by mouth daily. 90 tablet 3   rosuvastatin (CRESTOR) 5 MG tablet Take 1 tablet (5 mg total) by mouth daily. 90 tablet 3   No current facility-administered medications for this visit.     Past Surgical History:  Procedure Laterality Date   COLONOSCOPY N/A 08/28/2014   Procedure: COLONOSCOPY;  Surgeon: Aviva Signs, MD;  [facility];  Service: Gastroenterology;  Laterality: N/A;   CORONARY STENT INTERVENTION N/A 10/02/2019   Procedure: CORONARY STENT INTERVENTION;  Surgeon: Nelva Bush, MD;  Location: [place] [facility];  Service: Cardiovascular;  Laterality: N/A;   INTRAVASCULAR PRESSURE WIRE/FFR STUDY N/A 11/04/2017   Procedure: INTRAVASCULAR PRESSURE WIRE/DFR vs FFR  STUDY;  Surgeon: Troy Sine, MD;  Location: [place] [facility];  Service: Cardiovascular;  Laterality: N/A;   INTRAVASCULAR PRESSURE WIRE/FFR STUDY N/A 10/02/2019   Procedure: INTRAVASCULAR PRESSURE WIRE/FFR STUDY;  Surgeon: Nelva Bush, MD;  Location: [place] [facility];  Service: Cardiovascular;  Laterality: N/A;   LEFT HEART CATH AND CORONARY ANGIOGRAPHY N/A 11/04/2017   Procedure: LEFT HEART CATH AND CORONARY ANGIOGRAPHY;  Surgeon: Troy Sine, MD;  Location: [place] [facility];  Service: Cardiovascular;  Laterality: N/A;   LEFT HEART CATH AND CORONARY ANGIOGRAPHY N/A 10/02/2019    Procedure: LEFT HEART CATH AND CORONARY ANGIOGRAPHY;  Surgeon: Nelva Bush, MD;  Location: [place] [facility];  Service: Cardiovascular;  Laterality: N/A;   PROSTATE SURGERY  12/2012   SHOULDER SURGERY Bilateral      Allergies  Allergen Reactions   Penicillins     Eye swelled shut  .Has patient had a PCN reaction causing immediate rash, facial/tongue/throat swelling, SOB or lightheadedness with hypotension: Yes Has patient had a PCN reaction causing severe rash involving mucus membranes or skin necrosis: No Has patient had a PCN reaction that required hospitalization: No Has patient had a PCN reaction occurring within the last 10 years: No If all of the above answers are "NO", then may proceed with Cephalosporin use      Family History  Problem Relation Age of Onset   Stroke Mother    Heart attack Father    Stroke Father    Kidney disease Brother      Social History [name] reports that he quit smoking about 44 years ago. His smoking use included cigarettes. He has a 4.50 pack-year smoking history. He quit smokeless tobacco use about 41 years ago. [name] reports no history of alcohol use.   Review of Systems CONSTITUTIONAL: No weight loss, fever, chills, weakness or fatigue.  HEENT: Eyes: No visual loss, blurred vision, double vision or yellow sclerae.No hearing loss, sneezing, congestion, runny nose or sore throat.  SKIN: No rash or itching.  CARDIOVASCULAR: per hpi RESPIRATORY: No shortness of breath, cough or sputum.  GASTROINTESTINAL: No anorexia, nausea, vomiting or diarrhea. No abdominal pain or blood.  GENITOURINARY: No burning on urination, no polyuria NEUROLOGICAL: No headache, dizziness, syncope, paralysis, ataxia, numbness or tingling in the extremities. No change in bowel or bladder control.  MUSCULOSKELETAL: No muscle, back pain, joint pain or stiffness.  LYMPHATICS: No enlarged nodes. No history of splenectomy.  PSYCHIATRIC: No history of  depression or anxiety.  ENDOCRINOLOGIC: No reports of sweating, cold or heat intolerance. No polyuria or polydipsia.  Marland Kitchen   Physical Examination Today's Vitals   12/13/20 0926  BP: (!) 152/82  Pulse: (!) 56  SpO2: 95%  Weight: 223 lb 6.4 oz (101.3 kg)  Height: 5\' 6"  (1.676 m)   Body mass index is 36.06 kg/m.  Gen: resting comfortably, no acute distress HEENT: no scleral icterus, pupils equal round and reactive, no palptable cervical adenopathy,  CV: RRR, no m/r/ gno jvd Resp: Clear to auscultation bilaterally GI: abdomen is soft, non-tender, non-distended, normal bowel sounds, no hepatosplenomegaly MSK: extremities are warm, no edema.  Skin: warm, no rash Neuro:  no focal deficits Psych: appropriate affect   Diagnostic Studies  Echocardiogram: 10/2017 Study Conclusions   - Left ventricle: The cavity size was normal. Systolic function was    vigorous. The estimated ejection fraction was in the range of 65%    to 70%. Wall motion was normal; there were no regional wall    motion abnormalities. Features are  consistent with a pseudonormal    left ventricular filling pattern, with concomitant abnormal    relaxation and increased filling pressure (grade 2 diastolic    dysfunction). Doppler parameters are consistent with high    ventricular filling pressure. Mild to moderate posterior wall and    severe proximal septal hypertrophy.  - Aortic valve: Mildly calcified annulus. Trileaflet. There was    mild regurgitation.  - Mitral valve: Mildly calcified annulus.  - Left atrium: The atrium was mildly dilated.  - Atrial septum: No defect or patent foramen ovale was identified.      Cardiac Catheterization: 10/2017 Ost RCA to Prox RCA lesion is 50% stenosed. Prox RCA lesion is 20% stenosed. Post Atrio lesion is 20% stenosed. Prox LAD lesion is 75% stenosed. Prox Cx to Mid Cx lesion is 10% stenosed. 1st Mrg lesion is 20% stenosed. The left ventricular systolic function is  normal. LV end diastolic pressure is normal. The left ventricular ejection fraction is 55-65% by visual estimate.   The proximal LAD has an eccentric stenosis in the region of a large 4 Benoit Meech diagonal vessel which in most views does not appear significantly narrowed but in the RAO cranial view appears at least 75%.  FFR and DFR were discordant with mild positivity on DFR and negative FFR.    Mild nonobstructive 20% circumflex stenoses.   Probable proximal RCA spasm with narrowing up to 50% and 20% proximal and distal RCA stenoses.   Normal LV function with an ejection fraction of 65% without focal segmental wall motion abnormalities.   RECOMMENDATION: Since the patient was not on any prior anti-ischemic medications into his hospitalization, in light of the discordant flow data and large for Shaquelle Hernon diagonal vessel, the initial plan will be to attempt aggressive medical therapy with high potency statin therapy, addition of amlodipine, nitrates and beta-blocker.  Will discontinue HCTZ.  Consider PCI if patient continues to experience recurrent symptomatology on a good medical regimen.   Recommend Aspirin 81mg  daily for moderate CAD.   Assessment and Plan   1. CAD - no recent symptoms - over a year since stent, can d/c plavix   2. HTN - elevated but has not taken all his bp meds yet today - he will call us with home bp's next week   3. Hyperlipidemia - has been at goal. Request labs from pcp, continue crestor   4. Hypertrophic obstructive cardiomyopathy - no symptoms - at 74 no strong indication for ICD risk stratification as time alone has shown he is not high risk     Arnoldo Lenis, M.D.

## 2020-12-13 NOTE — Patient Instructions (Signed)
Medication Instructions:  Your physician has recommended you make the following change in your medication:  Stop Plavix  *If you need a refill on your cardiac medications before your next appointment, please call your pharmacy*   Lab Work: We have requested your lab work from your primary care provider  If you have labs (blood work) drawn today and your tests are completely normal, you will receive your results only by: [place] (if you have MyChart) OR A paper copy in the mail If you have any lab test that is abnormal or we need to change your treatment, we will call you to review the results.   Testing/Procedures: None   Follow-Up: At [facility], you and your health needs are our priority.  As part of our continuing mission to provide you with exceptional heart care, we have created designated Provider Care Teams.  These Care Teams include your primary Cardiologist (physician) and Advanced Practice Providers (APPs -  Physician Assistants and Nurse Practitioners) who all work together to provide you with the care you need, when you need it.  We recommend signing up for the patient portal called "MyChart".  Sign up information is provided on this After Visit Summary.  MyChart is used to connect with patients for Virtual Visits (Telemedicine).  Patients are able to view lab/test results, encounter notes, upcoming appointments, etc.  Non-urgent messages can be sent to your provider as well.   To learn more about what you can do with MyChart, go to [email or web address].    Your next appointment:   6 month(s)  The format for your next appointment:   In Person  Provider:   Carlyle Dolly, MD    Other Instructions Please update our office on Monday with blood pressures

## 2021-01-13 DIAGNOSIS — E782 Mixed hyperlipidemia: Secondary | ICD-10-CM | POA: Diagnosis not present

## 2021-01-13 DIAGNOSIS — E7849 Other hyperlipidemia: Secondary | ICD-10-CM | POA: Diagnosis not present

## 2021-01-13 DIAGNOSIS — I2510 Atherosclerotic heart disease of native coronary artery without angina pectoris: Secondary | ICD-10-CM | POA: Diagnosis not present

## 2021-01-13 DIAGNOSIS — E039 Hypothyroidism, unspecified: Secondary | ICD-10-CM | POA: Diagnosis not present

## 2021-01-13 DIAGNOSIS — R7309 Other abnormal glucose: Secondary | ICD-10-CM | POA: Diagnosis not present

## 2021-01-13 DIAGNOSIS — G4733 Obstructive sleep apnea (adult) (pediatric): Secondary | ICD-10-CM | POA: Diagnosis not present

## 2021-02-07 DIAGNOSIS — E782 Mixed hyperlipidemia: Secondary | ICD-10-CM | POA: Diagnosis not present

## 2021-02-07 DIAGNOSIS — I10 Essential (primary) hypertension: Secondary | ICD-10-CM | POA: Diagnosis not present

## 2021-02-07 DIAGNOSIS — I2510 Atherosclerotic heart disease of native coronary artery without angina pectoris: Secondary | ICD-10-CM | POA: Diagnosis not present

## 2021-06-27 ENCOUNTER — Other Ambulatory Visit: Payer: Self-pay | Admitting: Cardiology

## 2021-08-07 ENCOUNTER — Other Ambulatory Visit: Payer: Self-pay | Admitting: Cardiology

## 2021-09-15 ENCOUNTER — Other Ambulatory Visit: Payer: Self-pay | Admitting: Cardiology

## 2021-09-16 ENCOUNTER — Ambulatory Visit: Payer: Medicare Other | Admitting: Cardiology

## 2021-09-16 ENCOUNTER — Other Ambulatory Visit: Payer: Self-pay | Admitting: Cardiology

## 2021-09-16 ENCOUNTER — Encounter: Payer: Self-pay | Admitting: Cardiology

## 2021-09-16 VITALS — BP 190/86 | HR 73 | Wt 222.0 lb

## 2021-09-16 DIAGNOSIS — E782 Mixed hyperlipidemia: Secondary | ICD-10-CM

## 2021-09-16 DIAGNOSIS — R0989 Other specified symptoms and signs involving the circulatory and respiratory systems: Secondary | ICD-10-CM | POA: Diagnosis not present

## 2021-09-16 DIAGNOSIS — I25118 Atherosclerotic heart disease of native coronary artery with other forms of angina pectoris: Secondary | ICD-10-CM | POA: Diagnosis not present

## 2021-09-16 DIAGNOSIS — I10 Essential (primary) hypertension: Secondary | ICD-10-CM

## 2021-09-16 NOTE — Addendum Note (Signed)
Addended by: Berlinda Last on: 09/16/2021 09:49 AM   Modules accepted: Orders

## 2021-09-16 NOTE — Patient Instructions (Signed)
Medication Instructions:  Your physician recommends that you continue on your current medications as directed. Please refer to the Current Medication list given to you today.   Labwork: None  Testing/Procedures: Your physician has requested that you have a carotid duplex. This test is an ultrasound of the carotid arteries in your neck. It looks at blood flow through these arteries that supply the brain with blood. Allow one hour for this exam. There are no restrictions or special instructions.   Follow-Up: Follow up with Dr. Harl Bowie in 6 months.  Any Other Special Instructions Will Be Listed Below (If Applicable).  Please update our office on your home blood pressure readings: FRIDAY September 19, 2021.   If you need a refill on your cardiac medications before your next appointment, please call your pharmacy.

## 2021-09-16 NOTE — Progress Notes (Signed)
Clinical Summary [name] is a 76 y.o.male seen today for follow up of the following medical problems.    1. CAD - s/p cath in 10/2017 which showed eccentric stenosis along the proximal LAD which appeared to have 30% stenosis except RAO cranial view and appeared to have 75% stenosis with discordant mildly positive DFR and negative FFR and medical management initially recommended given large diagonal Demontae Antunes)   09/2019 cath: 80% prox LAD, mod RCA nonobstructive. Received DES to LAD - 09/2019 echo LVEF severe septal hypertrophy, LVEF 65-70%, no WMAs, grade II DDx. No dynamic gradient reported   - no chest pains, no SOB/DOE - compliant with meds   2. HTN - compliant with meds - just took losartan this AM, has not taken other bp meds yet.      3. Hyperlipidemia - 09/2019 TC 113 TG 92 HDL 42 LDL 54 - labs followed by pcp - he is on crestor  07/2020 TC 128 TG 104 HDL 43 LDL 66   4. 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 OSA - using cpap machine, mixed compliance - followed by Dr Brett Fairy    SH: wife has large humming birds garden    Past Medical History:  Diagnosis Date   CAD (coronary artery disease)    a. s/p cath in 10/2017 which showed eccentric stenosis along the proximal LAD which appeared to have 30% stenosis except RAO cranial view and appeared to have 75% stenosis with discordant mildly positive DFR and negative FFR and medical management initially recommended given large diagonal Izacc Demeyer   Cancer ([place]) 07/2012   prostate   Heart disease    Hypertension      Allergies  Allergen Reactions   Penicillins     Eye swelled shut  .Has patient had a PCN reaction causing immediate rash, facial/tongue/throat swelling, SOB or lightheadedness with hypotension: Yes Has patient had a PCN reaction causing severe rash involving mucus membranes or skin necrosis: No Has patient had a PCN  reaction that required hospitalization: No Has patient had a PCN reaction occurring within the last 10 years: No If all of the above answers are "NO", then may proceed with Cephalosporin use     Current Outpatient Medications  Medication Sig Dispense Refill   amLODipine (NORVASC) 10 MG tablet Take 1 tablet (10 mg total) by mouth daily. 90 tablet 1   aspirin 81 MG tablet Take 81 mg by mouth daily.     hydrochlorothiazide (MICROZIDE) 12.5 MG capsule Take 1 capsule by mouth once daily 30 capsule 0   losartan (COZAAR) 100 MG tablet Take 1 tablet by mouth once daily 90 tablet 2   metoprolol succinate (TOPROL-XL) 25 MG 24 hr tablet Take 0.5 tablets (12.5 mg total) by mouth daily. 90 tablet 3   rosuvastatin (CRESTOR) 5 MG tablet Take 1 tablet by mouth once daily 90 tablet 0   No current facility-administered medications for this visit.     Past Surgical History:  Procedure Laterality Date   COLONOSCOPY N/A 08/28/2014   Procedure: COLONOSCOPY;  Surgeon: Aviva Signs, MD;  [facility];  Service: Gastroenterology;  Laterality: N/A;   CORONARY STENT INTERVENTION N/A 10/02/2019   Procedure: CORONARY STENT INTERVENTION;  Surgeon: Nelva Bush, MD;  Location: [place] [facility];  Service: Cardiovascular;  Laterality: N/A;   INTRAVASCULAR PRESSURE WIRE/FFR STUDY N/A 11/04/2017   Procedure: INTRAVASCULAR PRESSURE WIRE/DFR vs FFR  STUDY;  Surgeon: Troy Sine, MD;  Location: [place] [facility];  Service: Cardiovascular;  Laterality: N/A;   INTRAVASCULAR PRESSURE WIRE/FFR STUDY N/A 10/02/2019   Procedure: INTRAVASCULAR PRESSURE WIRE/FFR STUDY;  Surgeon: Nelva Bush, MD;  Location: [place] [facility];  Service: Cardiovascular;  Laterality: N/A;   LEFT HEART CATH AND CORONARY ANGIOGRAPHY N/A 11/04/2017   Procedure: LEFT HEART CATH AND CORONARY ANGIOGRAPHY;  Surgeon: Troy Sine, MD;  Location: [place] [facility];  Service: Cardiovascular;  Laterality: N/A;   LEFT HEART CATH AND  CORONARY ANGIOGRAPHY N/A 10/02/2019   Procedure: LEFT HEART CATH AND CORONARY ANGIOGRAPHY;  Surgeon: Nelva Bush, MD;  Location: [place] [facility];  Service: Cardiovascular;  Laterality: N/A;   PROSTATE SURGERY  12/2012   SHOULDER SURGERY Bilateral      Allergies  Allergen Reactions   Penicillins     Eye swelled shut  .Has patient had a PCN reaction causing immediate rash, facial/tongue/throat swelling, SOB or lightheadedness with hypotension: Yes Has patient had a PCN reaction causing severe rash involving mucus membranes or skin necrosis: No Has patient had a PCN reaction that required hospitalization: No Has patient had a PCN reaction occurring within the last 10 years: No If all of the above answers are "NO", then may proceed with Cephalosporin use      Family History  Problem Relation Age of Onset   Stroke Mother    Heart attack Father    Stroke Father    Kidney disease Brother      Social History Mr. Sadowsky reports that he quit smoking about 45 years ago. His smoking use included cigarettes. He has a 4.50 pack-year smoking history. He quit smokeless tobacco use about 42 years ago. Mr. Balling reports no history of alcohol use.   Review of Systems CONSTITUTIONAL: No weight loss, fever, chills, weakness or fatigue.  HEENT: Eyes: No visual loss, blurred vision, double vision or yellow sclerae.No hearing loss, sneezing, congestion, runny nose or sore throat.  SKIN: No rash or itching.  CARDIOVASCULAR: per hpi RESPIRATORY: No shortness of breath, cough or sputum.  GASTROINTESTINAL: No anorexia, nausea, vomiting or diarrhea. No abdominal pain or blood.  GENITOURINARY: No burning on urination, no polyuria NEUROLOGICAL: No headache, dizziness, syncope, paralysis, ataxia, numbness or tingling in the extremities. No change in bowel or bladder control.  MUSCULOSKELETAL: No muscle, back pain, joint pain or stiffness.  LYMPHATICS: No enlarged nodes. No history of  splenectomy.  PSYCHIATRIC: No history of depression or anxiety.  ENDOCRINOLOGIC: No reports of sweating, cold or heat intolerance. No polyuria or polydipsia.  Marland Kitchen   Physical Examination Today's Vitals   09/16/21 0921  BP: (!) 190/86  Pulse: 73  SpO2: 94%  Weight: 222 lb (100.7 kg)   Body mass index is 35.83 kg/m.  Gen: resting comfortably, no acute distress HEENT: no scleral icterus, pupils equal round and reactive, no palptable cervical adenopathy,  CV: RRR, 2/6 systolic murmur rusb, no jvd. +bilateral carotid bruits Resp: Clear to auscultation bilaterally GI: abdomen is soft, non-tender, non-distended, normal bowel sounds, no hepatosplenomegaly MSK: extremities are warm, no edema.  Skin: warm, no rash Neuro:  no focal deficits Psych: appropriate affect   Diagnostic Studies Echocardiogram: 10/2017 Study Conclusions   - Left ventricle: The cavity size was normal. Systolic function was    vigorous. The estimated ejection fraction was in the range of 65%    to 70%. Wall motion was normal; there were no regional wall    motion  abnormalities. Features are consistent with a pseudonormal    left ventricular filling pattern, with concomitant abnormal    relaxation and increased filling pressure (grade 2 diastolic    dysfunction). Doppler parameters are consistent with high    ventricular filling pressure. Mild to moderate posterior wall and    severe proximal septal hypertrophy.  - Aortic valve: Mildly calcified annulus. Trileaflet. There was    mild regurgitation.  - Mitral valve: Mildly calcified annulus.  - Left atrium: The atrium was mildly dilated.  - Atrial septum: No defect or patent foramen ovale was identified.      Cardiac Catheterization: 10/2017 Ost RCA to Prox RCA lesion is 50% stenosed. Prox RCA lesion is 20% stenosed. Post Atrio lesion is 20% stenosed. Prox LAD lesion is 75% stenosed. Prox Cx to Mid Cx lesion is 10% stenosed. 1st Mrg lesion is 20%  stenosed. The left ventricular systolic function is normal. LV end diastolic pressure is normal. The left ventricular ejection fraction is 55-65% by visual estimate.   The proximal LAD has an eccentric stenosis in the region of a large 4 Aleena Kirkeby diagonal vessel which in most views does not appear significantly narrowed but in the RAO cranial view appears at least 75%.  FFR and DFR were discordant with mild positivity on DFR and negative FFR.    Mild nonobstructive 20% circumflex stenoses.   Probable proximal RCA spasm with narrowing up to 50% and 20% proximal and distal RCA stenoses.   Normal LV function with an ejection fraction of 65% without focal segmental wall motion abnormalities.   RECOMMENDATION: Since the patient was not on any prior anti-ischemic medications into his hospitalization, in light of the discordant flow data and large for Aqueelah Cotrell diagonal vessel, the initial plan will be to attempt aggressive medical therapy with high potency statin therapy, addition of amlodipine, nitrates and beta-blocker.  Will discontinue HCTZ.  Consider PCI if patient continues to experience recurrent symptomatology on a good medical regimen.   Recommend Aspirin '81mg'$  daily for moderate CAD.    Assessment and Plan   1. CAD - no symptoms, continue current meds - EKG SR, chronic ST/T changes   2. HTN - elevated but has not taken all his bp meds yet today - he will call us Friday with home bp's. Likely would change HCTZ to chlorthalidone if needed   3. Hyperlipidemia - labs followed by pcp, has been at goal -continue current meds   4. Hypertrophic obstructive cardiomyopathy - denies symptoms - at 76 no strong indication for ICD risk stratification as time alone has shown he is not high risk     Arnoldo Lenis, M.D.

## 2021-09-22 ENCOUNTER — Ambulatory Visit (INDEPENDENT_AMBULATORY_CARE_PROVIDER_SITE_OTHER): Payer: Medicare Other

## 2021-09-22 DIAGNOSIS — R0989 Other specified symptoms and signs involving the circulatory and respiratory systems: Secondary | ICD-10-CM

## 2021-09-24 DIAGNOSIS — G4733 Obstructive sleep apnea (adult) (pediatric): Secondary | ICD-10-CM | POA: Diagnosis not present

## 2021-09-24 DIAGNOSIS — R7309 Other abnormal glucose: Secondary | ICD-10-CM | POA: Diagnosis not present

## 2021-09-24 DIAGNOSIS — E782 Mixed hyperlipidemia: Secondary | ICD-10-CM | POA: Diagnosis not present

## 2021-09-24 DIAGNOSIS — I2510 Atherosclerotic heart disease of native coronary artery without angina pectoris: Secondary | ICD-10-CM | POA: Diagnosis not present

## 2021-09-24 DIAGNOSIS — E039 Hypothyroidism, unspecified: Secondary | ICD-10-CM | POA: Diagnosis not present

## 2021-09-24 DIAGNOSIS — E7849 Other hyperlipidemia: Secondary | ICD-10-CM | POA: Diagnosis not present

## 2021-09-24 DIAGNOSIS — Z0001 Encounter for general adult medical examination with abnormal findings: Secondary | ICD-10-CM | POA: Diagnosis not present

## 2021-10-02 ENCOUNTER — Encounter: Payer: Self-pay | Admitting: *Deleted

## 2022-01-19 ENCOUNTER — Other Ambulatory Visit: Payer: Self-pay

## 2022-01-19 MED ORDER — METOPROLOL SUCCINATE ER 25 MG PO TB24: 12.5000 mg | ORAL_TABLET | Freq: Every day | ORAL | 3 refills | Status: AC

## 2022-03-25 ENCOUNTER — Ambulatory Visit: Payer: Medicare Other | Attending: Cardiology | Admitting: Cardiology

## 2022-03-25 ENCOUNTER — Encounter: Payer: Self-pay | Admitting: Cardiology

## 2022-03-25 VITALS — BP 150/90 | HR 66 | Ht 66.0 in | Wt 224.0 lb

## 2022-03-25 DIAGNOSIS — E782 Mixed hyperlipidemia: Secondary | ICD-10-CM | POA: Diagnosis not present

## 2022-03-25 DIAGNOSIS — I25118 Atherosclerotic heart disease of native coronary artery with other forms of angina pectoris: Secondary | ICD-10-CM

## 2022-03-25 DIAGNOSIS — I10 Essential (primary) hypertension: Secondary | ICD-10-CM | POA: Diagnosis not present

## 2022-03-25 NOTE — Progress Notes (Signed)
Clinical Summary [name] is a 77 y.o.male seen today for follow up of the following medical problems.    1. CAD - s/p cath in 10/2017 which showed eccentric stenosis along the proximal LAD which appeared to have 30% stenosis except RAO cranial view and appeared to have 75% stenosis with discordant mildly positive DFR and negative FFR and medical management initially recommended given large diagonal [name])   09/2019 cath: 80% prox LAD, mod RCA nonobstructive. Received DES to LAD - 09/2019 echo LVEF severe septal hypertrophy, LVEF 65-70%, no WMAs, grade II DDx. No dynamic gradient reported   - no chest pains, no SOB/DOE - compliant with meds    2. HTN - he is compliant with meds      3. Hyperlipidemia - 09/2019 TC 113 TG 92 HDL 42 LDL 54 - labs followed by pcp - he is on crestor   07/2020 TC 128 TG 104 HDL 43 LDL 66 - 09/2021 TC 127 TG 105 HDL 45 LDL 63   4. 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 OSA - using cpap machine, mixed compliance - followed by Dr Brett Fairy     SH: wife has large humming birds garden Past Medical History:  Diagnosis Date   CAD (coronary artery disease)    a. s/p cath in 10/2017 which showed eccentric stenosis along the proximal LAD which appeared to have 30% stenosis except RAO cranial view and appeared to have 75% stenosis with discordant mildly positive DFR and negative FFR and medical management initially recommended given large diagonal [name]   Cancer ([employer]) 07/2012   prostate   Heart disease    Hypertension      Allergies  Allergen Reactions   Penicillins     Eye swelled shut  .Has patient had a PCN reaction causing immediate rash, facial/tongue/throat swelling, SOB or lightheadedness with hypotension: Yes Has patient had a PCN reaction causing severe rash involving mucus membranes or skin necrosis: No Has patient had a PCN reaction that  required hospitalization: No Has patient had a PCN reaction occurring within the last 10 years: No If all of the above answers are "NO", then may proceed with Cephalosporin use     Current Outpatient Medications  Medication Sig Dispense Refill   amLODipine (NORVASC) 10 MG tablet Take 1 tablet (10 mg total) by mouth daily. 90 tablet 1   aspirin 81 MG tablet Take 81 mg by mouth daily.     hydrochlorothiazide (MICROZIDE) 12.5 MG capsule Take 1 capsule by mouth once daily 90 capsule 3   losartan (COZAAR) 100 MG tablet Take 1 tablet by mouth once daily 90 tablet 2   metoprolol succinate (TOPROL-XL) 25 MG 24 hr tablet Take 0.5 tablets (12.5 mg total) by mouth daily. 45 tablet 3   rosuvastatin (CRESTOR) 5 MG tablet Take 1 tablet by mouth once daily 90 tablet 0   No current facility-administered medications for this visit.     Past Surgical History:  Procedure Laterality Date   COLONOSCOPY N/A 08/28/2014   Procedure: COLONOSCOPY;  Surgeon: Aviva Signs, MD;  [facility];  Service: Gastroenterology;  Laterality: N/A;   CORONARY STENT INTERVENTION N/A 10/02/2019   Procedure: CORONARY STENT INTERVENTION;  Surgeon: Nelva Bush, MD;  Location: [place] [facility];  Service: Cardiovascular;  Laterality: N/A;   INTRAVASCULAR PRESSURE WIRE/FFR STUDY N/A 11/04/2017   Procedure: INTRAVASCULAR PRESSURE WIRE/DFR vs FFR  STUDY;  Surgeon: Troy Sine, MD;  Location: [place] [facility];  Service: Cardiovascular;  Laterality: N/A;   INTRAVASCULAR PRESSURE WIRE/FFR STUDY N/A 10/02/2019   Procedure: INTRAVASCULAR PRESSURE WIRE/FFR STUDY;  Surgeon: Nelva Bush, MD;  Location: [place] [facility];  Service: Cardiovascular;  Laterality: N/A;   LEFT HEART CATH AND CORONARY ANGIOGRAPHY N/A 11/04/2017   Procedure: LEFT HEART CATH AND CORONARY ANGIOGRAPHY;  Surgeon: Troy Sine, MD;  Location: [place] [facility];  Service: Cardiovascular;  Laterality: N/A;   LEFT HEART CATH AND CORONARY  ANGIOGRAPHY N/A 10/02/2019   Procedure: LEFT HEART CATH AND CORONARY ANGIOGRAPHY;  Surgeon: Nelva Bush, MD;  Location: [place] [facility];  Service: Cardiovascular;  Laterality: N/A;   PROSTATE SURGERY  12/2012   SHOULDER SURGERY Bilateral      Allergies  Allergen Reactions   Penicillins     Eye swelled shut  .Has patient had a PCN reaction causing immediate rash, facial/tongue/throat swelling, SOB or lightheadedness with hypotension: Yes Has patient had a PCN reaction causing severe rash involving mucus membranes or skin necrosis: No Has patient had a PCN reaction that required hospitalization: No Has patient had a PCN reaction occurring within the last 10 years: No If all of the above answers are "NO", then may proceed with Cephalosporin use      Family History  Problem Relation Age of Onset   Stroke Mother    Heart attack Father    Stroke Father    Kidney disease Brother      Social History [name] reports that he quit smoking about 46 years ago. His smoking use included cigarettes. He has a 4.50 pack-year smoking history. He quit smokeless tobacco use about 43 years ago. [name] reports no history of alcohol use.   Review of Systems CONSTITUTIONAL: No weight loss, fever, chills, weakness or fatigue.  HEENT: Eyes: No visual loss, blurred vision, double vision or yellow sclerae.No hearing loss, sneezing, congestion, runny nose or sore throat.  SKIN: No rash or itching.  CARDIOVASCULAR: per hpi RESPIRATORY: No shortness of breath, cough or sputum.  GASTROINTESTINAL: No anorexia, nausea, vomiting or diarrhea. No abdominal pain or blood.  GENITOURINARY: No burning on urination, no polyuria NEUROLOGICAL: No headache, dizziness, syncope, paralysis, ataxia, numbness or tingling in the extremities. No change in bowel or bladder control.  MUSCULOSKELETAL: No muscle, back pain, joint pain or stiffness.  LYMPHATICS: No enlarged nodes. No history of splenectomy.   PSYCHIATRIC: No history of depression or anxiety.  ENDOCRINOLOGIC: No reports of sweating, cold or heat intolerance. No polyuria or polydipsia.  Marland Kitchen   Physical Examination Today's Vitals   03/25/22 0923 03/25/22 1001  BP: (!) 140/80 (!) 150/90  Pulse: 66   SpO2: 95%   Weight: 224 lb (101.6 kg)   Height: 5' 6"$  (1.676 m)    Body mass index is 36.15 kg/m.  Gen: resting comfortably, no acute distress HEENT: no scleral icterus, pupils equal round and reactive, no palptable cervical adenopathy,  CV: RRR, no m/r/g no jvd Resp: Clear to auscultation bilaterally GI: abdomen is soft, non-tender, non-distended, normal bowel sounds, no hepatosplenomegaly MSK: extremities are warm, no edema.  Skin: warm, no rash Neuro:  no focal deficits Psych: appropriate affect   Diagnostic Studies  Echocardiogram: 10/2017 Study Conclusions   - Left ventricle: The cavity size was normal. Systolic function was    vigorous. The estimated ejection fraction was in the range of 65%    to 70%. Wall motion was  normal; there were no regional wall    motion abnormalities. Features are consistent with a pseudonormal    left ventricular filling pattern, with concomitant abnormal    relaxation and increased filling pressure (grade 2 diastolic    dysfunction). Doppler parameters are consistent with high    ventricular filling pressure. Mild to moderate posterior wall and    severe proximal septal hypertrophy.  - Aortic valve: Mildly calcified annulus. Trileaflet. There was    mild regurgitation.  - Mitral valve: Mildly calcified annulus.  - Left atrium: The atrium was mildly dilated.  - Atrial septum: No defect or patent foramen ovale was identified.      Cardiac Catheterization: 10/2017 Ost RCA to Prox RCA lesion is 50% stenosed. Prox RCA lesion is 20% stenosed. Post Atrio lesion is 20% stenosed. Prox LAD lesion is 75% stenosed. Prox Cx to Mid Cx lesion is 10% stenosed. 1st Mrg lesion is 20%  stenosed. The left ventricular systolic function is normal. LV end diastolic pressure is normal. The left ventricular ejection fraction is 55-65% by visual estimate.   The proximal LAD has an eccentric stenosis in the region of a large 4 Joleen Stuckert diagonal vessel which in most views does not appear significantly narrowed but in the RAO cranial view appears at least 75%.  FFR and DFR were discordant with mild positivity on DFR and negative FFR.    Mild nonobstructive 20% circumflex stenoses.   Probable proximal RCA spasm with narrowing up to 50% and 20% proximal and distal RCA stenoses.   Normal LV function with an ejection fraction of 65% without focal segmental wall motion abnormalities.   RECOMMENDATION: Since the patient was not on any prior anti-ischemic medications into his hospitalization, in light of the discordant flow data and large for Joan Herschberger diagonal vessel, the initial plan will be to attempt aggressive medical therapy with high potency statin therapy, addition of amlodipine, nitrates and beta-blocker.  Will discontinue HCTZ.  Consider PCI if patient continues to experience recurrent symptomatology on a good medical regimen.   Recommend Aspirin 80m daily for moderate CAD.     Assessment and Plan   1. CAD -no symptoms, continue current meds   2. HTN - elevated bp, will come back for nursing visit for recheck.  -  Likely would change HCTZ to chlorthalidone if needed   3. Hyperlipidemia - at goal, continue current meds   4. Hypertrophic obstructive cardiomyopathy - no recent symptoms - at 77 no strong indication for ICD risk stratification as time alone has shown he is not high risk    F/u 6 months   JArnoldo Lenis M.D.

## 2022-03-25 NOTE — Patient Instructions (Signed)
Medication Instructions:  Your physician recommends that you continue on your current medications as directed. Please refer to the Current Medication list given to you today.  Labwork: None  Testing/Procedures: None  Follow-Up: Follow up with Dr. Harl Bowie in 6 months.  Nurse Visit for BP Check: 1-2 weeks  Any Other Special Instructions Will Be Listed Below (If Applicable).     If you need a refill on your cardiac medications before your next appointment, please call your pharmacy.

## 2022-04-08 ENCOUNTER — Ambulatory Visit: Payer: Medicare Other

## 2022-04-09 IMAGING — US US EXTREM LOW VENOUS*R*
1 series · 13 of 24 positions shown · non-contrast
Comparison: None.

CLINICAL DATA: Right lower extremity edema post fall 2 weeks ago

EXAM:
RIGHT LOWER EXTREMITY VENOUS DOPPLER ULTRASOUND
TECHNIQUE: Gray-scale sonography with compression, as well as color and duplex
ultrasound, were performed to evaluate the deep venous system(s)
from the level of the common femoral vein through the popliteal and
proximal calf veins.

[Series 1: us extrem low venous*right* · 0.10mm/px · 13 of 56 slices shown]
[im 1/56]
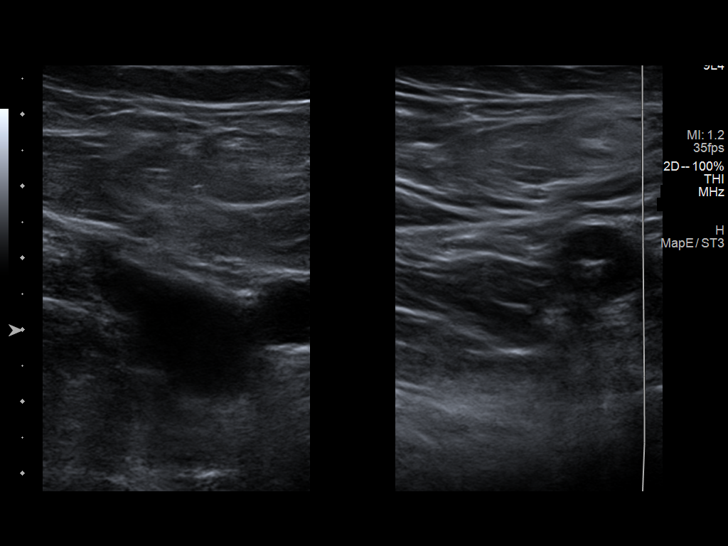
[im 5/56]
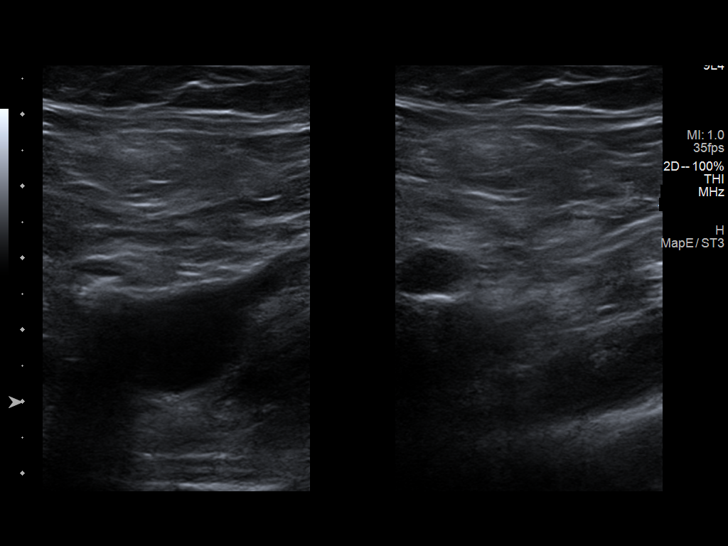
[im 10/56]
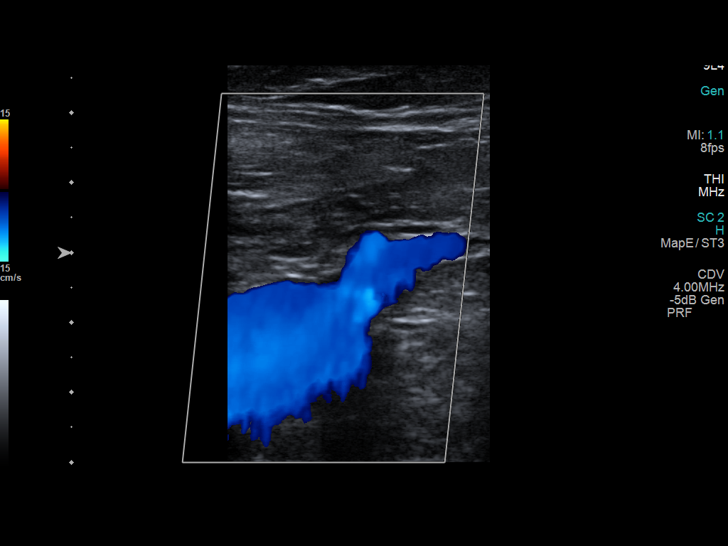
[im 15/56]
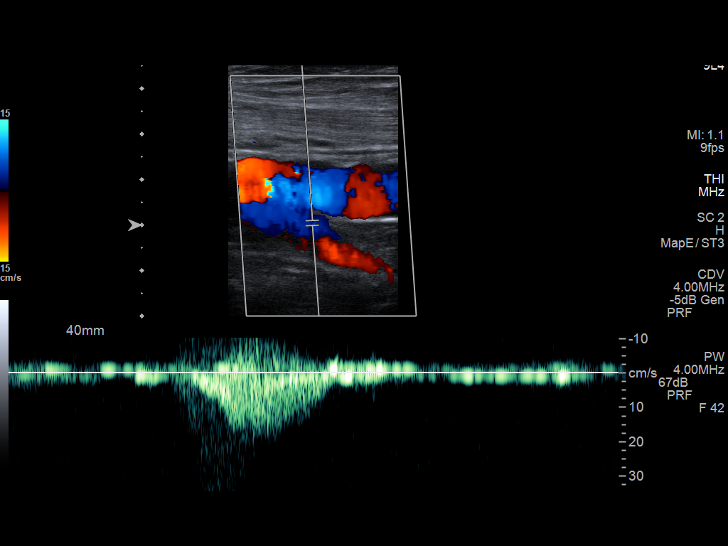
[im 20/56]
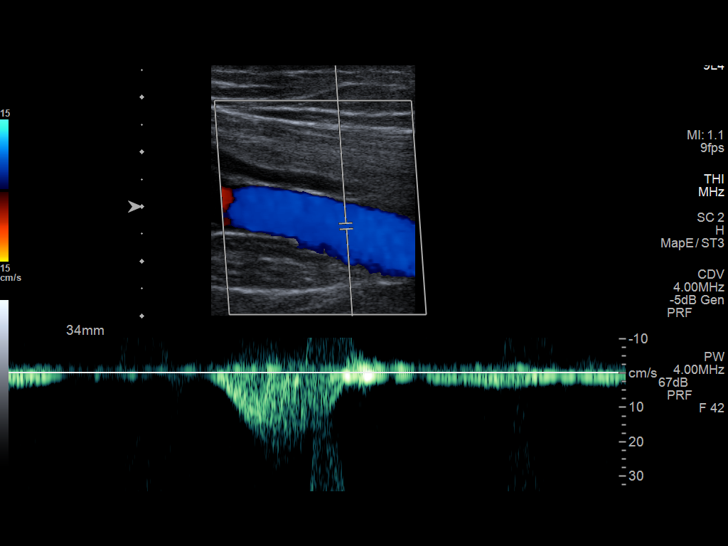
[im 24/56]
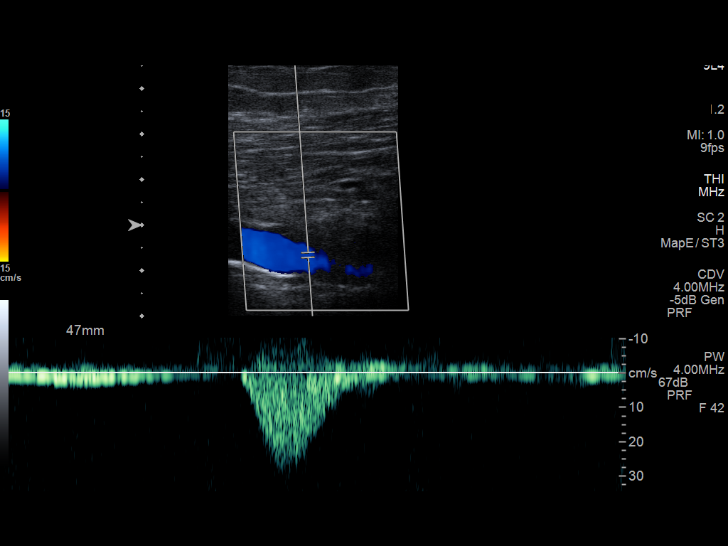
[im 29/56]
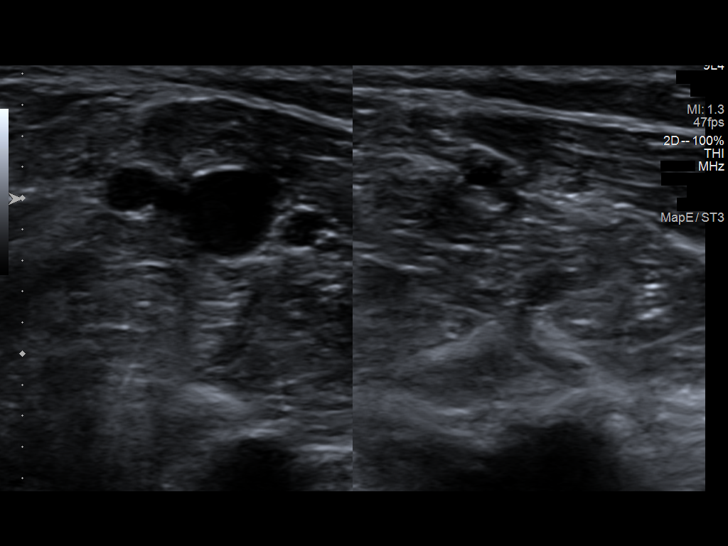
[im 32/56]
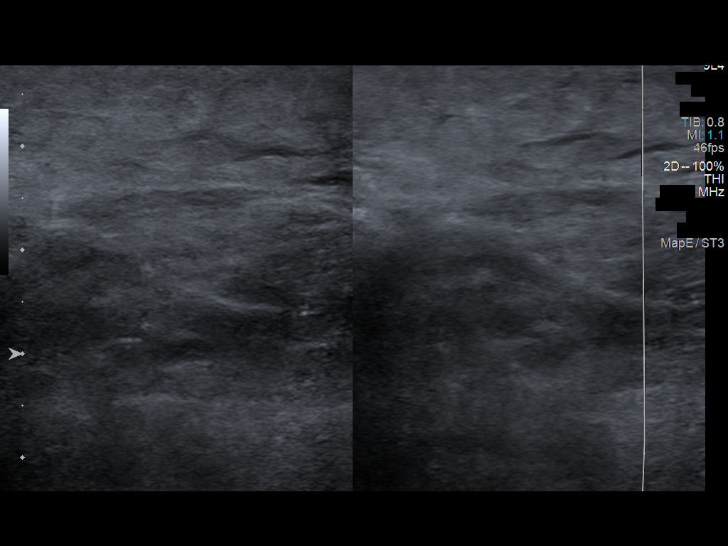
[im 36/56]
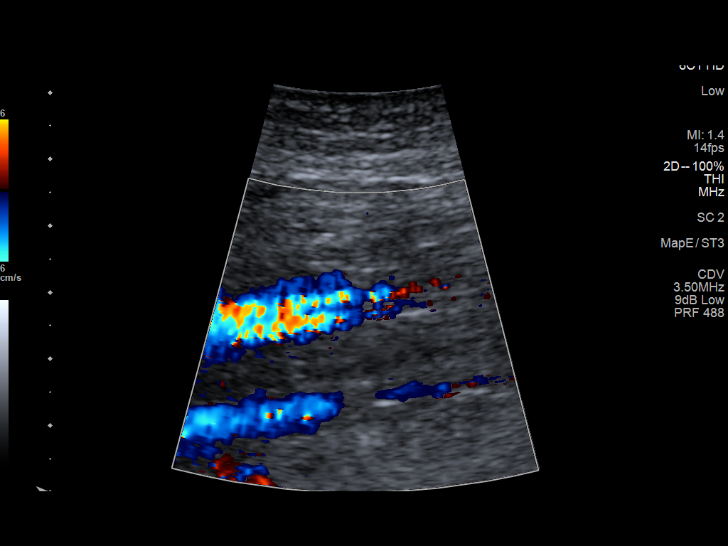
[im 41/56]
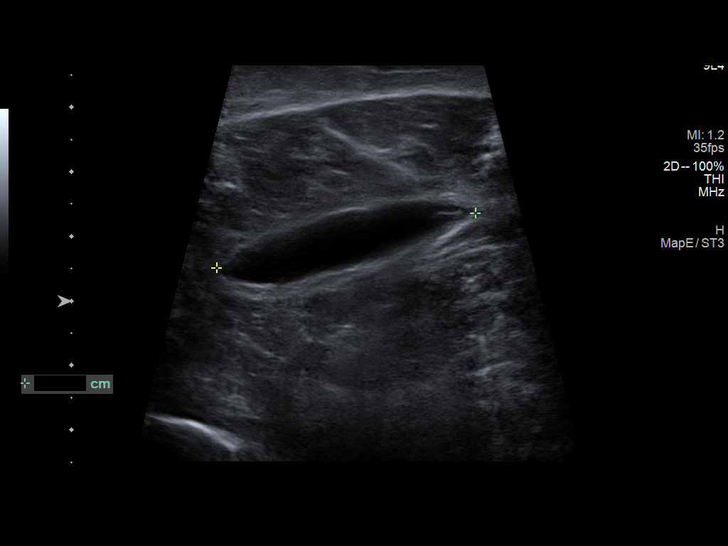
[im 46/56]
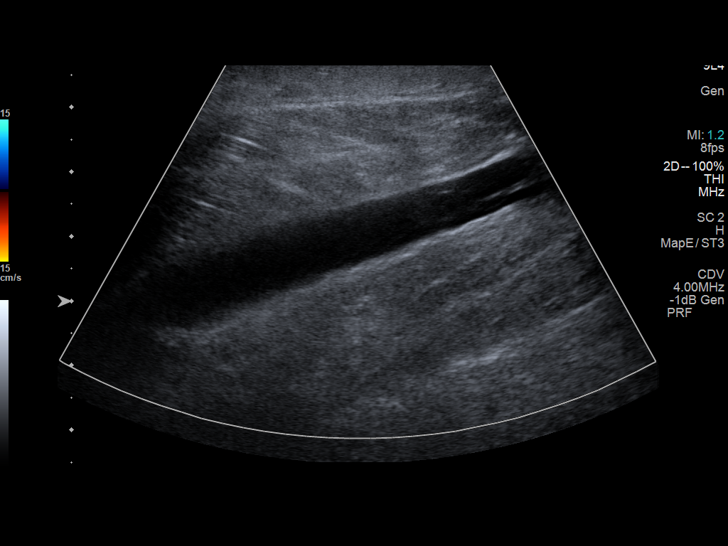
[im 51/56]
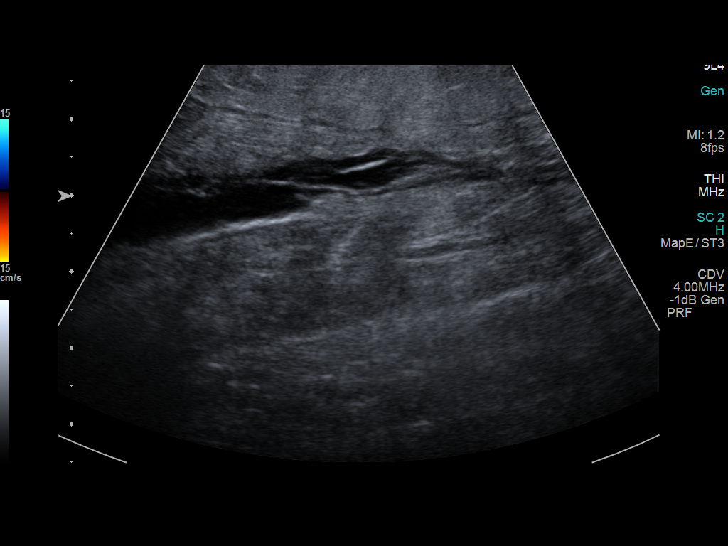
[im 56/56]
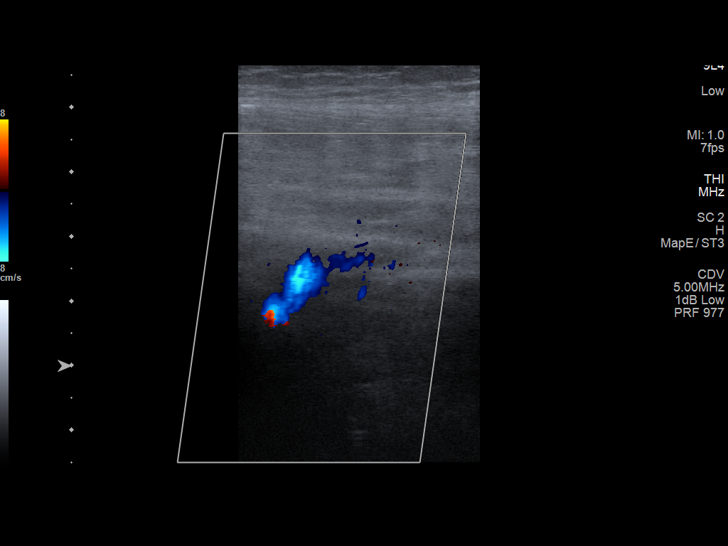

[13 of 24 positions shown; findings below may reference images not displayed]

FINDINGS: VENOUS

Normal compressibility of the common femoral, superficial femoral,
and popliteal veins, as well as the visualized calf veins.
Visualized portions of profunda femoral vein and great saphenous
vein unremarkable. No filling defects to suggest DVT on grayscale or
color Doppler imaging. Doppler waveforms show normal direction of
venous flow, normal respiratory plasticity and response to
augmentation.

Limited views of the contralateral common femoral vein are
unremarkable.

OTHER

There is a primarily anechoic structure within the posterior right
calf measuring approximately 8 x 1 x 4.1 cm corresponding to area of
pain. Some linear bands of echogenicity are present within. There is
no flow on color Doppler.

Limitations: none
IMPRESSION: No evidence of right lower extremity DVT.

Complicated cystic appearing structure of the posterior right calf
corresponding to area of pain. May reflect a hematoma given history
of fall.

## 2022-06-23 DIAGNOSIS — J343 Hypertrophy of nasal turbinates: Secondary | ICD-10-CM | POA: Diagnosis not present

## 2022-06-23 DIAGNOSIS — T485X5A Adverse effect of other anti-common-cold drugs, initial encounter: Secondary | ICD-10-CM | POA: Diagnosis not present

## 2022-06-23 DIAGNOSIS — J310 Chronic rhinitis: Secondary | ICD-10-CM | POA: Diagnosis not present

## 2022-09-28 DIAGNOSIS — E7849 Other hyperlipidemia: Secondary | ICD-10-CM | POA: Diagnosis not present

## 2022-09-28 DIAGNOSIS — E782 Mixed hyperlipidemia: Secondary | ICD-10-CM | POA: Diagnosis not present

## 2022-09-28 DIAGNOSIS — I2510 Atherosclerotic heart disease of native coronary artery without angina pectoris: Secondary | ICD-10-CM | POA: Diagnosis not present

## 2022-09-28 DIAGNOSIS — E039 Hypothyroidism, unspecified: Secondary | ICD-10-CM | POA: Diagnosis not present

## 2022-09-28 DIAGNOSIS — Z0001 Encounter for general adult medical examination with abnormal findings: Secondary | ICD-10-CM | POA: Diagnosis not present

## 2022-10-21 ENCOUNTER — Ambulatory Visit: Payer: Medicare Other | Attending: Cardiology | Admitting: Cardiology

## 2022-10-21 ENCOUNTER — Encounter: Payer: Self-pay | Admitting: Cardiology

## 2022-10-21 VITALS — BP 150/75 | HR 50 | Ht 66.0 in | Wt 219.0 lb

## 2022-10-21 DIAGNOSIS — E782 Mixed hyperlipidemia: Secondary | ICD-10-CM

## 2022-10-21 DIAGNOSIS — I25118 Atherosclerotic heart disease of native coronary artery with other forms of angina pectoris: Secondary | ICD-10-CM

## 2022-10-21 DIAGNOSIS — I421 Obstructive hypertrophic cardiomyopathy: Secondary | ICD-10-CM | POA: Diagnosis not present

## 2022-10-21 DIAGNOSIS — I10 Essential (primary) hypertension: Secondary | ICD-10-CM

## 2022-10-21 NOTE — Progress Notes (Signed)
Clinical Summary Mr. Conceicao is a 77 y.o.male seen today for follow up of the following medical problems.    1. CAD - s/p cath in 10/2017 which showed eccentric stenosis along the proximal LAD which appeared to have 30% stenosis except RAO cranial view and appeared to have 75% stenosis with discordant mildly positive DFR and negative FFR and medical management initially recommended given large diagonal Tyonna Talerico)   09/2019 cath: 80% prox LAD, mod RCA nonobstructive. Received DES to LAD - 09/2019 echo LVEF severe septal hypertrophy, LVEF 65-70%, no WMAs, grade II DDx. No dynamic gradient reported   -no chest pains, no SOB/DOE - compliant with meds     2. HTN - compliant with meds      3. 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   5. OSA - using cpap machine, mixed compliance - followed by Dr Vickey Huger     SH: wife has large humming birds garden  Past Medical History:  Diagnosis Date   CAD (coronary artery disease)    a. s/p cath in 10/2017 which showed eccentric stenosis along the proximal LAD which appeared to have 30% stenosis except RAO cranial view and appeared to have 75% stenosis with discordant mildly positive DFR and negative FFR and medical management initially recommended given large diagonal Retia Cordle   Cancer ([facility]) 07/2012   prostate   Heart disease    Hypertension      Allergies  Allergen Reactions   Penicillins     Eye swelled shut  .Has patient had a PCN reaction causing immediate rash, facial/tongue/throat swelling, SOB or lightheadedness with hypotension: Yes Has patient had a PCN reaction causing severe rash involving mucus membranes or skin necrosis: No Has patient had a PCN reaction that  required hospitalization: No Has patient had a PCN reaction occurring within the last 10 years: No If all of the above answers are "NO", then may proceed with Cephalosporin use     Current Outpatient Medications  Medication Sig Dispense Refill   amLODipine (NORVASC) 10 MG tablet Take 1 tablet (10 mg total) by mouth daily. 90 tablet 1   aspirin 81 MG tablet Take 81 mg by mouth daily.     hydrochlorothiazide (MICROZIDE) 12.5 MG capsule Take 1 capsule by mouth once daily 90 capsule 3   losartan (COZAAR) 100 MG tablet Take 1 tablet by mouth once daily 90 tablet 2   metoprolol succinate (TOPROL-XL) 25 MG 24 hr tablet Take 0.5 tablets (12.5 mg total) by mouth daily. 45 tablet 3   rosuvastatin (CRESTOR) 5 MG tablet Take 1 tablet by mouth once daily 90 tablet 0   No current facility-administered medications for this visit.     Past Surgical History:  Procedure Laterality Date   COLONOSCOPY N/A 08/28/2014   Procedure: COLONOSCOPY;  Surgeon: Franky Macho, MD;  [facility];  Service: Gastroenterology;  Laterality: N/A;   CORONARY PRESSURE/FFR STUDY N/A 11/04/2017   Procedure: INTRAVASCULAR PRESSURE WIRE/DFR vs FFR  STUDY;  Surgeon: Lennette Bihari, MD;  [facility];  Service: Cardiovascular;  Laterality: N/A;   CORONARY PRESSURE/FFR STUDY N/A 10/02/2019   Procedure:  INTRAVASCULAR PRESSURE WIRE/FFR STUDY;  Surgeon: Yvonne Kendall, MD;  Location: [facility];  Service: Cardiovascular;  Laterality: N/A;   CORONARY STENT INTERVENTION N/A 10/02/2019   Procedure: CORONARY STENT INTERVENTION;  Surgeon: Yvonne Kendall, MD;  [facility];  Service: Cardiovascular;  Laterality: N/A;   LEFT HEART CATH AND CORONARY ANGIOGRAPHY N/A 11/04/2017   Procedure: LEFT HEART CATH AND CORONARY ANGIOGRAPHY;  Surgeon: Lennette Bihari, MD;  [facility];  Service: Cardiovascular;  Laterality: N/A;   LEFT HEART CATH AND CORONARY ANGIOGRAPHY N/A 10/02/2019    Procedure: LEFT HEART CATH AND CORONARY ANGIOGRAPHY;  Surgeon: Yvonne Kendall, MD;  [facility];  Service: Cardiovascular;  Laterality: N/A;   PROSTATE SURGERY  12/2012   SHOULDER SURGERY Bilateral      Allergies  Allergen Reactions   Penicillins     Eye swelled shut  .Has patient had a PCN reaction causing immediate rash, facial/tongue/throat swelling, SOB or lightheadedness with hypotension: Yes Has patient had a PCN reaction causing severe rash involving mucus membranes or skin necrosis: No Has patient had a PCN reaction that required hospitalization: No Has patient had a PCN reaction occurring within the last 10 years: No If all of the above answers are "NO", then may proceed with Cephalosporin use      Family History  Problem Relation Age of Onset   Stroke Mother    Heart attack Father    Stroke Father    Kidney disease Brother      Social History Mr. Lockamy reports that he quit smoking about 46 years ago. His smoking use included cigarettes. He started smoking about 64 years ago. He has a 4.5 pack-year smoking history. He quit smokeless tobacco use about 43 years ago. Mr. Farrand reports no history of alcohol use.   Review of Systems CONSTITUTIONAL: No weight loss, fever, chills, weakness or fatigue.  HEENT: Eyes: No visual loss, blurred vision, double vision or yellow sclerae.No hearing loss, sneezing, congestion, runny nose or sore throat.  SKIN: No rash or itching.  CARDIOVASCULAR: per hpi RESPIRATORY: No shortness of breath, cough or sputum.  GASTROINTESTINAL: No anorexia, nausea, vomiting or diarrhea. No abdominal pain or blood.  GENITOURINARY: No burning on urination, no polyuria NEUROLOGICAL: No headache, dizziness, syncope, paralysis, ataxia, numbness or tingling in the extremities. No change in bowel or bladder control.  MUSCULOSKELETAL: No muscle, back pain, joint pain or stiffness.  LYMPHATICS: No enlarged nodes. No history of  splenectomy.  PSYCHIATRIC: No history of depression or anxiety.  ENDOCRINOLOGIC: No reports of sweating, cold or heat intolerance. No polyuria or polydipsia.  Marland Kitchen   Physical Examination Today's Vitals   10/21/22 0828  BP: (!) 144/70  Pulse: (!) 50  SpO2: 96%  Weight: 219 lb (99.3 kg)  Height: 5\' 6"  (1.676 m)   Body mass index is 35.35 kg/m.  Gen: resting comfortably, no acute distress HEENT: no scleral icterus, pupils equal round and reactive, no palptable cervical adenopathy,  CV: RRR, 2/6 systolic murmur rusb, no JVD. Bilateral carotid bruits Resp: Clear to auscultation bilaterally GI: abdomen is soft, non-tender, non-distended, normal bowel sounds, no hepatosplenomegaly MSK: extremities are warm, no edema.  Skin: warm, no rash Neuro:  no focal deficits Psych: appropriate affect   Diagnostic Studies  Echocardiogram: 10/2017 Study Conclusions   - Left ventricle: The cavity size was normal. Systolic function was    vigorous. The estimated ejection fraction was in the range of 65%  to 70%. Wall motion was normal; there were no regional wall    motion abnormalities. Features are consistent with a pseudonormal    left ventricular filling pattern, with concomitant abnormal    relaxation and increased filling pressure (grade 2 diastolic    dysfunction). Doppler parameters are consistent with high    ventricular filling pressure. Mild to moderate posterior wall and    severe proximal septal hypertrophy.  - Aortic valve: Mildly calcified annulus. Trileaflet. There was    mild regurgitation.  - Mitral valve: Mildly calcified annulus.  - Left atrium: The atrium was mildly dilated.  - Atrial septum: No defect or patent foramen ovale was identified.      Cardiac Catheterization: 10/2017 Ost RCA to Prox RCA lesion is 50% stenosed. Prox RCA lesion is 20% stenosed. Post Atrio lesion is 20% stenosed. Prox LAD lesion is 75% stenosed. Prox Cx to Mid Cx lesion is 10%  stenosed. 1st Mrg lesion is 20% stenosed. The left ventricular systolic function is normal. LV end diastolic pressure is normal. The left ventricular ejection fraction is 55-65% by visual estimate.   The proximal LAD has an eccentric stenosis in the region of a large 4 Jadon Ressler diagonal vessel which in most views does not appear significantly narrowed but in the RAO cranial view appears at least 75%.  FFR and DFR were discordant with mild positivity on DFR and negative FFR.    Mild nonobstructive 20% circumflex stenoses.   Probable proximal RCA spasm with narrowing up to 50% and 20% proximal and distal RCA stenoses.   Normal LV function with an ejection fraction of 65% without focal segmental wall motion abnormalities.   RECOMMENDATION: Since the patient was not on any prior anti-ischemic medications into his hospitalization, in light of the discordant flow data and large for Kyasia Steuck diagonal vessel, the initial plan will be to attempt aggressive medical therapy with high potency statin therapy, addition of amlodipine, nitrates and beta-blocker.  Will discontinue HCTZ.  Consider PCI if patient continues to experience recurrent symptomatology on a good medical regimen.   Recommend Aspirin 81mg  daily for moderate CAD.       Assessment and Plan  1. CAD -denies any recent symptoms, continue current meds   2. HTN - bp elevated, plan for nursing visit for recheck. If remains elevated likely would add aldactone. Avoid more potent diuretic dosing due to hypertrophic CM .   3. Hyperlipidemia - he is at goal, continue current meds   4. Hypertrophic obstructive cardiomyopathy - no recent symptoms - at 77 no strong indication for ICD risk stratification as time alone has shown he is not high risk        Antoine Poche, M.D.

## 2022-10-21 NOTE — Patient Instructions (Signed)
Medication Instructions:  Your physician recommends that you continue on your current medications as directed. Please refer to the Current Medication list given to you today.  *If you need a refill on your cardiac medications before your next appointment, please call your pharmacy*   Lab Work: None If you have labs (blood work) drawn today and your tests are completely normal, you will receive your results only by: MyChart Message (if you have MyChart) OR A paper copy in the mail If you have any lab test that is abnormal or we need to change your treatment, we will call you to review the results.   Testing/Procedures: None   Follow-Up: At [facility], you and your health needs are our priority.  As part of our continuing mission to provide you with exceptional heart care, we have created designated Provider Care Teams.  These Care Teams include your primary Cardiologist (physician) and Advanced Practice Providers (APPs -  Physician Assistants and Nurse Practitioners) who all work together to provide you with the care you need, when you need it.  We recommend signing up for the patient portal called "MyChart".  Sign up information is provided on this After Visit Summary.  MyChart is used to connect with patients for Virtual Visits (Telemedicine).  Patients are able to view lab/test results, encounter notes, upcoming appointments, etc.  Non-urgent messages can be sent to your provider as well.   To learn more about what you can do with MyChart, go to [email or web address].    Your next appointment:   6 month(s)  Provider:   You may see Dina Rich, MD or one of the following Advanced Practice Providers on your designated Care Team:   Randall An, PA-C  Jacolyn Reedy, [place]      Other Instructions Nurse Visit in 2-3 weeks for bp check. Please bring a log of your home blood pressures.

## 2022-11-02 ENCOUNTER — Ambulatory Visit: Payer: Medicare Other | Attending: Cardiology | Admitting: *Deleted

## 2022-11-02 VITALS — BP 160/70 | HR 64 | Ht 66.0 in | Wt 217.6 lb

## 2022-11-02 DIAGNOSIS — Z0130 Encounter for examination of blood pressure without abnormal findings: Secondary | ICD-10-CM | POA: Diagnosis not present

## 2022-11-02 NOTE — Patient Instructions (Signed)
Medication Instructions:  Your physician recommends that you continue on your current medications as directed. Please refer to the Current Medication list given to you today.   Labwork: NONE   Testing/Procedures: NONE  Follow-Up: Your physician recommends that you schedule a follow-up appointment in: As scheduled    Any Other Special Instructions Will Be Listed Below (If Applicable).     If you need a refill on your cardiac medications before your next appointment, please call your pharmacy.

## 2022-11-02 NOTE — Progress Notes (Signed)
BP is too high, can he get a bmet/mg and aldosterone/renin ratio. Pending results likely add additional bp med  Dominga Ferry MD

## 2022-11-03 ENCOUNTER — Telehealth: Payer: Self-pay | Admitting: *Deleted

## 2022-11-03 DIAGNOSIS — I10 Essential (primary) hypertension: Secondary | ICD-10-CM

## 2022-11-03 DIAGNOSIS — Z79899 Other long term (current) drug therapy: Secondary | ICD-10-CM

## 2022-11-03 NOTE — Telephone Encounter (Signed)
Pt notified of Dr. Verna Czech response and the need to have labs done. Pt states that he will go one day this week.

## 2022-11-03 NOTE — Telephone Encounter (Addendum)
-----   Message from Dina Rich sent at 11/02/2022  2:19 PM EDT -----  BP is too high, can he get a bmet/mg and aldosterone/renin ratio. Pending results likely add additional bp med   Dominga Ferry MD  ----- Message ----- From: Kerney Elbe, LPN Sent: 1/61/0960  12:06 PM EDT To: Antoine Poche, MD

## 2022-11-03 NOTE — Telephone Encounter (Signed)
-----   Message from Dina Rich sent at 11/02/2022  2:19 PM EDT -----    ----- Message ----- From: Kerney Elbe, LPN Sent: 04/16/6576  12:06 PM EDT To: Antoine Poche, MD

## 2022-11-04 ENCOUNTER — Other Ambulatory Visit (HOSPITAL_COMMUNITY)
Admission: RE | Admit: 2022-11-04 | Discharge: 2022-11-04 | Disposition: A | Discharge status: Home / Self Care | Payer: Medicare Other | Source: Ambulatory Visit | Attending: Cardiology | Admitting: Cardiology

## 2022-11-04 ENCOUNTER — Ambulatory Visit: Payer: Medicare Other

## 2022-11-04 ENCOUNTER — Telehealth: Payer: Self-pay | Admitting: *Deleted

## 2022-11-04 DIAGNOSIS — I10 Essential (primary) hypertension: Secondary | ICD-10-CM | POA: Diagnosis not present

## 2022-11-04 DIAGNOSIS — Z79899 Other long term (current) drug therapy: Secondary | ICD-10-CM | POA: Insufficient documentation

## 2022-11-04 LAB — BASIC METABOLIC PANEL
Anion gap: 9 (ref 5–15)
BUN: 12 mg/dL (ref 8–23)
CO2: 27 mmol/L (ref 22–32)
Calcium: 9.2 mg/dL (ref 8.9–10.3)
Chloride: 102 mmol/L (ref 98–111)
Creatinine, Ser: 0.84 mg/dL (ref 0.61–1.24)
GFR, Estimated: >60 mL/min (ref >60)
Glucose, Bld: 104 mg/dL — ABNORMAL HIGH (ref 70–99)
Potassium: 4.2 mmol/L (ref 3.5–5.1)
Sodium: 138 mmol/L (ref 135–145)

## 2022-11-04 LAB — MAGNESIUM: Magnesium: 1.9 mg/dL (ref 1.7–2.4)

## 2022-11-04 MED ORDER — SPIRONOLACTONE 25 MG PO TABS: 25.0000 mg | ORAL_TABLET | Freq: Every day | ORAL | 3 refills | Status: DC

## 2022-11-04 NOTE — Telephone Encounter (Signed)
-----   Message from Dina Rich sent at 11/04/2022 10:48 AM EDT ----- Labs look fine, please start aldactone 25mg  daily with a repeat bmet in 2 weeks  Dominga Ferry MD

## 2022-11-04 NOTE — Telephone Encounter (Signed)
Pt notified and order placed

## 2022-11-04 NOTE — Addendum Note (Signed)
Addended by: Kerney Elbe on: 11/04/2022 11:13 AM   Modules accepted: Orders

## 2022-11-09 LAB — ALDOSTERONE + RENIN ACTIVITY W/ RATIO
ALDO / PRA Ratio: 13.2 (ref 0.0–30.0)
Aldosterone: 4.7 ng/dL (ref 0.0–30.0)
PRA LC/MS/MS: 0.355 ng/mL/h (ref 0.167–5.380)

## 2023-05-06 DIAGNOSIS — D225 Melanocytic nevi of trunk: Secondary | ICD-10-CM | POA: Diagnosis not present

## 2023-05-06 DIAGNOSIS — L570 Actinic keratosis: Secondary | ICD-10-CM | POA: Diagnosis not present

## 2023-05-06 DIAGNOSIS — Z1283 Encounter for screening for malignant neoplasm of skin: Secondary | ICD-10-CM | POA: Diagnosis not present

## 2023-05-06 DIAGNOSIS — X32XXXD Exposure to sunlight, subsequent encounter: Secondary | ICD-10-CM | POA: Diagnosis not present

## 2023-05-31 ENCOUNTER — Ambulatory Visit: Attending: Medical | Admitting: Medical

## 2023-05-31 ENCOUNTER — Encounter: Payer: Self-pay | Admitting: Medical

## 2023-05-31 VITALS — BP 156/58 | HR 58 | Ht 66.0 in | Wt 221.0 lb

## 2023-05-31 DIAGNOSIS — I421 Obstructive hypertrophic cardiomyopathy: Secondary | ICD-10-CM | POA: Diagnosis not present

## 2023-05-31 DIAGNOSIS — E782 Mixed hyperlipidemia: Secondary | ICD-10-CM | POA: Diagnosis not present

## 2023-05-31 DIAGNOSIS — I25118 Atherosclerotic heart disease of native coronary artery with other forms of angina pectoris: Secondary | ICD-10-CM | POA: Diagnosis not present

## 2023-05-31 DIAGNOSIS — I10 Essential (primary) hypertension: Secondary | ICD-10-CM | POA: Diagnosis not present

## 2023-05-31 NOTE — Progress Notes (Signed)
 Cardiology Office Note:  .   Date:  05/31/2023  ID:  [name], DOB Nov 29, 1945, MRN [id number] PCP: Minus Amel, MD  [facility] Providers Cardiologist:  Armida Lander, MD     History of Present Illness: .   [name] is a 78 y.o. male with h/o CAD s/p DES to LAD 09/2019, HTN, HLD, hypertrophic CM, OSA not on CPAP  who is being seen for 6 month follow-up.   The patient had a cath in 2019 that showed eccentric stenosis along the proximal LAD which appeared to have 30% stenosis except RAO cranial view and appeared to have 75% stenosis with discordant mildly positive DFR and negative FFR and medical management recommended given large diagonal branch. Cath in 8/201 showed 80% pLAD and mod RCA nonobstructive disease. He received DES to the LAD. Echo 2021 showed LVEF 65-70%, severe septal hypertrophy, no WMA, G2DD. Cath did show a gradient.   The patient was last seen 10/2022 and was stable from a cardiac perspective.   Today, the patient is overall doing well. Patient has rare occurrences of "feeling bad" at nighttime with nausea and headache. He has a hard time sleeping due to ?sinus issues. Once he gets up he feels OK. He stopped wearing CPAP many years ago. He denies chest pain . He has occasional SOB when walking up a steep hill. He denies lightheadedness, dizziness, palpitations, lower leg edema. Diet is not good, needs to work on this.    Studies Reviewed: .        Echo 09/2019 1. Severe septal hypertrophy with otherwise mild concentric hypertrophy.  Left ventricular ejection fraction, by estimation, is 65 to 70%. The left  ventricle has normal function. The left ventricle has no regional wall  motion abnormalities. There is  severe asymmetric left ventricular hypertrophy of the basal-septal  segment. Left ventricular diastolic parameters are consistent with Grade  II diastolic dysfunction (pseudonormalization). Elevated left ventricular  end-diastolic  pressure.   2. Right ventricular systolic function is normal. The right ventricular  size is normal. There is normal pulmonary artery systolic pressure.   3. Left atrial size was severely dilated.   4. The mitral valve is normal in structure. No evidence of mitral valve  regurgitation. No evidence of mitral stenosis.   5. The aortic valve is tricuspid. Aortic valve regurgitation is mild. No  aortic stenosis is present.   6. The inferior vena cava is normal in size with <50% respiratory  variability, suggesting right atrial pressure of 8 mmHg.   LHC 09/2019 Conclusions: Severe single vessel coronary artery disease with 80% proximal LAD stenosis at takeoff of large D1 branch. Moderate, nonobstructive ostial RCA disease (DFR = 0.97). Hyperdynamic left ventricular contraction with dynamic mid-cavity gradient (post-PVC gradient up to ~110 mmHg). Mildly elevated left ventricular filling pressure (LVEDP 15-20 mmHg). Successful PCI to proximal LAD using Resolute Onyx 3.0 x 15 mm DES with 0% residual stenosis and TIMI-3 flow.   Recommendations: Overnight extended recovery. Dual antiplatelet therapy with aspirin  and clopidogrel  for at least 6 months. Aggressive secondary prevention. Hold isosorbide  mononitrate and add metoprolol  succinate 12.5 mg daily for BP and heart rate control in the setting of possible HOCM. Consider repeat outpatient echo versus cardiac MRI for further evaluation of possible HOCM.   Sammy Crisp, MD [facility]  Myoview  lexiscan  09/2019 Narrative & Impression  Small, mild intensity, apical septal defect that is reversible and suggestive of ischemia. Increased TID ratio of 1.29 raises possibility of more  diffuse balanced subendocardial ischemia. Diagnostic ST segment depression was noted in the inferolateral leads with ST segment elevation in aVR raising the possibility of diffuse subendocardial ischemia, possibly multivessel distribution and therefore high risk  findings. Blood pressure demonstrated a hypertensive response to exercise. This is a high risk study. Nuclear stress EF: 52%.         Physical Exam:   VS:  BP (!) 156/58 (BP Location: Right Arm, Patient Position: Sitting, Cuff Size: Large)   Pulse (!) 58   Ht 5\' 6"  (1.676 m)   Wt 221 lb (100.2 kg)   SpO2 95%   BMI 35.67 kg/m    Wt Readings from Last 3 Encounters:  05/31/23 221 lb (100.2 kg)  11/02/22 217 lb 9.6 oz (98.7 kg)  10/21/22 219 lb (99.3 kg)    GEN: Well nourished, well developed in no acute distress NECK: No JVD; No carotid bruits CARDIAC: RRR, no murmurs, rubs, gallops RESPIRATORY:  Clear to auscultation without rales, wheezing or rhonchi  ABDOMEN: Soft, non-tender, non-distended EXTREMITIES:  No edema; No deformity   ASSESSMENT AND PLAN: .    CAD s/p DES to LAD 2021 The patient denies chest pain. He has SOB on severe exertion, feels this is due to age and deconditioning. Continue ASA 81mg  daily, Losartan  100mg  daily, Toprol  25mg  daily, Crestor  5mg  daily. No further ischemic work-up at this time.   HTN BP elevated today. Want to avoid diuresis with LVH. Cannot increase BB due to bradycardia. Patient tries to be active, diet is very poor. I recommended lifestyle changes. Continue spiro 25mg  daily, hydrochlorothiazide  12.5mg  daily, Losartan  100mg  daily, Toprol  25mg  daily, and Amlodipine  10mg  daily.   HLD Last LDL was in 2022. PCP will update labs this year. Continue Crestor  5mg  daily.   Hypertrophic CM Echo in 2021 showed LVEF 65-70%, no WMA, severe LVH, G2DD, severely dilated left atria, mild AI. Avoid dehydration. Patient has DOE as above. I will repeat echocardiogram. He is euvolemic on exam. Not felt to be good ICD candidate as he he is high risk.        Dispo: Follow-up in 6 months  Signed, Ardelle Haliburton Rebekah Canada, PA-C

## 2023-05-31 NOTE — Patient Instructions (Signed)
 Medication Instructions:  Your physician recommends that you continue on your current medications as directed. Please refer to the Current Medication list given to you today.   Labwork: None today  Testing/Procedures: Your physician has requested that you have an echocardiogram. Echocardiography is a painless test that uses sound waves to create images of your heart. It provides your doctor with information about the size and shape of your heart and how well your heart's chambers and valves are working. This procedure takes approximately one hour. There are no restrictions for this procedure. Please do NOT wear cologne, perfume, aftershave, or lotions (deodorant is allowed). Please arrive 15 minutes prior to your appointment time.  Please note: We ask at that you not bring children with you during ultrasound (echo/ vascular) testing. Due to room size and safety concerns, children are not allowed in the ultrasound rooms during exams. Our front office staff cannot provide observation of children in our lobby area while testing is being conducted. An adult accompanying a patient to their appointment will only be allowed in the ultrasound room at the discretion of the ultrasound technician under special circumstances. We apologize for any inconvenience.   Follow-Up: 6 months  Any Other Special Instructions Will Be Listed Below (If Applicable).  If you need a refill on your cardiac medications before your next appointment, please call your pharmacy.

## 2023-06-18 ENCOUNTER — Ambulatory Visit (HOSPITAL_COMMUNITY)
Admission: RE | Admit: 2023-06-18 | Discharge: 2023-06-18 | Disposition: A | Discharge status: Home / Self Care | Source: Ambulatory Visit | Attending: Medical | Admitting: Medical

## 2023-06-18 DIAGNOSIS — I421 Obstructive hypertrophic cardiomyopathy: Secondary | ICD-10-CM | POA: Insufficient documentation

## 2023-06-18 LAB — ECHOCARDIOGRAM COMPLETE
AV Mean grad: 4 mmHg
AV Peak grad: 8.1 mmHg
Ao pk vel: 1.42 m/s
Area-P 1/2: 4.6 cm2
S' Lateral: 2.85 cm

## 2023-06-18 MED ORDER — PERFLUTREN LIPID MICROSPHERE
1.0000 mL | INTRAVENOUS | Status: AC | PRN
  Administered 2023-06-18: 2 mL via INTRAVENOUS

## 2023-06-18 NOTE — Progress Notes (Signed)
  Echocardiogram 2D Echocardiogram has been performed.  Royden Corin 06/18/2023, 10:27 AM

## 2023-06-21 ENCOUNTER — Other Ambulatory Visit: Payer: Self-pay

## 2023-06-21 ENCOUNTER — Telehealth: Payer: Self-pay

## 2023-06-21 DIAGNOSIS — I517 Cardiomegaly: Secondary | ICD-10-CM

## 2023-06-21 NOTE — Telephone Encounter (Signed)
 The patient has been notified of the result and verbalized understanding.  All questions (if any) were answered. Annabel Barns, RN 06/21/2023 3:24 PM    Patient will go to [facility]   PCP copied

## 2023-06-21 NOTE — Telephone Encounter (Signed)
-----   Message from Nurse Grayson Leaf sent at 06/21/2023  2:45 PM EDT -----  ----- Message ----- From: Durward Gillis, PA-C Sent: 06/21/2023  10:13 AM EDT To: Chapman Commodore, RN  Echo showed known severe LVH with EF 70-75%. Elevated LVEDP , which may mean extra volume.  Lets check a BNP.

## 2023-06-22 DIAGNOSIS — R0602 Shortness of breath: Secondary | ICD-10-CM | POA: Diagnosis not present

## 2023-06-22 DIAGNOSIS — I517 Cardiomegaly: Secondary | ICD-10-CM | POA: Diagnosis not present

## 2023-06-23 LAB — BRAIN NATRIURETIC PEPTIDE: BNP: 301.5 pg/mL — ABNORMAL HIGH (ref 0.0–100.0)

## 2023-06-25 ENCOUNTER — Ambulatory Visit: Payer: Self-pay | Admitting: Medical

## 2023-06-25 DIAGNOSIS — Z79899 Other long term (current) drug therapy: Secondary | ICD-10-CM

## 2023-06-25 MED ORDER — FUROSEMIDE 20 MG PO TABS: 20.0000 mg | ORAL_TABLET | Freq: Every day | ORAL | 3 refills | Status: DC

## 2023-07-30 ENCOUNTER — Ambulatory Visit: Admitting: Student

## 2023-11-30 ENCOUNTER — Encounter (INDEPENDENT_AMBULATORY_CARE_PROVIDER_SITE_OTHER): Payer: Self-pay

## 2023-12-22 ENCOUNTER — Ambulatory Visit: Admitting: Cardiovascular Disease

## 2023-12-29 ENCOUNTER — Encounter (INDEPENDENT_AMBULATORY_CARE_PROVIDER_SITE_OTHER): Payer: Self-pay | Admitting: Otolaryngology

## 2023-12-29 ENCOUNTER — Ambulatory Visit (INDEPENDENT_AMBULATORY_CARE_PROVIDER_SITE_OTHER): Admitting: Otolaryngology

## 2023-12-29 VITALS — BP 174/68 | HR 60 | Temp 98.0°F | Ht 66.0 in | Wt 228.0 lb

## 2023-12-29 DIAGNOSIS — J3489 Other specified disorders of nose and nasal sinuses: Secondary | ICD-10-CM

## 2023-12-29 DIAGNOSIS — J310 Chronic rhinitis: Secondary | ICD-10-CM

## 2023-12-29 DIAGNOSIS — J342 Deviated nasal septum: Secondary | ICD-10-CM

## 2023-12-29 DIAGNOSIS — J343 Hypertrophy of nasal turbinates: Secondary | ICD-10-CM | POA: Diagnosis not present

## 2023-12-29 NOTE — Progress Notes (Signed)
 CC: Chronic nasal obstruction  Discussed the use of AI scribe software for clinical note transcription with the patient, who gave verbal consent to proceed.  History of Present Illness [name] is a 78 year old male who presents with chronic nasal obstruction and difficulty breathing, especially at night.  He experiences chronic nasal obstruction and difficulty breathing, particularly when lying down, which has progressively worsened over the past couple of years. His nasal passages 'close up' at night, leading him to dread nighttime and often requiring him to sleep in a chair to alleviate symptoms. No recent nasal trauma, but he recalls a past incident during football where he was 'stiff-armed,' though this occurred many years ago. No recent sinus infections.  He has a history of using nasal sprays such as Flonase and Nasonex for congestion, but these did not provide relief and caused irritation, leading him to discontinue their use. He currently uses a saline solution for temporary relief. He reports occasional environmental allergies.  He has no previous ENT surgery.    Past Medical History:  Diagnosis Date   CAD (coronary artery disease)    a. s/p cath in 10/2017 which showed eccentric stenosis along the proximal LAD which appeared to have 30% stenosis except RAO cranial view and appeared to have 75% stenosis with discordant mildly positive DFR and negative FFR and medical management initially recommended given large diagonal branch   Cancer (HCC) 07/2012   prostate   Heart disease    Hypertension     Past Surgical History:  Procedure Laterality Date   COLONOSCOPY N/A 08/28/2014   Procedure: COLONOSCOPY;  Surgeon: Oneil Budge, MD;  Location: [facility];  Service: Gastroenterology;  Laterality: N/A;   CORONARY PRESSURE/FFR STUDY N/A 11/04/2017   Procedure: INTRAVASCULAR PRESSURE WIRE/DFR vs FFR  STUDY;  Surgeon: Burnard Debby LABOR, MD;  Location: [facility];  Service:  Cardiovascular;  Laterality: N/A;   CORONARY PRESSURE/FFR STUDY N/A 10/02/2019   Procedure: INTRAVASCULAR PRESSURE WIRE/FFR STUDY;  Surgeon: Mady Bruckner, MD;  Location: [facility];  Service: Cardiovascular;  Laterality: N/A;   CORONARY STENT INTERVENTION N/A 10/02/2019   Procedure: CORONARY STENT INTERVENTION;  Surgeon: Mady Bruckner, MD;  Location: [facility];  Service: Cardiovascular;  Laterality: N/A;   LEFT HEART CATH AND CORONARY ANGIOGRAPHY N/A 11/04/2017   Procedure: LEFT HEART CATH AND CORONARY ANGIOGRAPHY;  Surgeon: Burnard Debby LABOR, MD;  Location: [facility];  Service: Cardiovascular;  Laterality: N/A;   LEFT HEART CATH AND CORONARY ANGIOGRAPHY N/A 10/02/2019   Procedure: LEFT HEART CATH AND CORONARY ANGIOGRAPHY;  Surgeon: Mady Bruckner, MD;  Location: [facility];  Service: Cardiovascular;  Laterality: N/A;   PROSTATE SURGERY  12/2012   SHOULDER SURGERY Bilateral     Family History  Problem Relation Age of Onset   Stroke Mother    Heart attack Father    Stroke Father    Kidney disease Brother     Social History:  reports that he quit smoking about 47 years ago. His smoking use included cigarettes. He started smoking about 65 years ago. He has a 4.5 pack-year smoking history. He quit smokeless tobacco use about 44 years ago. He reports that he does not drink alcohol and does not use drugs.  Allergies:  Allergies  Allergen Reactions   Penicillins     Eye swelled shut  .Has patient had a PCN reaction causing immediate rash, facial/tongue/throat swelling, SOB or lightheadedness with hypotension: Yes Has patient  had a PCN reaction causing severe rash involving mucus membranes or skin necrosis: No Has patient had a PCN reaction that required hospitalization: No Has patient had a PCN reaction occurring within the last 10 years: No If all of the above answers are NO, then may proceed with Cephalosporin use    Prior to Admission medications    Medication Sig Start Date End Date Taking? Authorizing Provider  amLODipine  (NORVASC ) 10 MG tablet Take 1 tablet (10 mg total) by mouth daily. 04/29/20  Yes BranchDorn FALCON, MD  aspirin  81 MG tablet Take 81 mg by mouth daily.   Yes [provider]  levothyroxine (SYNTHROID) 50 MCG tablet Take 50 mcg by mouth daily. 03/25/23  Yes [provider]  losartan  (COZAAR ) 100 MG tablet Take 1 tablet by mouth once daily 11/18/20  Yes Branch, Dorn FALCON, MD  metoprolol  succinate (TOPROL -XL) 25 MG 24 hr tablet Take 0.5 tablets (12.5 mg total) by mouth daily. 01/19/22  Yes BranchDorn FALCON, MD  rosuvastatin  (CRESTOR ) 5 MG tablet Take 1 tablet by mouth once daily 09/15/21  Yes Branch, Dorn FALCON, MD  spironolactone  (ALDACTONE ) 25 MG tablet Take 25 mg by mouth daily.   Yes [provider]  furosemide  (LASIX ) 20 MG tablet Take 1 tablet (20 mg total) by mouth daily. Patient not taking: Reported on 12/29/2023 06/25/23 12/29/23  Furth, Cadence H, PA-C    Blood pressure (!) 174/68, pulse 60, temperature 98 F (36.7 C), temperature source Oral, height 5' 6 (1.676 m), weight 228 lb (103.4 kg). Exam: General: Communicates without difficulty, well nourished, no acute distress. Head: Normocephalic, no evidence injury, no tenderness, facial buttresses intact without stepoff. Face/sinus: No tenderness to palpation and percussion. Facial movement is normal and symmetric. Eyes: PERRL, EOMI. No scleral icterus, conjunctivae clear. Neuro: CN II exam reveals vision grossly intact.  No nystagmus at any point of gaze. Ears: Auricles well formed without lesions.  Ear canals are intact without mass or lesion.  No erythema or edema is appreciated.  The TMs are intact without fluid. Nose: External evaluation reveals normal support and skin without lesions.  Dorsum is intact.  Anterior rhinoscopy reveals congested mucosa over anterior aspect of inferior turbinates and deviated septum.  No purulence noted.  Oral:  Oral cavity and oropharynx are intact, symmetric, without erythema or edema.  Mucosa is moist without lesions. Neck: Full range of motion without pain.  There is no significant lymphadenopathy.  No masses palpable.  Thyroid  bed within normal limits to palpation.  Parotid glands and submandibular glands equal bilaterally without mass.  Trachea is midline. Neuro:  CN 2-12 grossly intact.   Procedure:  Flexible Nasal Endoscopy: Description: Risks, benefits, and alternatives of flexible endoscopy were explained to the patient.  Specific mention was made of the risk of throat numbness with difficulty swallowing, possible bleeding from the nose and mouth, and pain from the procedure.  The patient gave oral consent to proceed.  The flexible scope was inserted into the right nasal cavity.  Endoscopy of the interior nasal cavity, superior, inferior, and middle meatus was performed. The sphenoid-ethmoid recess was examined. Edematous mucosa was noted.  No polyp, mass, or lesion was appreciated. Nasal septal deviation noted. Olfactory cleft was clear.  Nasopharynx was clear.  Turbinates were hypertrophied but without mass.  The procedure was repeated on the contralateral side with similar findings.  The patient tolerated the procedure well.   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 No polyps or infections. Previous steroid nasal sprays ineffective.  More than 95% of his nasal passageways are obstructed bilaterally. - The physical exam and nasal endoscopy findings are reviewed with the patient. -In light of his persistent nasal obstructions, he will likely benefit from surgical intervention with septoplasty and bilateral inferior turbinate reduction.  The risks, benefits, alternatives, and details of the procedures are  extensively discussed.  Questions are invited and answered. - The patient would like to proceed with the procedures. - Continue with daily nasal saline irrigation. - Instructed to stop baby aspirin  one week prior to surgery.    Kashina Mecum W Salomon Ganser 12/29/2023, 2:33 PM

## 2024-01-04 NOTE — Progress Notes (Unsigned)
 Cardiology Office Note    Date:  01/05/2024  ID:  [name], DOB 1945-08-12, MRN [id number] Cardiologist: Alvan Carrier, MD { :  History of Present Illness:    [name] is a 78 y.o. male with past medical history of CAD (s/p cath in 10/2017 which showed eccentric stenosis along the proximal LAD which appeared to have 30% stenosis except RAO cranial view and appeared to have 75% stenosis with discordant mildly positive DFR and negative FFR and medical management initially recommended given large diagonal branch, DES to LAD in 09/2019), HTN, HLD, hypertrophic cardiomyopathy and OSA (intolerant to CPAP) who presents to the office today for 28-month follow-up.  He was last examined by Cadence Furth, PA in 05/2023 and reported occasional episodes of nausea and headaches but denied any chest pain or progressive dyspnea on exertion. He had stopped utilizing his CPAP many years ago. BP was elevated and he was encouraged to follow this at home. Given his hypertrophic cardiomyopathy, his diuretic was not further titrated and his heart rate in the 50's limited further titration of beta-blocker therapy. Lifestyle modification was recommended. He was continued on Amlodipine  10 mg daily, ASA 81 mg daily, HCTZ 12.5 mg daily, Losartan  100 mg daily, Toprol -XL 12.5 mg daily, Crestor  5 mg daily and Spironolactone  25 mg daily. A follow-up echocardiogram was recommended for further assessment.  His echocardiogram showed a hyperdynamic LVEF of 70 to 75% and peak LVOT gradient at 42 mmHg at rest with severe septal hypertrophy. RV function was normal and he did not have any significant valve abnormalities. LVEDP was elevated and a BNP was obtained and elevated at 301. Therefore, HCTZ was discontinued and he was switched to Lasix  20 mg daily.  In talking with the patient today, he reports things have overall been stable since his last office visit. He was recently diagnosed with a deviated septum by ENT  and is planning to undergo a surgical procedure for this in the next few weeks. He is already holding ASA for this. He remains active around his home and has been recently getting up leaves. He denies any chest pain or dyspnea on exertion with this. Does note shortness of breath when walking up an incline to his mailbox and reports this happens sporadically but is not consistent. No specific orthopnea, PND or pitting edema. He is unsure of which medications he is currently taking but believes Toprol -XL was adjusted by his PCP and he is not sure if he is taking Crestor . He does report trying to stay hydrated and consumes at least 3 bottles of water  a day along with other beverages and no alcohol intake  Does add salt to food routinely.  Studies Reviewed:   EKG: EKG is ordered today and demonstrates:   EKG Interpretation Date/Time:  Wednesday January 05 2024 13:35:17 EST Ventricular Rate:  54 PR Interval:  190 QRS Duration:  102 QT Interval:  462 QTC Calculation: 438 R Axis:   8  Text Interpretation: Sinus bradycardia Incomplete right bundle branch block Left ventricular hypertrophy with repolarization abnormality ( R in aVL ) When compared with ECG of 03-Oct-2019 05:25, No significant change was found Confirmed by Johnson Grate ([id number]) on 01/05/2024 1:47:57 PM       Cardiac Catheterization: 09/2019 Conclusions: Severe single vessel coronary artery disease with 80% proximal LAD stenosis at takeoff of large D1 branch. Moderate, nonobstructive ostial RCA disease (DFR = 0.97). Hyperdynamic left ventricular contraction with dynamic mid-cavity gradient (post-PVC gradient up to ~110 mmHg).  Mildly elevated left ventricular filling pressure (LVEDP 15-20 mmHg). Successful PCI to proximal LAD using Resolute Onyx 3.0 x 15 mm DES with 0% residual stenosis and TIMI-3 flow.   Recommendations: Overnight extended recovery. Dual antiplatelet therapy with aspirin  and clopidogrel  for at least 6  months. Aggressive secondary prevention. Hold isosorbide  mononitrate and add metoprolol  succinate 12.5 mg daily for BP and heart rate control in the setting of possible HOCM. Consider repeat outpatient echo versus cardiac MRI for further evaluation of possible HOCM.  Echocardiogram: 06/2023 IMPRESSIONS     1. Peak LVOT gradient is 42 mm Hg at rest. Severe septal hypertrophy.  Left ventricular ejection fraction, by estimation, is 70 to 75%. The left  ventricle has hyperdynamic function. The left ventricle has no regional  wall motion abnormalities. There is  severe asymmetric left ventricular hypertrophy of the septal segment. Left  ventricular diastolic parameters are indeterminate. Elevated left  ventricular end-diastolic pressure.   2. Right ventricular systolic function is normal. The right ventricular  size is normal. Tricuspid regurgitation signal is inadequate for assessing  PA pressure.   3. Left atrial size was mildly dilated.   4. The mitral valve is grossly normal. No evidence of mitral valve  regurgitation. No evidence of mitral stenosis.   5. The aortic valve is tricuspid. There is mild calcification of the  aortic valve. Aortic valve regurgitation is trivial. No aortic stenosis is  present.   Physical Exam:   VS:  BP 132/72   Pulse (!) 57   Ht 5' 6 (1.676 m)   Wt 229 lb 9.6 oz (104.1 kg)   SpO2 94%   BMI 37.06 kg/m    Wt Readings from Last 3 Encounters:  01/05/24 229 lb 9.6 oz (104.1 kg)  12/29/23 228 lb (103.4 kg)  05/31/23 221 lb (100.2 kg)     GEN: Well nourished, well developed male appearing in no acute distress NECK: No JVD; No carotid bruits CARDIAC: RRR, 2/6 systolic murmur along right upper sternal border.  RESPIRATORY:  Clear to auscultation without rales, wheezing or rhonchi  ABDOMEN: Appears non-distended. No obvious abdominal masses. EXTREMITIES: No clubbing or cyanosis. No pitting edema.  Distal pedal pulses are 2+ bilaterally.   Assessment  and Plan:   1. Coronary artery disease of native artery of native heart with stable angina pectoris - He is s/p DES placement to the LAD in 09/2019 with moderate, nonobstructive disease along the RCA. He has intermittent dyspnea on exertion but no specific exertional chest pain. We reviewed warning signs to monitor for and we can arrange for follow-up ischemic evaluation if he has progressive symptoms. - Continue ASA 81 mg daily, Toprol -XL 12.5 mg daily and Crestor  5 mg daily (will verify if taking).  2. Hypertrophic cardiomyopathy (HCC) - Most recent echocardiogram in 06/2023 showed a hyperdynamic LVEF of 70 to 75% with severe septal hypertrophy and peak LVOT gradient at 42 mmHg. Respiratory status has overall been stable and he denies any specific palpitations. Continue Toprol -XL 12.5 mg daily. Unable to further titrate given his heart rate in the 50's. If he were to develop palpitations or worsening shortness of breath, would arrange for cMRI. Importance of hydration was reviewed.  3. Essential hypertension - BP was initially elevated at 144/66, rechecked and improved to 132/72 during today's visit. Continue current medical therapy with Amlodipine  10 mg daily, Losartan  100 mg daily, Toprol -XL 12.5 mg daily and Spironolactone  25 mg daily. Was encouraged to limit sodium intake in his diet.  4. Hyperlipidemia,  mixed - LDL was at 72 in 09/2022. He did have recent labs by his PCP earlier this month and we will request a copy. I have asked him to bring a list of medications to the office to verify if he is still taking Crestor  5 mg once daily. He was previously intolerant to Atorvastatin .  Signed, Laymon CHRISTELLA Qua, PA-C

## 2024-01-05 ENCOUNTER — Ambulatory Visit: Attending: Student | Admitting: Student

## 2024-01-05 ENCOUNTER — Encounter: Payer: Self-pay | Admitting: Student

## 2024-01-05 ENCOUNTER — Encounter: Payer: Self-pay | Admitting: *Deleted

## 2024-01-05 VITALS — BP 132/72 | HR 57 | Ht 66.0 in | Wt 229.6 lb

## 2024-01-05 DIAGNOSIS — E782 Mixed hyperlipidemia: Secondary | ICD-10-CM

## 2024-01-05 DIAGNOSIS — I422 Other hypertrophic cardiomyopathy: Secondary | ICD-10-CM | POA: Diagnosis not present

## 2024-01-05 DIAGNOSIS — I10 Essential (primary) hypertension: Secondary | ICD-10-CM | POA: Diagnosis not present

## 2024-01-05 DIAGNOSIS — I25118 Atherosclerotic heart disease of native coronary artery with other forms of angina pectoris: Secondary | ICD-10-CM | POA: Diagnosis not present

## 2024-01-05 NOTE — Patient Instructions (Signed)
 Medication Instructions:   Please bring a current list of your medications or your medications to the office next week for review.   *If you need a refill on your cardiac medications before your next appointment, please call your pharmacy*  Lab Work: NONE   If you have labs (blood work) drawn today and your tests are completely normal, you will receive your results only by: MyChart Message (if you have MyChart) OR A paper copy in the mail If you have any lab test that is abnormal or we need to change your treatment, we will call you to review the results.  Testing/Procedures: NONE   Follow-Up: At [facility], you and your health needs are our priority.  As part of our continuing mission to provide you with exceptional heart care, our providers are all part of one team.  This team includes your primary Cardiologist (physician) and Advanced Practice Providers or APPs (Physician Assistants and Nurse Practitioners) who all work together to provide you with the care you need, when you need it.  Your next appointment:   6 month(s)  Provider:   You may see Alvan Carrier, MD or one of the following Advanced Practice Providers on your designated Care Team:   Laymon Qua, PA-C  [place], [place] Olivia [place], [place]     We recommend signing up for the patient portal called MyChart.  Sign up information is provided on this After Visit Summary.  MyChart is used to connect with patients for Virtual Visits (Telemedicine).  Patients are able to view lab/test results, encounter notes, upcoming appointments, etc.  Non-urgent messages can be sent to your provider as well.   To learn more about what you can do with MyChart, go to [email or web address].   Other Instructions Thank you for choosing [facility]!

## 2024-01-12 ENCOUNTER — Other Ambulatory Visit: Payer: Self-pay

## 2024-02-21 ENCOUNTER — Telehealth (INDEPENDENT_AMBULATORY_CARE_PROVIDER_SITE_OTHER): Payer: Self-pay | Admitting: Otolaryngology

## 2024-02-21 ENCOUNTER — Telehealth (HOSPITAL_BASED_OUTPATIENT_CLINIC_OR_DEPARTMENT_OTHER): Payer: Self-pay

## 2024-02-21 ENCOUNTER — Other Ambulatory Visit: Payer: Self-pay

## 2024-02-21 NOTE — Progress Notes (Signed)
 Patient stated that he started with head congestion and a productive cough around 1/5 and still has a productive cough, congestion, and scratchy through.  Per anesthesia guidelines, patient will need to be rescheduled.    Patient also did not receive instructions on when to stop his Aspirin .     Left message at Dr. Rojean office and sent message to Dr. Karis.

## 2024-02-21 NOTE — Telephone Encounter (Signed)
 Medical/ surgical clearance was faxed to [facility]( Dr. Alvan) regarding stopping Plavix  and Aspirin . Fax# [phone]. Phone: [phone].

## 2024-02-21 NOTE — Telephone Encounter (Signed)
 Patient is scheduled on 03/22/24 for surgery.  He needs instructions on when to stop Plavix  and aspirin 

## 2024-02-21 NOTE — Telephone Encounter (Signed)
" ° °  Name: [name]  DOB: 1945-05-24  MRN: [id number]  Primary Cardiologist: Alvan Carrier, MD   Preoperative team, please contact this patient and set up a phone call appointment for further preoperative risk assessment. Please obtain consent and complete medication review. Thank you for your help.  I confirm that guidance regarding antiplatelet and oral anticoagulation therapy has been completed and, if necessary, noted below.  Per office protocol, if patient is without any new symptoms or concerns at the time of their virtual visit, he may hold Plavix  for 5 days prior to procedure. Please resume Plavix  as soon as possible postprocedure, at the discretion of the surgeon.    I also confirmed the patient resides in the state of [place] . As per [facility] Medical Board telemedicine laws, the patient must reside in the state in which the provider is licensed.   Lamarr Satterfield, NP 02/21/2024, 4:17 PM [facility]    "

## 2024-02-21 NOTE — Telephone Encounter (Signed)
"  ° °  Pre-operative Risk Assessment    Patient Name: [name]  DOB: 1945/10/22 MRN: [id number]   Date of last office visit: 01/05/2024 with Laymon Qua, PA-C Date of next office visit: None  Request for Surgical Clearance    Procedure:  Septoplasty, bilateral turbinate reduction  Date of Surgery:  Clearance 03/22/24                                 Surgeon:  Dr. Karis Socks Group or Practice Name:  [facility]  Phone number:  [phone] Fax number:  [phone]   Type of Clearance Requested:   - Medical  - Pharmacy:  Hold Clopidogrel  (Plavix ) and Aspirin  (ASA not on med list)    Type of Anesthesia:  General    Additional requests/questions:  length of surgery - 1.5 hours  Signed, Patrcia Iverson CROME   02/21/2024, 4:09 PM    "

## 2024-02-22 ENCOUNTER — Telehealth (HOSPITAL_BASED_OUTPATIENT_CLINIC_OR_DEPARTMENT_OTHER): Payer: Self-pay

## 2024-02-22 NOTE — Telephone Encounter (Signed)
 Patient is scheduled for a tele visit on 03/14/24 with Jessie Cleaver, NP. Med rec and consent are done.... CB

## 2024-02-22 NOTE — Telephone Encounter (Signed)
 Patient is scheduled for a tele visit on 03/14/24 with Jessie Cleaver, NP. Med rec and consent are done.... CB    Patient Consent for Virtual Visit        [name] has provided verbal consent on 02/22/2024 for a virtual visit (video or telephone).   CONSENT FOR VIRTUAL VISIT FOR:  [name]  By participating in this virtual visit I agree to the following:  I hereby voluntarily request, consent and authorize [facility] and its employed or contracted physicians, physician assistants, nurse practitioners or other licensed health care professionals (the Practitioner), to provide me with telemedicine health care services (the Services) as deemed necessary by the treating Practitioner. I acknowledge and consent to receive the Services by the Practitioner via telemedicine. I understand that the telemedicine visit will involve communicating with the Practitioner through live audiovisual communication technology and the disclosure of certain medical information by electronic transmission. I acknowledge that I have been given the opportunity to request an in-person assessment or other available alternative prior to the telemedicine visit and am voluntarily participating in the telemedicine visit.  I understand that I have the right to withhold or withdraw my consent to the use of telemedicine in the course of my care at any time, without affecting my right to future care or treatment, and that the Practitioner or I may terminate the telemedicine visit at any time. I understand that I have the right to inspect all information obtained and/or recorded in the course of the telemedicine visit and may receive copies of available information for a reasonable fee.  I understand that some of the potential risks of receiving the Services via telemedicine include:  Delay or interruption in medical evaluation due to technological equipment failure or disruption; Information transmitted may  not be sufficient (e.g. poor resolution of images) to allow for appropriate medical decision making by the Practitioner; and/or  In rare instances, security protocols could fail, causing a breach of personal health information.  Furthermore, I acknowledge that it is my responsibility to provide information about my medical history, conditions and care that is complete and accurate to the best of my ability. I acknowledge that Practitioner's advice, recommendations, and/or decision may be based on factors not within their control, such as incomplete or inaccurate data provided by me or distortions of diagnostic images or specimens that may result from electronic transmissions. I understand that the practice of medicine is not an exact science and that Practitioner makes no warranties or guarantees regarding treatment outcomes. I acknowledge that a copy of this consent can be made available to me via my patient portal [facility] MyChart), or I can request a printed copy by calling the office of [facility].    I understand that my insurance will be billed for this visit.   I have read or had this consent read to me. I understand the contents of this consent, which adequately explains the benefits and risks of the Services being provided via telemedicine.  I have been provided ample opportunity to ask questions regarding this consent and the Services and have had my questions answered to my satisfaction. I give my informed consent for the services to be provided through the use of telemedicine in my medical care

## 2024-02-25 ENCOUNTER — Telehealth (INDEPENDENT_AMBULATORY_CARE_PROVIDER_SITE_OTHER): Payer: Self-pay | Admitting: Otolaryngology

## 2024-02-28 NOTE — Telephone Encounter (Signed)
 Patient is scheduled for a tele visit on 03/14/24 with Albino Beauvais, NP to discuss preoperative risk assessment  and medication review about Plavix  and Aspirin  prior to upcoming surgery ( Septoplasty and Bilateral Turbinate Reduction ) with Dr. Karis on 03/22/2024

## 2024-03-14 ENCOUNTER — Telehealth (INDEPENDENT_AMBULATORY_CARE_PROVIDER_SITE_OTHER): Payer: Self-pay | Admitting: Otolaryngology

## 2024-03-14 ENCOUNTER — Ambulatory Visit

## 2024-03-14 DIAGNOSIS — Z01810 Encounter for preprocedural cardiovascular examination: Secondary | ICD-10-CM | POA: Diagnosis not present

## 2024-03-14 DIAGNOSIS — Z01818 Encounter for other preprocedural examination: Secondary | ICD-10-CM

## 2024-03-14 NOTE — Telephone Encounter (Signed)
 Called and left patient a voice mail. Per Dr. Karis, please do not take more aspirin  and  his last dose of Plavix  should be on 03/16/24.

## 2024-03-15 ENCOUNTER — Telehealth: Payer: Self-pay | Admitting: Cardiology

## 2024-03-15 ENCOUNTER — Telehealth (INDEPENDENT_AMBULATORY_CARE_PROVIDER_SITE_OTHER): Payer: Self-pay | Admitting: Otolaryngology

## 2024-03-15 NOTE — Telephone Encounter (Signed)
 Timea states provider for upcoming procedure won follow thru with it if pt is still taking aspirin . She would like a c.b regarding this matter. Please advise.

## 2024-03-15 NOTE — Telephone Encounter (Signed)
 Sp[facility] to [phone]) and left massage for for the providerETTER Mardy Pizza FNP) regarding patient medication management prior to surgery on 03/22/2024 Dr. Karis stated that he will not do  the surgery if patient is taking aspirin .

## 2024-03-16 NOTE — Telephone Encounter (Signed)
" ° °  Patient Name: [name]  DOB: 1945-08-20 MRN: [id number]  Primary Cardiologist: Alvan Carrier, MD  Chart reviewed as part of pre-operative protocol coverage. Pre-op clearance already addressed by colleagues in earlier phone notes. To summarize recommendations:  From office visit 03/14/2024:  -Per office protocol, if patient is without any new symptoms or concerns at the time of their virtual visit, he may hold Plavix  for 5 days prior to procedure. We do recommend taking aspirin  in the interm of stopping Plavix . Please resume Plavix  as soon as possible postprocedure, at the discretion of the surgeon.   Will route this bundled recommendation to requesting provider via Epic fax function and remove from pre-op pool. Please call with questions.  Orren LOISE Fabry, PA-C 03/16/2024, 8:56 AM  "

## 2024-03-22 ENCOUNTER — Encounter (HOSPITAL_COMMUNITY): Payer: Self-pay | Admitting: Physician Assistant

## 2024-03-22 ENCOUNTER — Ambulatory Visit (HOSPITAL_COMMUNITY): Admission: RE | Admit: 2024-03-22 | Source: Home / Self Care | Admitting: Otolaryngology

## 2024-03-22 ENCOUNTER — Encounter (HOSPITAL_COMMUNITY): Admission: RE | Payer: Self-pay | Source: Home / Self Care

## 2024-03-24 ENCOUNTER — Encounter (INDEPENDENT_AMBULATORY_CARE_PROVIDER_SITE_OTHER): Admitting: Otolaryngology
